# Patient Record
Sex: Male | Born: 1950 | Race: White | Hispanic: No | Marital: Married | Smoking: Never smoker
Health system: Southern US, Community
[De-identification: ages and names within clinical notes are randomized; demographics above are authoritative.]

## PROBLEM LIST (undated history)

## (undated) DIAGNOSIS — R7301 Impaired fasting glucose: Secondary | ICD-10-CM

## (undated) DIAGNOSIS — R339 Retention of urine, unspecified: Secondary | ICD-10-CM

## (undated) DIAGNOSIS — E785 Hyperlipidemia, unspecified: Secondary | ICD-10-CM

## (undated) DIAGNOSIS — Z978 Presence of other specified devices: Secondary | ICD-10-CM

## (undated) DIAGNOSIS — M109 Gout, unspecified: Secondary | ICD-10-CM

## (undated) DIAGNOSIS — Z960 Presence of urogenital implants: Secondary | ICD-10-CM

## (undated) DIAGNOSIS — K635 Polyp of colon: Secondary | ICD-10-CM

## (undated) DIAGNOSIS — I10 Essential (primary) hypertension: Secondary | ICD-10-CM

## (undated) HISTORY — PX: KNEE SURGERY: SHX244

## (undated) HISTORY — DX: Gout, unspecified: M10.9

## (undated) HISTORY — DX: Polyp of colon: K63.5

## (undated) HISTORY — DX: Hyperlipidemia, unspecified: E78.5

## (undated) HISTORY — DX: Essential (primary) hypertension: I10

## (undated) HISTORY — DX: Impaired fasting glucose: R73.01

---

## 2002-02-08 ENCOUNTER — Encounter: Payer: Self-pay | Admitting: Family Medicine

## 2002-02-08 ENCOUNTER — Ambulatory Visit (HOSPITAL_COMMUNITY): Admission: RE | Admit: 2002-02-08 | Discharge: 2002-02-08 | Payer: Self-pay | Admitting: Family Medicine

## 2003-01-09 ENCOUNTER — Ambulatory Visit (HOSPITAL_COMMUNITY): Admission: RE | Admit: 2003-01-09 | Discharge: 2003-01-09 | Payer: Self-pay | Admitting: Family Medicine

## 2003-01-09 ENCOUNTER — Encounter: Payer: Self-pay | Admitting: Family Medicine

## 2003-02-07 ENCOUNTER — Ambulatory Visit (HOSPITAL_BASED_OUTPATIENT_CLINIC_OR_DEPARTMENT_OTHER): Admission: RE | Admit: 2003-02-07 | Discharge: 2003-02-07 | Payer: Self-pay | Admitting: Orthopedic Surgery

## 2005-02-16 ENCOUNTER — Emergency Department (HOSPITAL_COMMUNITY): Admission: EM | Admit: 2005-02-16 | Discharge: 2005-02-16 | Payer: Self-pay | Admitting: Emergency Medicine

## 2006-07-04 DIAGNOSIS — K635 Polyp of colon: Secondary | ICD-10-CM

## 2006-07-04 HISTORY — DX: Polyp of colon: K63.5

## 2007-04-25 ENCOUNTER — Encounter: Payer: Self-pay | Admitting: Internal Medicine

## 2007-04-25 ENCOUNTER — Ambulatory Visit: Payer: Self-pay | Admitting: Internal Medicine

## 2007-04-25 ENCOUNTER — Ambulatory Visit (HOSPITAL_COMMUNITY): Admission: RE | Admit: 2007-04-25 | Discharge: 2007-04-25 | Payer: Self-pay | Admitting: Internal Medicine

## 2010-11-16 NOTE — Op Note (Signed)
NAME:  Miguel Solis, Miguel Solis NO.:  [id number]   MEDICAL RECORD NO.:  [id number]          PATIENT TYPE:  AMB   LOCATION:  DAY                           FACILITY:  [facility]   PHYSICIAN:  R. Roetta Sessions, M.D. DATE OF BIRTH:  January 20, 1951   DATE OF PROCEDURE:  04/25/2007  DATE OF DISCHARGE:                               OPERATIVE REPORT   PROCEDURE PERFORMED:  Screening colonoscopy with biopsy.   INDICATIONS FOR PROCEDURE:  The patient is a 60 year old Caucasian male  devoid of any GI symptoms sent over at the courtesy of Dr. Lilyan Punt  for colorectal cancer screening.  He has never had his lower GI tract  imaged.  There is no family history of colorectal neoplasia.  Colonoscopy is done as a standard screening maneuver.  This approach has  been reviewed with the patient. We talked about the potential risks,  benefits, and alternatives.  I explained to [name] although screening  colonoscopy is an excellent procedure for colorectal cancer screening,  no test is 100% effective in preventing colon cancer.  His questions  were answered.  He is agreeable.  Please see documentation in the  medical record.   PROCEDURE NOTE:  O2 saturation, blood pressure, and pulse were monitored  throughout the entire procedure.  Conscious sedation with Versed 3 mg IV  and Demerol 50 mg IV in divided doses.  Instrument Pentax video chip  system.   FINDINGS:  Digital rectal exam revealed no abnormalities.  The prep was  adequate.  Colon:  Colonic mucosa was surveyed from the rectosigmoid junction  through the left transverse, right colon to the area of the appendiceal  orifice and ileocecal valve to the cecum.  These structures were well  seen and photographed for the record.  From this level, the scope was  slowly withdrawn.  All previously mentioned mucosal surfaces were again  seen. Careful inspection of the colonic mucosa was undertaken with the  tip deflection and fold flattening. The  patient was noted have a 4 mm  polyp in the ascending colon which was cold biopsied/removed.  The  remainder of the colonic mucosa appeared normal.  The scope was pulled  down in the rectum.  A thorough examination of the rectal mucosa  including retroflexion of the anal verge demonstrated no abnormalities.  The patient tolerated the procedure well and was reactive in endoscopy.   IMPRESSION:  1. Normal rectum.  2. Diminutive ascending colon polyp, cold biopsied/removed.  3. The remainder of the colonic mucosa appeared normal.   RECOMMENDATIONS:  1. Follow up path.  2. Further recommendations to follow.      Jonathon Bellows, M.D.  Electronically Signed     RMR/MEDQ  D:  04/25/2007  T:  04/25/2007  Job:  [id number]   cc:   Lorin Picket A. Gerda Diss, MD  Fax: [phone]

## 2010-11-19 NOTE — Op Note (Signed)
NAME:  Miguel Solis, Miguel Solis NO.:  [id number]   MEDICAL RECORD NO.:  [id number]                   PATIENT TYPE:  AMB   LOCATION:  DSC                                  FACILITY:  [facility]   PHYSICIAN:  Harvie Junior, M.D.                DATE OF BIRTH:  04/21/1951   DATE OF PROCEDURE:  02/07/2003  DATE OF DISCHARGE:                                 OPERATIVE REPORT   PREOPERATIVE DIAGNOSIS:  Medial meniscal tear.   POSTOPERATIVE DIAGNOSES:  1. Medial meniscal tear.  2. Chondromalacia of lateral patellar facet.   PRINCIPAL PROCEDURE:  1. Partial posterior horn medial meniscectomy.  2. Debridement of lateral patellar facet.   SURGEON:  Harvie Junior, M.D.   ANESTHESIA:  General.   BRIEF HISTORY:  He is a 60 year old male with a history of having  significant medial side knee pain and anterior knee pain.  He was seen by  Dr. Gerda Diss and felt to have a medial meniscal tear.  MRI was obtained which  showed this, and he was sent down for evaluation.  After discussions about  treatment options, he was ultimately taken to the operating room for  operative knee arthroscopy.   DESCRIPTION OF PROCEDURE:  The patient was taken to the operating room and  after adequate anesthesia was obtained with a general anesthetic, the  patient was placed supine upon the operating table.  The right leg was then  prepped and draped in the usual sterile fashion.  Following this, routine  arthroscopic examination of the knee revealed that there was an obvious  posterior horn medial meniscal tear.  This was debrided back to a smooth and  stable rim.  The remaining meniscus was contoured down with a suction  shaver, and a probe was used to make sure the remaining rim was stable.  The  medial femoral condyle was without significant evidence of chondromalacia  with maybe some grade 2 changes.  Laterally there was one loose body, which  was identified and removed.  Attention was turned  up into the patellofemoral  joint, where there was a significant area of chondromalacia in the lateral  patellar facet, and this was debrided back to a smooth and stable rim.  Attention at this time was turned up into the pouch, where there were  multiple osteocartilaginous loose bodies and cartilaginous fragments.  These  were debrided.  A final check was made for any loose and fragmenting pieces  in the medial and lateral gutters.  Seeing  none, the knee was copiously irrigated and suctioned dry.  The arthroscopic  portals were closed with bandage, a sterile and compressive dressing was  applied, and the patient was taken to recovery, where he was noted to be in  satisfactory condition.  Estimated blood loss for the procedure was none.  Harvie Junior, M.D.    Ranae Plumber  D:  02/07/2003  T:  02/07/2003  Job:  [id number]   cc:   Donna Bernard, M.D.  [address]  [place]  [place] [address]  Fax: [phone]

## 2012-03-22 ENCOUNTER — Encounter: Payer: Self-pay | Admitting: *Deleted

## 2012-04-30 ENCOUNTER — Emergency Department (HOSPITAL_COMMUNITY): Payer: BC Managed Care – PPO

## 2012-04-30 ENCOUNTER — Encounter (HOSPITAL_COMMUNITY): Payer: Self-pay

## 2012-04-30 ENCOUNTER — Emergency Department (HOSPITAL_COMMUNITY)
Admission: EM | Admit: 2012-04-30 | Discharge: 2012-04-30 | Disposition: A | Discharge status: Home / Self Care | Payer: BC Managed Care – PPO | Attending: Emergency Medicine | Admitting: Emergency Medicine

## 2012-04-30 DIAGNOSIS — Y9389 Activity, other specified: Secondary | ICD-10-CM | POA: Insufficient documentation

## 2012-04-30 DIAGNOSIS — Z23 Encounter for immunization: Secondary | ICD-10-CM | POA: Insufficient documentation

## 2012-04-30 DIAGNOSIS — S62202A Unspecified fracture of first metacarpal bone, left hand, initial encounter for closed fracture: Secondary | ICD-10-CM

## 2012-04-30 DIAGNOSIS — S61012A Laceration without foreign body of left thumb without damage to nail, initial encounter: Secondary | ICD-10-CM

## 2012-04-30 DIAGNOSIS — S61209A Unspecified open wound of unspecified finger without damage to nail, initial encounter: Secondary | ICD-10-CM | POA: Insufficient documentation

## 2012-04-30 DIAGNOSIS — S62309A Unspecified fracture of unspecified metacarpal bone, initial encounter for closed fracture: Secondary | ICD-10-CM | POA: Insufficient documentation

## 2012-04-30 DIAGNOSIS — W268XXA Contact with other sharp object(s), not elsewhere classified, initial encounter: Secondary | ICD-10-CM | POA: Insufficient documentation

## 2012-04-30 DIAGNOSIS — Y929 Unspecified place or not applicable: Secondary | ICD-10-CM | POA: Insufficient documentation

## 2012-04-30 MED ORDER — TETANUS-DIPHTH-ACELL PERTUSSIS 5-2.5-18.5 LF-MCG/0.5 IM SUSP
0.5000 mL | Freq: Once | INTRAMUSCULAR | Status: AC
  Administered 2012-04-30: 0.5 mL via INTRAMUSCULAR
  Filled 2012-04-30: qty 0.5

## 2012-04-30 MED ORDER — CEPHALEXIN 500 MG PO CAPS
500.0000 mg | ORAL_CAPSULE | Freq: Once | ORAL | Status: AC
  Administered 2012-04-30: 500 mg via ORAL
  Filled 2012-04-30: qty 1

## 2012-04-30 MED ORDER — CEPHALEXIN 500 MG PO CAPS: 500.0000 mg | ORAL_CAPSULE | Freq: Four times a day (QID) | ORAL | Status: DC

## 2012-04-30 MED ORDER — OXYCODONE-ACETAMINOPHEN 5-325 MG PO TABS: 1.0000 | ORAL_TABLET | ORAL | Status: AC | PRN

## 2012-04-30 MED ORDER — LIDOCAINE HCL (PF) 2 % IJ SOLN
10.0000 mL | Freq: Once | INTRAMUSCULAR | Status: AC
  Administered 2012-04-30: 10 mL
  Filled 2012-04-30 (×2): qty 10

## 2012-04-30 NOTE — ED Notes (Signed)
Dressing applied after left hand and wound cleaned. Splint applied. Pt tolerated well.

## 2012-04-30 NOTE — ED Provider Notes (Signed)
History     CSN: [id number]  Arrival date & time 04/30/12  2130   First MD Initiated Contact with Patient 04/30/12 2027      Chief Complaint  Patient presents with  . Laceration  . Finger Injury    (Consider location/radiation/quality/duration/timing/severity/associated sxs/prior treatment) Patient is a 61 y.o. male presenting with skin laceration. The history is provided by the patient.  Laceration   He was using a cross flow and the strings that says he was trying to load it striking his left thumb causing a laceration. It is unknown his last tetanus immunization was. He denies other injury.  History reviewed. No pertinent past medical history.  Past Surgical History  Procedure Date  . Knee surgery     History reviewed. No pertinent family history.  History  Substance Use Topics  . Smoking status: Never Smoker   . Smokeless tobacco: Not on file  . Alcohol Use: No      Review of Systems  All other systems reviewed and are negative.    Allergies  Review of patient's allergies indicates no known allergies.  Home Medications  No current outpatient prescriptions on file.  BP 151/96  Pulse 96  Temp 98.1 F (36.7 C) (Oral)  Resp 20  Ht 5\' 10"  (1.778 m)  Wt 190 lb (86.183 kg)  BMI 27.26 kg/m2  SpO2 97%  Physical Exam  Nursing note and vitals reviewed. 61 year old male, resting comfortably and in no acute distress. Vital signs are significant for hypertension with blood pressure 151/96. Oxygen saturation is 97%, which is normal. Head is normocephalic and atraumatic. PERRLA, EOMI. Oropharynx is clear. Neck is nontender and supple without adenopathy or JVD. Back is nontender and there is no CVA tenderness. Lungs are clear without rales, wheezes, or rhonchi. Chest is nontender. Heart has regular rate and rhythm without murmur. Abdomen is soft, flat, nontender without masses or hepatosplenomegaly and peristalsis is normoactive. Extremities have no  cyanosis or edema. There is marked swelling of the thenar eminence of the left hand. There's a laceration on the ulnar and dorsal  aspect of the left thumb proximal phalanx. Neurovascular and tendon function are intact. Skin is warm and dry without rash. Neurologic: Mental status is normal, cranial nerves are intact, there are no motor or sensory deficits.   ED Course  SPLINT APPLICATION Date/Time: 04/30/2012 10:05 PM Performed by: Dione Booze Authorized by: Preston Fleeting, Tanysha Quant Consent: Verbal consent obtained. Written consent not obtained. Risks and benefits: risks, benefits and alternatives were discussed Consent given by: patient Patient understanding: patient states understanding of the procedure being performed Patient consent: the patient's understanding of the procedure matches consent given Procedure consent: procedure consent matches procedure scheduled Relevant documents: relevant documents present and verified Test results: test results available and properly labeled Site marked: the operative site was marked Imaging studies: imaging studies available Required items: required blood products, implants, devices, and special equipment available Patient identity confirmed: verbally with patient and arm band Time out: Immediately prior to procedure a "time out" was called to verify the correct patient, procedure, equipment, support staff and site/side marked as required. Location details: left thumb Splint type: velcro splint. Supplies used: velcro splint. Post-procedure: The splinted body part was neurovascularly unchanged following the procedure. Patient tolerance: Patient tolerated the procedure well with no immediate complications.   (including critical care time)  Labs Reviewed - No data to display Dg Finger Thumb Left  04/30/2012  *RADIOLOGY REPORT*  Clinical Data: Crossbow injury, pain, laceration  LEFT THUMB 2+V  Comparison: None.  Findings: Marked soft tissue swelling.   Small dorsal avulsion of the first metacarpal at the MCP joint suspected on the oblique view (arrow).  No dislocation.  Phalanges intact.  IMPRESSION: Small dorsal avulsion fracture of first metacarpal distally suspected.  See comments above.  Marked soft tissue swelling.   Original Report Authenticated By: Elsie Stain, M.D.    LACERATION REPAIR Performed by: Dione Booze Authorized by: Dione Booze Consent: Verbal consent obtained. Risks and benefits: risks, benefits and alternatives were discussed Consent given by: patient Patient identity confirmed: provided demographic data Prepped and Draped in normal sterile fashion Wound explored  Laceration Location: left thumb  Laceration Length: 4.5 cm  No Foreign Bodies seen or palpated, clot removed, no tendon injury  Anesthesia: local infiltration  Local anesthetic: lidocaine 2% without epinephrine - digital block supplemented with local infiltration  Anesthetic total: 12 ml  Irrigation method: syringe Amount of cleaning: standard  Skin closure: close  Number of sutures: 12  Technique: simple interrupted  Patient tolerance: Patient tolerated the procedure well with no immediate complications.   1. Laceration of left thumb   2. Fracture of first metacarpal of left hand       MDM  Laceration left thumb. He is given a DTaP booster and sent for x-ray which shows a probable avulsion of the first metacarpal. He is mildly tender over this area on exam, but the laceration is well removed from the possible fracture. You'll be placed on antibiotics from a prophylactic standpoint, but I do not believe that this is an open fracture. He is referred to the on-call hand specialist for followup after repair of laceration and he was placed in a thumb splint to properly mobilize the possible fracture        Dione Booze, MD 04/30/12 2207

## 2012-04-30 NOTE — ED Notes (Signed)
Cross bow hunting for deer this evening and the string hit it. Laceration to left thumb, bleeding at this time.

## 2012-04-30 NOTE — ED Notes (Signed)
Pt presents with left thumb laceration obtained while using crossbow during hunting. Bleeding controlled at this time as thumb is wrapped in guaze. Left hand cleansed with NS. Wound has clot at site. Clot was not disturbed while cleaning left hand. Pt tolerated cleaning well. Cap refill brisk distal to laceration with good feeling in tip of thumb.  Radial pulses present and equal bilaterally. Pt also has a large ecchymosis on posterior wrist. Pt denies pain in wrist at this time. Tetanus unknown at this time. Xray pending.

## 2012-05-08 MED FILL — Oxycodone w/ Acetaminophen Tab 5-325 MG: ORAL | Qty: 6 | Status: AC

## 2012-10-16 ENCOUNTER — Encounter: Payer: Self-pay | Admitting: Family Medicine

## 2012-10-16 ENCOUNTER — Ambulatory Visit (INDEPENDENT_AMBULATORY_CARE_PROVIDER_SITE_OTHER): Payer: BC Managed Care – PPO | Admitting: Family Medicine

## 2012-10-16 VITALS — BP 158/108 | Temp 98.8°F | Wt 205.0 lb

## 2012-10-16 DIAGNOSIS — J322 Chronic ethmoidal sinusitis: Secondary | ICD-10-CM

## 2012-10-16 MED ORDER — AZITHROMYCIN 250 MG PO TABS: ORAL_TABLET | ORAL | Status: DC

## 2012-10-16 NOTE — Progress Notes (Signed)
  Subjective:    Patient ID: Miguel Solis, male    DOB: [birth year], 62 y.o.   MRN: [id number]  Sinusitis This is a new problem. The current episode started in the past 7 days. The problem has been gradually worsening since onset. His pain is at a severity of 4/10. The pain is moderate. Associated symptoms include congestion, headaches, a hoarse voice, sinus pressure and sneezing. Past treatments include nothing. The treatment provided mild relief.      Review of Systems  HENT: Positive for congestion, hoarse voice, sneezing and sinus pressure.   Neurological: Positive for headaches.   otherwise negative     Objective:   Physical Exam   Alert mild malaise. Vitals reviewed. Frontal maxillary tenderness evident. Pharynx slight erythema Neck supple. Lungs clear. Heart regular in rhythm.    Assessment & Plan:  Impression #1 sinusitis. Plan Z-Pak. Symptomatic care discussed. WSL

## 2012-11-01 ENCOUNTER — Ambulatory Visit (INDEPENDENT_AMBULATORY_CARE_PROVIDER_SITE_OTHER): Payer: BC Managed Care – PPO | Admitting: Nurse Practitioner

## 2012-11-01 ENCOUNTER — Encounter: Payer: Self-pay | Admitting: Nurse Practitioner

## 2012-11-01 VITALS — BP 150/98 | HR 70 | Wt 206.0 lb

## 2012-11-01 DIAGNOSIS — S8012XA Contusion of left lower leg, initial encounter: Secondary | ICD-10-CM

## 2012-11-01 DIAGNOSIS — S8010XA Contusion of unspecified lower leg, initial encounter: Secondary | ICD-10-CM

## 2012-11-01 MED ORDER — NAPROXEN 500 MG PO TABS: 500.0000 mg | ORAL_TABLET | Freq: Two times a day (BID) | ORAL | Status: DC

## 2012-11-01 NOTE — Patient Instructions (Addendum)
Warm moist compresses; elevation;

## 2012-11-01 NOTE — Progress Notes (Signed)
Subjective:  Presents with complaints of swelling on the left lower leg just beneath the patella after turning over his lawnmower over on his leg about a week ago. Tenderness has resolved. Edema much improved. No chest pain or shortness of breath. Has been able to work and do normal activities.  Objective:   BP 150/98  Pulse 70  Wt 206 lb (93.441 kg)  BMI 29.56 kg/m2 NAD. Alert, oriented. Localized firm area of edema beneath the left knee in the pretibial soft tissue just lateral to be tibial. Area is approximately 4 cm in diameter, well-defined with minimal erythema, minimal warmth and nontender to palpation. Lower leg has 1+ pitting edema with some dependent ecchymoses noted around the foot. No tenderness with palpation of the calf or lower leg area. Good ROM of the ankle without tenderness, Homans sign negative. Strong DP pulse. Toes good capillary refill.  Assessment:Contusion of left lower leg, initial encounter  Hematoma of leg, left, initial encounter  Plan: Meds ordered this encounter  Medications  . naproxen (NAPROSYN) 500 MG tablet    Sig: Take 1 tablet (500 mg total) by mouth 2 (two) times daily with a meal. Prn pain    Dispense:  30 tablet    Refill:  0    Order Specific Question:  Supervising Provider    Answer:  Merlyn Albert [[id number]]   warm compresses and elevation. Expect continued gradual resolution of symptoms. Call back early next week if no improvement, sooner if any problems.

## 2012-11-05 ENCOUNTER — Telehealth: Payer: Self-pay | Admitting: Family Medicine

## 2012-11-05 NOTE — Telephone Encounter (Signed)
Please call patient.  How is swelling beneath knee and lower leg?  Any redness or warmth?  Any improvement?

## 2012-11-05 NOTE — Telephone Encounter (Signed)
Patient needs a referral to orthopedic for his knee-make for Wednesday please

## 2012-11-05 NOTE — Telephone Encounter (Signed)
Spoke with [employer].  Left message for patient to call Tuesday for appt for recheck.  Notified Nena Polio.

## 2012-11-05 NOTE — Telephone Encounter (Signed)
Patient stated it not much better and you told him if no better by today to call and you would refer him to ortho

## 2012-11-07 ENCOUNTER — Encounter: Payer: Self-pay | Admitting: *Deleted

## 2012-11-08 ENCOUNTER — Encounter: Payer: Self-pay | Admitting: Family Medicine

## 2012-11-08 ENCOUNTER — Ambulatory Visit (INDEPENDENT_AMBULATORY_CARE_PROVIDER_SITE_OTHER): Payer: BC Managed Care – PPO | Admitting: Family Medicine

## 2012-11-08 VITALS — BP 152/102 | Temp 98.6°F | Wt 205.2 lb

## 2012-11-08 DIAGNOSIS — R7309 Other abnormal glucose: Secondary | ICD-10-CM

## 2012-11-08 DIAGNOSIS — R739 Hyperglycemia, unspecified: Secondary | ICD-10-CM

## 2012-11-08 DIAGNOSIS — E785 Hyperlipidemia, unspecified: Secondary | ICD-10-CM

## 2012-11-08 DIAGNOSIS — Z0000 Encounter for general adult medical examination without abnormal findings: Secondary | ICD-10-CM

## 2012-11-08 DIAGNOSIS — I10 Essential (primary) hypertension: Secondary | ICD-10-CM

## 2012-11-08 DIAGNOSIS — T148XXA Other injury of unspecified body region, initial encounter: Secondary | ICD-10-CM

## 2012-11-08 MED ORDER — LISINOPRIL 5 MG PO TABS: 5.0000 mg | ORAL_TABLET | Freq: Every day | ORAL | Status: DC

## 2012-11-08 NOTE — Patient Instructions (Signed)
Diabetes Meal Planning Guide The diabetes meal planning guide is a tool to help you plan your meals and snacks. It is important for people with diabetes to manage their blood glucose (sugar) levels. Choosing the right foods and the right amounts throughout your day will help control your blood glucose. Eating right can even help you improve your blood pressure and reach or maintain a healthy weight. CARBOHYDRATE COUNTING MADE EASY When you eat carbohydrates, they turn to sugar. This raises your blood glucose level. Counting carbohydrates can help you control this level so you feel better. When you plan your meals by counting carbohydrates, you can have more flexibility in what you eat and balance your medicine with your food intake. Carbohydrate counting simply means adding up the total amount of carbohydrate grams in your meals and snacks. Try to eat about the same amount at each meal. Foods with carbohydrates are listed below. Each portion below is 1 carbohydrate serving or 15 grams of carbohydrates. Ask your dietician how many grams of carbohydrates you should eat at each meal or snack. Grains and Starches  1 slice bread.   English muffin or hotdog/hamburger bun.   cup cold cereal (unsweetened).   cup cooked pasta or rice.   cup starchy vegetables (corn, potatoes, peas, beans, winter squash).  1 tortilla (6 inches).   bagel.  1 waffle or pancake (size of a CD).   cup cooked cereal.  4 to 6 small crackers. *Whole grain is recommended. Fruit  1 cup fresh unsweetened berries, melon, papaya, pineapple.  1 small fresh fruit.   banana or mango.   cup fruit juice (4 oz unsweetened).   cup canned fruit in natural juice or water.  2 tbs dried fruit.  12 to 15 grapes or cherries. Milk and Yogurt  1 cup fat-free or 1% milk.  1 cup soy milk.  6 oz light yogurt with sugar-free sweetener.  6 oz low-fat soy yogurt.  6 oz plain yogurt. Vegetables  1 cup raw or  cup  cooked is counted as 0 carbohydrates or a "free" food.  If you eat 3 or more servings at 1 meal, count them as 1 carbohydrate serving. Other Carbohydrates   oz chips or pretzels.   cup ice cream or frozen yogurt.   cup sherbet or sorbet.  2 inch square cake, no frosting.  1 tbs honey, sugar, jam, jelly, or syrup.  2 small cookies.  3 squares of graham crackers.  3 cups popcorn.  6 crackers.  1 cup broth-based soup.  Count 1 cup casserole or other mixed foods as 2 carbohydrate servings.  Foods with less than 20 calories in a serving may be counted as 0 carbohydrates or a "free" food. You may want to purchase a book or computer software that lists the carbohydrate gram counts of different foods. In addition, the nutrition facts panel on the labels of the foods you eat are a good source of this information. The label will tell you how big the serving size is and the total number of carbohydrate grams you will be eating per serving. Divide this number by 15 to obtain the number of carbohydrate servings in a portion. Remember, 1 carbohydrate serving equals 15 grams of carbohydrate. SERVING SIZES Measuring foods and serving sizes helps you make sure you are getting the right amount of food. The list below tells how big or small some common serving sizes are.  1 oz.........4 stacked dice.  3 oz.........Deck of cards.  1 tsp........Tip   of little finger.  1 tbs......Marland KitchenMarland KitchenThumb.  2 tbs.......Marland KitchenGolf ball.   cup......Marland KitchenHalf of a fist.  1 cup.......Marland KitchenA fist. SAMPLE DIABETES MEAL PLAN Below is a sample meal plan that includes foods from the grain and starches, dairy, vegetable, fruit, and meat groups. A dietician can individualize a meal plan to fit your calorie needs and tell you the number of servings needed from each food group. However, controlling the total amount of carbohydrates in your meal or snack is more important than making sure you include all of the food groups at every  meal. You may interchange carbohydrate containing foods (dairy, starches, and fruits). The meal plan below is an example of a 2000 calorie diet using carbohydrate counting. This meal plan has 17 carbohydrate servings. Breakfast  1 cup oatmeal (2 carb servings).   cup light yogurt (1 carb serving).  1 cup blueberries (1 carb serving).   cup almonds. Snack  1 large apple (2 carb servings).  1 low-fat string cheese stick. Lunch  Chicken breast salad.  1 cup spinach.   cup chopped tomatoes.  2 oz chicken breast, sliced.  2 tbs low-fat [place] dressing.  12 whole-wheat crackers (2 carb servings).  12 to 15 grapes (1 carb serving).  1 cup low-fat milk (1 carb serving). Snack  1 cup carrots.   cup hummus (1 carb serving). Dinner  3 oz broiled salmon.  1 cup brown rice (3 carb servings). Snack  1  cups steamed broccoli (1 carb serving) drizzled with 1 tsp olive oil and lemon juice.  1 cup light pudding (2 carb servings). DIABETES MEAL PLANNING WORKSHEET Your dietician can use this worksheet to help you decide how many servings of foods and what types of foods are right for you.  BREAKFAST Food Group and Servings / Carb Servings Grain/Starches __________________________________ Dairy __________________________________________ Vegetable ______________________________________ Fruit ___________________________________________ Meat __________________________________________ Fat ____________________________________________ LUNCH Food Group and Servings / Carb Servings Grain/Starches ___________________________________ Dairy ___________________________________________ Fruit ____________________________________________ Meat ___________________________________________ Fat _____________________________________________ Laural [place] Food Group and Servings / Carb Servings Grain/Starches ___________________________________ Dairy  ___________________________________________ Fruit ____________________________________________ Meat ___________________________________________ Fat _____________________________________________ SNACKS Food Group and Servings / Carb Servings Grain/Starches ___________________________________ Dairy ___________________________________________ Vegetable _______________________________________ Fruit ____________________________________________ Meat ___________________________________________ Fat _____________________________________________ DAILY TOTALS Starches _________________________ Vegetable ________________________ Fruit ____________________________ Dairy ____________________________ Meat ____________________________ Fat ______________________________ Document Released: 03/17/2005 Document Revised: 09/12/2011 Document Reviewed: 01/26/2009 [employer] Patient Information 2013 [place], [place]. Hypertension As your heart beats, it forces blood through your arteries. This force is your blood pressure. If the pressure is too high, it is called hypertension (HTN) or high blood pressure. HTN is dangerous because you may have it and not know it. High blood pressure may mean that your heart has to work harder to pump blood. Your arteries may be narrow or stiff. The extra work puts you at risk for heart disease, stroke, and other problems.  Blood pressure consists of two numbers, a higher number over a lower, 110/72, for example. It is stated as "110 over 72." The ideal is below 120 for the top number (systolic) and under 80 for the bottom (diastolic). Write down your blood pressure today. You should pay close attention to your blood pressure if you have certain conditions such as:  Heart failure.  Prior heart attack.  Diabetes  Chronic kidney disease.  Prior stroke.  Multiple risk factors for heart disease. To see if you have HTN, your blood pressure should be measured while you are seated  with your arm held at the level of the heart. It  should be measured at least twice. A one-time elevated blood pressure reading (especially in the Emergency Department) does not mean that you need treatment. There may be conditions in which the blood pressure is different between your right and left arms. It is important to see your caregiver soon for a recheck. Most people have essential hypertension which means that there is not a specific cause. This type of high blood pressure may be lowered by changing lifestyle factors such as:  Stress.  Smoking.  Lack of exercise.  Excessive weight.  Drug/tobacco/alcohol use.  Eating less salt. Most people do not have symptoms from high blood pressure until it has caused damage to the body. Effective treatment can often prevent, delay or reduce that damage. TREATMENT  When a cause has been identified, treatment for high blood pressure is directed at the cause. There are a large number of medications to treat HTN. These fall into several categories, and your caregiver will help you select the medicines that are best for you. Medications may have side effects. You should review side effects with your caregiver. If your blood pressure stays high after you have made lifestyle changes or started on medicines,   Your medication(s) may need to be changed.  Other problems may need to be addressed.  Be certain you understand your prescriptions, and know how and when to take your medicine.  Be sure to follow up with your caregiver within the time frame advised (usually within two weeks) to have your blood pressure rechecked and to review your medications.  If you are taking more than one medicine to lower your blood pressure, make sure you know how and at what times they should be taken. Taking two medicines at the same time can result in blood pressure that is too low. SEEK IMMEDIATE MEDICAL CARE IF:  You develop a severe headache, blurred or changing  vision, or confusion.  You have unusual weakness or numbness, or a faint feeling.  You have severe chest or abdominal pain, vomiting, or breathing problems. MAKE SURE YOU:   Understand these instructions.  Will watch your condition.  Will get help right away if you are not doing well or get worse. Document Released: 06/20/2005 Document Revised: 09/12/2011 Document Reviewed: 02/08/2008 [facility] Patient Information 2013 [place], [place].

## 2012-11-08 NOTE — Progress Notes (Signed)
  Subjective:    Patient ID: Miguel Solis, male    DOB: 05/28/1951, 62 y.o.   MRN: [id number]  HPIthis patient comes in today with a big bruise on his left lower leg swollen tender it's actually better than what it was it is going down lawnmower rolled over on him. He is able to walk on it. Denies any calf pain or swelling in the lower legs. In addition to this he also relates that he has noticed his blood pressure being up on several different occasions he denies blurred vision chest pressure pain  Family history hypertensionatient doesn't smoke  Review of Systems See above.    Objective:   Physical Exam Blood pressure was checked the elevated last 2 visits it was elevated Lungs clear no crackles heart is regular no murmurs pulse normal extremities no edema but he has a large bruise 2" x 3" but it appears to be going down some discoloration that's consistent with an absorbing bruise       Assessment & Plan:  Contusion-should gradually go away warm compresses several times a day over the next few weeks Hypertension start lisinopril 5 mg daily side effects and potential for allergy discussed. Check lab work. Patient should followup for a wellness exam within the next few weeks with Dr. Brett Canales.

## 2012-11-09 LAB — HEPATIC FUNCTION PANEL
AST: 50 U/L — ABNORMAL HIGH (ref 0–37)
Bilirubin, Direct: 0.2 mg/dL (ref 0.0–0.3)
Total Bilirubin: 1.1 mg/dL (ref 0.3–1.2)

## 2012-11-09 LAB — BASIC METABOLIC PANEL
BUN: 12 mg/dL (ref 6–23)
Potassium: 4.4 mEq/L (ref 3.5–5.3)
Sodium: 137 mEq/L (ref 135–145)

## 2012-11-09 LAB — LIPID PANEL
LDL Cholesterol: 156 mg/dL — ABNORMAL HIGH (ref 0–99)
Triglycerides: 374 mg/dL — ABNORMAL HIGH (ref <150)
VLDL: 75 mg/dL — ABNORMAL HIGH (ref 0–40)

## 2012-11-09 LAB — HEMOGLOBIN A1C: Hgb A1c MFr Bld: 6.3 % — ABNORMAL HIGH (ref <5.7)

## 2012-12-04 ENCOUNTER — Encounter: Payer: Self-pay | Admitting: Family Medicine

## 2012-12-04 ENCOUNTER — Ambulatory Visit (INDEPENDENT_AMBULATORY_CARE_PROVIDER_SITE_OTHER): Payer: BC Managed Care – PPO | Admitting: Family Medicine

## 2012-12-04 VITALS — BP 158/94 | HR 80 | Ht 68.0 in | Wt 202.0 lb

## 2012-12-04 DIAGNOSIS — Z0000 Encounter for general adult medical examination without abnormal findings: Secondary | ICD-10-CM

## 2012-12-04 DIAGNOSIS — R972 Elevated prostate specific antigen [PSA]: Secondary | ICD-10-CM

## 2012-12-04 DIAGNOSIS — R739 Hyperglycemia, unspecified: Secondary | ICD-10-CM

## 2012-12-04 DIAGNOSIS — R7309 Other abnormal glucose: Secondary | ICD-10-CM

## 2012-12-04 DIAGNOSIS — E785 Hyperlipidemia, unspecified: Secondary | ICD-10-CM

## 2012-12-04 DIAGNOSIS — I10 Essential (primary) hypertension: Secondary | ICD-10-CM

## 2012-12-04 MED ORDER — LISINOPRIL 10 MG PO TABS: 10.0000 mg | ORAL_TABLET | Freq: Every day | ORAL | Status: DC

## 2012-12-04 NOTE — Patient Instructions (Addendum)
Cut the sugars out of your diet! Take bp meds at night.  Take your blood pressure med at night if possibleHypertriglyceridemia  Diet for High blood levels of Triglycerides Most fats in food are triglycerides. Triglycerides in your blood are stored as fat in your body. High levels of triglycerides in your blood may put you at a greater risk for heart disease and stroke.  Normal triglyceride levels are less than 150 mg/dL. Borderline high levels are 150-199 mg/dl. High levels are 200 - 499 mg/dL, and very high triglyceride levels are greater than 500 mg/dL. The decision to treat high triglycerides is generally based on the level. For people with borderline or high triglyceride levels, treatment includes weight loss and exercise. Drugs are recommended for people with very high triglyceride levels. Many people who need treatment for high triglyceride levels have metabolic syndrome. This syndrome is a collection of disorders that often include: insulin resistance, high blood pressure, blood clotting problems, high cholesterol and triglycerides. TESTING PROCEDURE FOR TRIGLYCERIDES  You should not eat 4 hours before getting your triglycerides measured. The normal range of triglycerides is between 10 and 250 milligrams per deciliter (mg/dl). Some people may have extreme levels (1000 or above), but your triglyceride level may be too high if it is above 150 mg/dl, depending on what other risk factors you have for heart disease.  People with high blood triglycerides may also have high blood cholesterol levels. If you have high blood cholesterol as well as high blood triglycerides, your risk for heart disease is probably greater than if you only had high triglycerides. High blood cholesterol is one of the main risk factors for heart disease. CHANGING YOUR DIET  Your weight can affect your blood triglyceride level. If you are more than 20% above your ideal body weight, you may be able to lower your blood  triglycerides by losing weight. Eating less and exercising regularly is the best way to combat this. Fat provides more calories than any other food. The best way to lose weight is to eat less fat. Only 30% of your total calories should come from fat. Less than 7% of your diet should come from saturated fat. A diet low in fat and saturated fat is the same as a diet to decrease blood cholesterol. By eating a diet lower in fat, you may lose weight, lower your blood cholesterol, and lower your blood triglyceride level.  Eating a diet low in fat, especially saturated fat, may also help you lower your blood triglyceride level. Ask your dietitian to help you figure how much fat you can eat based on the number of calories your caregiver has prescribed for you.  Exercise, in addition to helping with weight loss may also help lower triglyceride levels.   Alcohol can increase blood triglycerides. You may need to stop drinking alcoholic beverages.  Too much carbohydrate in your diet may also increase your blood triglycerides. Some complex carbohydrates are necessary in your diet. These may include bread, rice, potatoes, other starchy vegetables and cereals.  Reduce "simple" carbohydrates. These may include pure sugars, candy, honey, and jelly without losing other nutrients. If you have the kind of high blood triglycerides that is affected by the amount of carbohydrates in your diet, you will need to eat less sugar and less high-sugar foods. Your caregiver can help you with this.  Adding 2-4 grams of fish oil (EPA+ DHA) may also help lower triglycerides. Speak with your caregiver before adding any supplements to your regimen. Following the  Diet  Maintain your ideal weight. Your caregivers can help you with a diet. Generally, eating less food and getting more exercise will help you lose weight. Joining a weight control group may also help. Ask your caregivers for a good weight control group in your area.  Eat low-fat  foods instead of high-fat foods. This can help you lose weight too.  These foods are lower in fat. Eat MORE of these:   Dried beans, peas, and lentils.  Egg whites.  Low-fat cottage cheese.  Fish.  Lean cuts of meat, such as round, sirloin, rump, and flank (cut extra fat off meat you fix).  Whole grain breads, cereals and pasta.  Skim and nonfat dry milk.  Low-fat yogurt.  Poultry without the skin.  Cheese made with skim or part-skim milk, such as mozzarella, parmesan, farmers', ricotta, or pot cheese. These are higher fat foods. Eat LESS of these:   Whole milk and foods made from whole milk, such as American, blue, cheddar, monterey jack, and swiss cheese  High-fat meats, such as luncheon meats, sausages, knockwurst, bratwurst, hot dogs, ribs, corned beef, ground pork, and regular ground beef.  Fried foods. Limit saturated fats in your diet. Substituting unsaturated fat for saturated fat may decrease your blood triglyceride level. You will need to read package labels to know which products contain saturated fats.  These foods are high in saturated fat. Eat LESS of these:   Fried pork skins.  Whole milk.  Skin and fat from poultry.  Palm oil.  Butter.  Shortening.  Cream cheese.  Miguel Solis.  Margarines and baked goods made from listed oils.  Vegetable shortenings.  Chitterlings.  Fat from meats.  Coconut oil.  Palm kernel oil.  Lard.  Cream.  Sour cream.  Fatback.  Coffee whiteners and non-dairy creamers made with these oils.  Cheese made from whole milk. Use unsaturated fats (both polyunsaturated and monounsaturated) moderately. Remember, even though unsaturated fats are better than saturated fats; you still want a diet low in total fat.  These foods are high in unsaturated fat:   Canola oil.  Sunflower oil.  Mayonnaise.  Almonds.  Peanuts.  Pine nuts.  Margarines made with these oils.  Safflower oil.  Olive  oil.  Avocados.  Cashews.  Peanut butter.  Sunflower seeds.  Soybean oil.  Peanut oil.  Olives.  Pecans.  Walnuts.  Pumpkin seeds. Avoid sugar and other high-sugar foods. This will decrease carbohydrates without decreasing other nutrients. Sugar in your food goes rapidly to your blood. When there is excess sugar in your blood, your liver may use it to make more triglycerides. Sugar also contains calories without other important nutrients.  Eat LESS of these:   Sugar, brown sugar, powdered sugar, jam, jelly, preserves, honey, syrup, molasses, pies, candy, cakes, cookies, frosting, pastries, colas, soft drinks, punches, fruit drinks, and regular gelatin.  Avoid alcohol. Alcohol, even more than sugar, may increase blood triglycerides. In addition, alcohol is high in calories and low in nutrients. Ask for sparkling water, or a diet soft drink instead of an alcoholic beverage. Suggestions for planning and preparing meals   Bake, broil, grill or roast meats instead of frying.  Remove fat from meats and skin from poultry before cooking.  Add spices, herbs, lemon juice or vinegar to vegetables instead of salt, rich sauces or gravies.  Use a non-stick skillet without fat or use no-stick sprays.  Cool and refrigerate stews and broth. Then remove the hardened fat floating on the surface before  serving.  Refrigerate meat drippings and skim off fat to make low-fat gravies.  Serve more fish.  Use less butter, margarine and other high-fat spreads on bread or vegetables.  Use skim or reconstituted non-fat dry milk for cooking.  Cook with low-fat cheeses.  Substitute low-fat yogurt or cottage cheese for all or part of the sour cream in recipes for sauces, dips or congealed salads.  Use half yogurt/half mayonnaise in salad recipes.  Substitute evaporated skim milk for cream. Evaporated skim milk or reconstituted non-fat dry milk can be whipped and substituted for whipped cream in  certain recipes.  Choose fresh fruits for dessert instead of high-fat foods such as pies or cakes. Fruits are naturally low in fat. When Dining Out   Order low-fat appetizers such as fruit or vegetable juice, pasta with vegetables or tomato sauce.  Select clear, rather than cream soups.  Ask that dressings and gravies be served on the side. Then use less of them.  Order foods that are baked, broiled, poached, steamed, stir-fried, or roasted.  Ask for margarine instead of butter, and use only a small amount.  Drink sparkling water, unsweetened tea or coffee, or diet soft drinks instead of alcohol or other sweet beverages. QUESTIONS AND ANSWERS ABOUT OTHER FATS IN THE BLOOD: SATURATED FAT, TRANS FAT, AND CHOLESTEROL What is trans fat? Trans fat is a type of fat that is formed when vegetable oil is hardened through a process called hydrogenation. This process helps makes foods more solid, gives them shape, and prolongs their shelf life. Trans fats are also called hydrogenated or partially hydrogenated oils.  What do saturated fat, trans fat, and cholesterol in foods have to do with heart disease? Saturated fat, trans fat, and cholesterol in the diet all raise the level of LDL "bad" cholesterol in the blood. The higher the LDL cholesterol, the greater the risk for coronary heart disease (CHD). Saturated fat and trans fat raise LDL similarly.  What foods contain saturated fat, trans fat, and cholesterol? High amounts of saturated fat are found in animal products, such as fatty cuts of meat, chicken skin, and full-fat dairy products like butter, whole milk, cream, and cheese, and in tropical vegetable oils such as palm, palm kernel, and coconut oil. Trans fat is found in some of the same foods as saturated fat, such as vegetable shortening, some margarines (especially hard or stick margarine), crackers, cookies, baked goods, fried foods, salad dressings, and other processed foods made with partially  hydrogenated vegetable oils. Small amounts of trans fat also occur naturally in some animal products, such as milk products, beef, and lamb. Foods high in cholesterol include liver, other organ meats, egg yolks, shrimp, and full-fat dairy products. How can I use the new food label to make heart-healthy food choices? Check the Nutrition Facts panel of the food label. Choose foods lower in saturated fat, trans fat, and cholesterol. For saturated fat and cholesterol, you can also use the Percent Daily Value (%DV): 5% DV or less is low, and 20% DV or more is high. (There is no %DV for trans fat.) Use the Nutrition Facts panel to choose foods low in saturated fat and cholesterol, and if the trans fat is not listed, read the ingredients and limit products that list shortening or hydrogenated or partially hydrogenated vegetable oil, which tend to be high in trans fat. POINTS TO REMEMBER:   Discuss your risk for heart disease with your caregivers, and take steps to reduce risk factors.  Change your  diet. Choose foods that are low in saturated fat, trans fat, and cholesterol.  Add exercise to your daily routine if it is not already being done. Participate in physical activity of moderate intensity, like brisk walking, for at least 30 minutes on most, and preferably all days of the week. No time? Break the 30 minutes into three, 10-minute segments during the day.  Stop smoking. If you do smoke, contact your caregiver to discuss ways in which they can help you quit.  Do not use street drugs.  Maintain a normal weight.  Maintain a healthy blood pressure.  Keep up with your blood work for checking the fats in your blood as directed by your caregiver. Document Released: 04/07/2004 Document Revised: 12/20/2011 Document Reviewed: 11/03/2008 [facility] Patient Information 2014 [place], [place]. Hypertension Information As your heart beats, it forces blood through your arteries. This force is your blood pressure.  If the pressure is too high, it is called hypertension (HTN) or high blood pressure. HTN is dangerous because you may have it and not know it. High blood pressure may mean that your heart has to work harder to pump blood. Your arteries may be narrow or stiff. The extra work puts you at risk for heart disease, stroke, and other problems.  Blood pressure consists of two numbers, a higher number over a lower, 110/72, for example. It is stated as "110 over 72." The ideal is below 120 for the top number (systolic) and under 80 for the bottom (diastolic).  You should pay close attention to your blood pressure if you have certain conditions such as:  Heart failure.   Prior heart attack.   Diabetes   Chronic kidney disease.   Prior stroke.   Multiple risk factors for heart disease.  To see if you have HTN, your blood pressure should be measured while you are seated with your arm held at the level of the heart. It should be measured at least twice. A one-time elevated blood pressure reading (especially in the Emergency Department) does not mean that you need treatment. There may be conditions in which the blood pressure is different between your right and left arms. It is important to see your caregiver soon for a recheck. Most people have essential hypertension which means that there is not a specific cause. This type of high blood pressure may be lowered by changing lifestyle factors such as:  Stress.   Smoking.   Lack of exercise.   Excessive weight.   Drug/tobacco/alcohol use.   Eating less salt.  Most people do not have symptoms from high blood pressure until it has caused damage to the body. Effective treatment can often prevent, delay or reduce that damage. TREATMENT  Treatment for high blood pressure, when a cause has been identified, is directed at the cause. There are a large number of medications to treat HTN. These fall into several categories, and your caregiver will help you  select the medicines that are best for you. Medications may have side effects. You should review side effects with your caregiver. If your blood pressure stays high after you have made lifestyle changes or started on medicines,   Your medication(s) may need to be changed.   Other problems may need to be addressed.   Be certain you understand your prescriptions, and know how and when to take your medicine.   Be sure to follow up with your caregiver within the time frame advised (usually within two weeks) to have your blood pressure rechecked  and to review your medications.   If you are taking more than one medicine to lower your blood pressure, make sure you know how and at what times they should be taken. Taking two medicines at the same time can result in blood pressure that is too low.  Document Released: 08/23/2005 Document Revised: 03/02/2011 Document Reviewed: 08/30/2007 [facility] Patient Information 2012 [place], [place].

## 2012-12-04 NOTE — Progress Notes (Signed)
Subjective:    Patient ID: Miguel Solis, male    DOB: 08-Aug-1950, 62 y.o.   MRN: [id number]  HPI Results for orders placed in visit on 11/08/12  LIPID PANEL      Result Value Range   Cholesterol 269 (*) 0 - 200 mg/dL   Triglycerides 324 (*) <150 mg/dL   HDL 38 (*) >40 mg/dL   Total CHOL/HDL Ratio 7.1     VLDL 75 (*) 0 - 40 mg/dL   LDL Cholesterol 102 (*) 0 - 99 mg/dL  PSA      Result Value Range   PSA 4.26 (*) <=4.00 ng/mL  BASIC METABOLIC PANEL      Result Value Range   Sodium 137  135 - 145 mEq/L   Potassium 4.4  3.5 - 5.3 mEq/L   Chloride 100  96 - 112 mEq/L   CO2 27  19 - 32 mEq/L   Glucose, Bld 132 (*) 70 - 99 mg/dL   BUN 12  6 - 23 mg/dL   Creat 7.25  3.66 - 4.40 mg/dL   Calcium 9.7  8.4 - 34.7 mg/dL  HEPATIC FUNCTION PANEL      Result Value Range   Total Bilirubin 1.1  0.3 - 1.2 mg/dL   Bilirubin, Direct 0.2  0.0 - 0.3 mg/dL   Indirect Bilirubin 0.9  0.0 - 0.9 mg/dL   Alkaline Phosphatase 100  39 - 117 U/L   AST 50 (*) 0 - 37 U/L   ALT 53  0 - 53 U/L   Total Protein 7.6  6.0 - 8.3 g/dL   Albumin 4.1  3.5 - 5.2 g/dL  HEMOGLOBIN Q2V      Result Value Range   Hemoglobin A1C 6.3 (*) <5.7 %   Mean Plasma Glucose 134 (*) <117 mg/dL   Patient arrives office for "wellness exam". Unfortunately, he has several significant health concerns which she has not addressed and a long time.  Patient has history of elevated sugar. He admits to continuing to drink drinks with significant sugar intake in them. He has a history of hyperlipidemia. He started the medication and then stopped it for reasons that are unclear. Patient has a history of elevated blood pressure. He admits to high salt intake and not exercising much. Recently lisinopril 5 mg was initiated handling it well.   Review of Systems  Constitutional: Negative for fever, activity change and appetite change.  HENT: Negative for congestion, rhinorrhea and neck pain.   Eyes: Negative for discharge.  Respiratory:  Negative for cough and wheezing.   Cardiovascular: Negative for chest pain.  Gastrointestinal: Negative for vomiting, abdominal pain and blood in stool.  Genitourinary: Negative for frequency and difficulty urinating.  Skin: Negative for rash.  Allergic/Immunologic: Negative for environmental allergies and food allergies.  Neurological: Negative for weakness and headaches.  Psychiatric/Behavioral: Negative for agitation.       Objective:   Physical Exam  Constitutional: He appears well-developed and well-nourished.  HENT:  Head: Normocephalic and atraumatic.  Right Ear: External ear normal.  Left Ear: External ear normal.  Nose: Nose normal.  Mouth/Throat: Oropharynx is clear and moist.  Eyes: EOM are normal. Pupils are equal, round, and reactive to light.  Neck: Normal range of motion. Neck supple. No thyromegaly present.  Cardiovascular: Normal rate, regular rhythm and normal heart sounds.   No murmur heard. Pulmonary/Chest: Effort normal and breath sounds normal. No respiratory distress. He has no wheezes.  Abdominal: Soft. Bowel sounds are normal. He  exhibits no distension and no mass. There is no tenderness.  Genitourinary: Penis normal.  Musculoskeletal: Normal range of motion. He exhibits no edema.  Lymphadenopathy:    He has no cervical adenopathy.  Neurological: He is alert. He exhibits normal muscle tone.  Skin: Skin is warm and dry. No erythema.  Psychiatric: He has a normal mood and affect. His behavior is normal. Judgment normal.          Assessment & Plan:  Impression 1 wellness exam. #2 hypertension suboptimal in control #3 elevated PSA discussed at length #4 hyperlipidemia with poor diet. #5 impaired fasting glucose #6 history gout plan increase lisinopril to 10 mg. Patient to work hard on diet next 90 days. Will repeat lipid and glucose at that time. Urology consult rationale discussed. WSL

## 2012-12-05 DIAGNOSIS — R972 Elevated prostate specific antigen [PSA]: Secondary | ICD-10-CM | POA: Insufficient documentation

## 2012-12-10 ENCOUNTER — Telehealth: Payer: Self-pay | Admitting: Family Medicine

## 2012-12-10 NOTE — Telephone Encounter (Signed)
Calling to check on referral for colon

## 2012-12-11 NOTE — Telephone Encounter (Signed)
???  we consulted urologist--this is likely what he means. Pt worried about prostate. Ask brend to find out and contact

## 2012-12-14 ENCOUNTER — Other Ambulatory Visit (INDEPENDENT_AMBULATORY_CARE_PROVIDER_SITE_OTHER): Payer: BC Managed Care – PPO | Admitting: *Deleted

## 2012-12-14 DIAGNOSIS — Z0000 Encounter for general adult medical examination without abnormal findings: Secondary | ICD-10-CM

## 2012-12-14 LAB — POC HEMOCCULT BLD/STL (HOME/3-CARD/SCREEN)
Card #3 Fecal Occult Blood, POC: NEGATIVE
Fecal Occult Blood, POC: NEGATIVE

## 2012-12-18 NOTE — Telephone Encounter (Signed)
Appt 01/03/13 @ 10:15am w/Dr. Margarita Grizzle @ [place] office, mailed letter to notify pt  [address] [place], [address], [place]  Ph# [phone], Fx# [phone]

## 2012-12-27 ENCOUNTER — Other Ambulatory Visit: Payer: Self-pay | Admitting: *Deleted

## 2012-12-27 MED ORDER — LISINOPRIL 10 MG PO TABS: 10.0000 mg | ORAL_TABLET | Freq: Every day | ORAL | Status: DC

## 2013-01-10 ENCOUNTER — Ambulatory Visit (INDEPENDENT_AMBULATORY_CARE_PROVIDER_SITE_OTHER): Payer: BC Managed Care – PPO | Admitting: Family Medicine

## 2013-01-10 ENCOUNTER — Encounter: Payer: Self-pay | Admitting: Family Medicine

## 2013-01-10 VITALS — BP 140/90 | HR 80 | Wt 200.0 lb

## 2013-01-10 DIAGNOSIS — N400 Enlarged prostate without lower urinary tract symptoms: Secondary | ICD-10-CM | POA: Insufficient documentation

## 2013-01-10 MED ORDER — TAMSULOSIN HCL 0.4 MG PO CAPS: 0.4000 mg | ORAL_CAPSULE | Freq: Every day | ORAL | Status: DC

## 2013-01-10 NOTE — Progress Notes (Signed)
  Subjective:    Patient ID: Miguel Solis, male    DOB: [birth year], 62 y.o.   MRN: [id number]  HPI Followed up with specialist.  Repeat psa down to 2. Originally they had scheduled a visit.  Up twice per night to urinate. No increased freq, noct times two.  Patient has been experiencing nocturia for the past year. Complicated by swing shifts.   Review of Systems No dysuria no abdominal pain no vomiting.    Objective:   Physical Exam  Alert no acute distress. Lungs clear. Heart regular in rhythm. HEENT normal.      Assessment & Plan:  Impression prostate hypertrophy discussed at length. Plan trial of Flomax 0.4

## 2013-03-07 ENCOUNTER — Ambulatory Visit: Payer: BC Managed Care – PPO | Admitting: Family Medicine

## 2013-03-19 ENCOUNTER — Ambulatory Visit (INDEPENDENT_AMBULATORY_CARE_PROVIDER_SITE_OTHER): Payer: BC Managed Care – PPO | Admitting: Family Medicine

## 2013-03-19 ENCOUNTER — Encounter: Payer: Self-pay | Admitting: Family Medicine

## 2013-03-19 VITALS — BP 140/90 | Temp 99.1°F | Ht 70.0 in | Wt 196.2 lb

## 2013-03-19 DIAGNOSIS — J329 Chronic sinusitis, unspecified: Secondary | ICD-10-CM

## 2013-03-19 MED ORDER — AZITHROMYCIN 250 MG PO TABS: ORAL_TABLET | ORAL | Status: AC

## 2013-03-19 NOTE — Progress Notes (Signed)
  Subjective:    Patient ID: Miguel Solis, male    DOB: 1950/12/08, 62 y.o.   MRN: [id number]  Sinusitis This is a new problem. The current episode started yesterday. The problem is unchanged. Maximum temperature: 99.1. The fever has been present for less than 1 day. His pain is at a severity of 7/10. The pain is moderate. Associated symptoms include congestion, coughing, headaches and sinus pressure. (Myalgias) Past treatments include lying down. The treatment provided no relief.   Some exposure. Pain in head sharp frontal comesa dn goesno maj radiation  otc meds Low fever      Review of Systems  HENT: Positive for congestion and sinus pressure.   Respiratory: Positive for cough.   Neurological: Positive for headaches.   ROS otherwise negative     Objective:   Physical Exam Alert hydration good moderate malaise. Vitals reviewed. Frontal maxillary tenderness. Neck supple. Lungs clear heart regular in rhythm       Assessment & Plan:  Impression rhinosinusitis plan antibiotics prescribed. Symptomatic care discussed.

## 2013-03-19 NOTE — Patient Instructions (Signed)
Get some rest next few days

## 2013-05-09 ENCOUNTER — Other Ambulatory Visit: Payer: Self-pay

## 2014-11-21 ENCOUNTER — Encounter: Payer: Self-pay | Admitting: Family Medicine

## 2014-11-21 ENCOUNTER — Ambulatory Visit (INDEPENDENT_AMBULATORY_CARE_PROVIDER_SITE_OTHER): Payer: BLUE CROSS/BLUE SHIELD | Admitting: Family Medicine

## 2014-11-21 VITALS — BP 140/90 | Temp 98.3°F | Ht 70.0 in | Wt 200.1 lb

## 2014-11-21 DIAGNOSIS — N400 Enlarged prostate without lower urinary tract symptoms: Secondary | ICD-10-CM | POA: Diagnosis not present

## 2014-11-21 DIAGNOSIS — E785 Hyperlipidemia, unspecified: Secondary | ICD-10-CM | POA: Diagnosis not present

## 2014-11-21 DIAGNOSIS — I10 Essential (primary) hypertension: Secondary | ICD-10-CM | POA: Diagnosis not present

## 2014-11-21 MED ORDER — AMOXICILLIN-POT CLAVULANATE 875-125 MG PO TABS: 1.0000 | ORAL_TABLET | Freq: Two times a day (BID) | ORAL | Status: AC

## 2014-11-21 MED ORDER — PREDNISONE 20 MG PO TABS: ORAL_TABLET | ORAL | Status: DC

## 2014-11-21 MED ORDER — LISINOPRIL 10 MG PO TABS: 10.0000 mg | ORAL_TABLET | Freq: Every day | ORAL | Status: DC

## 2014-11-21 MED ORDER — TAMSULOSIN HCL 0.4 MG PO CAPS: 0.4000 mg | ORAL_CAPSULE | Freq: Every day | ORAL | Status: DC

## 2014-11-21 NOTE — Progress Notes (Signed)
   Subjective:    Patient ID: Miguel Solis, male    DOB: [birth year], 64 y.o.   MRN: [id number]  Sinusitis This is a new problem. The current episode started in the past 7 days. The problem has been gradually worsening since onset. There has been no fever. The pain is moderate. Associated symptoms include congestion, coughing and sinus pressure. Past treatments include nothing. The treatment provided no relief.   Patient states that he has no other concerns at this time.  Cough yellow prod and dim energy   Patient also has history of hypertension. Has stopped the medication. Needs to started again. Was handling it well. Was helping his blood pressure well.  History prostate hypertrophy. Notes the Flomax deftly helps urination. Requests a refill on that also.  History of gout. Asked for a prescription of prednisone have on hand when he goes on a deep-seated fishing trip. Last year flared up under similar scenario  Review of Systems  HENT: Positive for congestion and sinus pressure.   Respiratory: Positive for cough.    no chest pain no abdominal pain no change in bowel habits blood in stool     Objective:   Physical Exam Alert vitals stable HET moderate his congestion frontal tenderness blood pressure still able to lungs clear. Heart rare rhythm. Abdomen benign. Ankles without edema       Assessment & Plan:  Impression 1 hypertension control suboptimal in with noncompliance #2 prostate hypertrophy discussed #3 history gout discussed #4 acute rhinosinusitis plan appropriate antibiotics. Other medications refilled. Diet exercise discussed. Strongly recommend screening physical later this summer. WSL

## 2015-08-06 ENCOUNTER — Ambulatory Visit (INDEPENDENT_AMBULATORY_CARE_PROVIDER_SITE_OTHER): Payer: BLUE CROSS/BLUE SHIELD | Admitting: Family Medicine

## 2015-08-06 ENCOUNTER — Encounter: Payer: Self-pay | Admitting: Family Medicine

## 2015-08-06 VITALS — BP 142/90 | Ht 70.0 in | Wt 197.4 lb

## 2015-08-06 DIAGNOSIS — M25561 Pain in right knee: Secondary | ICD-10-CM

## 2015-08-06 MED ORDER — TAMSULOSIN HCL 0.4 MG PO CAPS: 0.4000 mg | ORAL_CAPSULE | Freq: Every day | ORAL | Status: DC

## 2015-08-06 MED ORDER — LISINOPRIL 10 MG PO TABS: 10.0000 mg | ORAL_TABLET | Freq: Every day | ORAL | Status: DC

## 2015-08-06 NOTE — Progress Notes (Signed)
   Subjective:    Patient ID: Miguel Solis, male    DOB: January 19, 1951, 65 y.o.   MRN: [id number]  Knee Pain  The incident occurred more than 1 week ago. There was no injury mechanism. The pain is present in the right knee. The quality of the pain is described as aching. The pain is at a severity of 6/10. The symptoms are aggravated by movement and weight bearing. He has tried NSAIDs (Advil) for the symptoms. The treatment provided mild relief.   Last few weeks knee has been hurting  Hx of torn meniscus yrs ago, maybe that knee  Patient notes substantial pain swelling and tenderness. He would prefer not to take a lot of inflammatories. Besides exam help much. Requesting injection Patient states no other concerns this visit.  Review of Systems No headache no chest pain no abdominal pain    Objective:   Physical Exam   alert vitals stable HET normal lungs clear heart rhythm    knee somewhat swollen right knee moderate crepitations no point tenderness  Patient was prepped draped anesthetized and injected 1 mL Depo-Medrol 2 mL Xylocaine      Assessment & Plan:  Impression injection of inflamed knee plan x-ray knee. Symptom care discussed WSL

## 2015-08-07 ENCOUNTER — Ambulatory Visit (HOSPITAL_COMMUNITY)
Admission: RE | Admit: 2015-08-07 | Discharge: 2015-08-07 | Disposition: A | Discharge status: Home / Self Care | Payer: BLUE CROSS/BLUE SHIELD | Source: Ambulatory Visit | Attending: Family Medicine | Admitting: Family Medicine

## 2015-08-07 DIAGNOSIS — M1711 Unilateral primary osteoarthritis, right knee: Secondary | ICD-10-CM | POA: Diagnosis not present

## 2015-08-07 DIAGNOSIS — M25561 Pain in right knee: Secondary | ICD-10-CM | POA: Diagnosis not present

## 2015-08-09 MED ORDER — METHYLPREDNISOLONE ACETATE 40 MG/ML IJ SUSP: 40.0000 mg | Freq: Once | INTRAMUSCULAR | Status: DC

## 2015-08-12 NOTE — Progress Notes (Signed)
lodine 400 one bid with food prn numb 30 two ref

## 2015-08-28 ENCOUNTER — Other Ambulatory Visit: Payer: Self-pay | Admitting: *Deleted

## 2015-08-28 MED ORDER — TAMSULOSIN HCL 0.4 MG PO CAPS: 0.4000 mg | ORAL_CAPSULE | Freq: Every day | ORAL | Status: DC

## 2015-08-28 MED ORDER — LISINOPRIL 10 MG PO TABS: 10.0000 mg | ORAL_TABLET | Freq: Every day | ORAL | Status: DC

## 2015-09-01 ENCOUNTER — Encounter: Payer: Self-pay | Admitting: Family Medicine

## 2015-09-01 ENCOUNTER — Ambulatory Visit (INDEPENDENT_AMBULATORY_CARE_PROVIDER_SITE_OTHER): Payer: BLUE CROSS/BLUE SHIELD | Admitting: Family Medicine

## 2015-09-01 VITALS — BP 130/82 | Ht 70.0 in | Wt 194.0 lb

## 2015-09-01 DIAGNOSIS — J111 Influenza due to unidentified influenza virus with other respiratory manifestations: Secondary | ICD-10-CM | POA: Diagnosis not present

## 2015-09-01 MED ORDER — OSELTAMIVIR PHOSPHATE 75 MG PO CAPS: 75.0000 mg | ORAL_CAPSULE | Freq: Two times a day (BID) | ORAL | Status: DC

## 2015-09-01 NOTE — Progress Notes (Signed)
   Subjective:    Patient ID: Miguel Solis, male    DOB: [birth year], 65 y.o.   MRN: [id number]  Influenza This is a new problem. The current episode started yesterday. Associated symptoms include abdominal pain, congestion, a fever, myalgias and a sore throat. Associated symptoms comments: Runny nose, Wheezing. Nothing aggravates the symptoms. He has tried nothing for the symptoms.   Patient states no other concerns this visit. Fever and achey  Head diffuse  occas cough  Energy  Appetite low   achey all over, trying to drink fluids  Review of Systems  Constitutional: Positive for fever.  HENT: Positive for congestion and sore throat.   Gastrointestinal: Positive for abdominal pain.  Musculoskeletal: Positive for myalgias.       Objective:   Physical Exam   alert moderate malaise positive fever. Vitals stable H&T moderate nasal congestion pharynx normal occasional deep cough lungs clear no crackles no tachypnea heart regular rate and rhythm.      Assessment & Plan:   impression acute influenza discussed plan symptom care warning signs discussed antibiotics prescribed WSL

## 2016-01-07 ENCOUNTER — Encounter: Payer: Self-pay | Admitting: Family Medicine

## 2016-01-07 ENCOUNTER — Ambulatory Visit (INDEPENDENT_AMBULATORY_CARE_PROVIDER_SITE_OTHER): Payer: BLUE CROSS/BLUE SHIELD | Admitting: Family Medicine

## 2016-01-07 VITALS — BP 136/88 | Ht 70.0 in | Wt 191.0 lb

## 2016-01-07 DIAGNOSIS — S8012XA Contusion of left lower leg, initial encounter: Secondary | ICD-10-CM

## 2016-01-07 NOTE — Progress Notes (Signed)
   Subjective:    Patient ID: Miguel Solis, male    DOB: May 08, 1951, 65 y.o.   MRN: [id number]  HPI  Patient arrives with c/o left leg injury. Hit leg with tool box last wednesday  Not limping. No fever no chills   Impressive swelling front part of leg pain no calf pain  Review of Systems No headache, no major weight loss or weight gain, no chest pain no back pain abdominal pain no change in bowel habits complete ROS otherwise negative     Objective:   Physical Exam  Alert lungs clear heart rare rhythm left leg knee good range of motion negative calf tenderness impressive sub-ostial anterior tibial hematoma      Assessment & Plan:  Impression sub-ostial hematoma plan expect slow resolution no x-rays rationale discussed

## 2016-10-05 ENCOUNTER — Ambulatory Visit (INDEPENDENT_AMBULATORY_CARE_PROVIDER_SITE_OTHER): Payer: Medicare Other | Admitting: Family Medicine

## 2016-10-05 ENCOUNTER — Other Ambulatory Visit: Payer: Self-pay | Admitting: *Deleted

## 2016-10-05 ENCOUNTER — Encounter: Payer: Self-pay | Admitting: Family Medicine

## 2016-10-05 VITALS — Ht 70.0 in | Wt 190.2 lb

## 2016-10-05 DIAGNOSIS — I10 Essential (primary) hypertension: Secondary | ICD-10-CM

## 2016-10-05 DIAGNOSIS — E7849 Other hyperlipidemia: Secondary | ICD-10-CM

## 2016-10-05 DIAGNOSIS — E784 Other hyperlipidemia: Secondary | ICD-10-CM

## 2016-10-05 DIAGNOSIS — R300 Dysuria: Secondary | ICD-10-CM | POA: Diagnosis not present

## 2016-10-05 DIAGNOSIS — N419 Inflammatory disease of prostate, unspecified: Secondary | ICD-10-CM

## 2016-10-05 DIAGNOSIS — R739 Hyperglycemia, unspecified: Secondary | ICD-10-CM

## 2016-10-05 DIAGNOSIS — R972 Elevated prostate specific antigen [PSA]: Secondary | ICD-10-CM | POA: Diagnosis not present

## 2016-10-05 DIAGNOSIS — N400 Enlarged prostate without lower urinary tract symptoms: Secondary | ICD-10-CM

## 2016-10-05 LAB — POCT URINALYSIS DIPSTICK
PH UA: 6 (ref 5.0–8.0)
Spec Grav, UA: 1.01 (ref 1.030–1.035)

## 2016-10-05 MED ORDER — CIPROFLOXACIN HCL 500 MG PO TABS: 500.0000 mg | ORAL_TABLET | Freq: Two times a day (BID) | ORAL | 0 refills | Status: DC

## 2016-10-05 MED ORDER — SILDENAFIL CITRATE 100 MG PO TABS: 50.0000 mg | ORAL_TABLET | Freq: Every day | ORAL | 3 refills | Status: DC | PRN

## 2016-10-05 MED ORDER — TAMSULOSIN HCL 0.4 MG PO CAPS: 0.4000 mg | ORAL_CAPSULE | Freq: Every day | ORAL | 0 refills | Status: DC

## 2016-10-05 MED ORDER — NAPROXEN 500 MG PO TABS: 500.0000 mg | ORAL_TABLET | Freq: Two times a day (BID) | ORAL | 0 refills | Status: DC

## 2016-10-05 MED ORDER — LISINOPRIL 10 MG PO TABS: 10.0000 mg | ORAL_TABLET | Freq: Every day | ORAL | 0 refills | Status: DC

## 2016-10-05 NOTE — Progress Notes (Signed)
   Subjective:    Patient ID: Miguel Solis, male    DOB: 02-27-1951, 66 y.o.   MRN: [id number]  HPI Patient arrives with painful urination and having to strain for several days. Patient thinks he has a kidney stone. The patient states he's had some discomfort with urination and straining over the past several days. He thought he had a kidney stone but he denies any flank pain abdominal pain sweats chills high fever vomiting denies diarrhea rectal bleeding PMH benign except for several years ago he had elevated PSA which upon follow-up was normal in does have BPH he is ran out of his medicines  Review of Systems Please see above    Objective:   Physical Exam  Lungs clear no crackles respiratory rate is normal heart is regular pulse normal abdomen soft no guarding rebound or tenderness prostate exam shows enlarged prostate soft yet tender urinalysis overall negative      Assessment & Plan:  Prostatitis Antibiotics for 3 weeks He should do his lab work within 4-6 weeks with follow-up Dr. Richardson Landry If high fevers hematuria severe pain or worse follow-up Call us if any problems

## 2016-10-10 ENCOUNTER — Telehealth: Payer: Self-pay | Admitting: Family Medicine

## 2016-10-10 NOTE — Telephone Encounter (Signed)
Patient was seen on 10/05/16 and given Rx for Viagra by Dr. Nicki Reaper.  His insurance is not covering this medication, and would like Rx for something cheaper that Dr. Nicki Reaper mentioned.   [facility]

## 2016-10-10 NOTE — Telephone Encounter (Signed)
Sildenafil 20 at [facility], disc with pt, wis

## 2016-10-10 NOTE — Telephone Encounter (Signed)
Need directions and quanity please

## 2016-10-11 MED ORDER — SILDENAFIL CITRATE 20 MG PO TABS: ORAL_TABLET | ORAL | 5 refills | Status: DC

## 2016-10-11 NOTE — Telephone Encounter (Signed)
Prescription sent electronically to [facility]. Patient notified.

## 2016-10-11 NOTE — Telephone Encounter (Signed)
Numb 50 20 mg two or three two hrs prior to sex, ref times 6

## 2016-10-17 ENCOUNTER — Telehealth: Payer: Self-pay | Admitting: Family Medicine

## 2016-10-17 NOTE — Telephone Encounter (Signed)
Usually intermittent slight twitching sporadic and brief has no obvious cause and no sig concern

## 2016-10-17 NOTE — Telephone Encounter (Signed)
Spoke with patient and patient stated that he has had neck twitching and occur only once for about 1 minute.States he did not have any difficulty breathing during the course of spasms or any difficulty swallowing. Has concerns as to what may have caused it.

## 2016-10-17 NOTE — Telephone Encounter (Signed)
Left message return call 10/17/16

## 2016-10-17 NOTE — Telephone Encounter (Signed)
Left message return call 10/17/2016

## 2016-10-17 NOTE — Telephone Encounter (Signed)
Pt called stating that periodically the muscles in his throat twitch. Pt states that it only does it for a min or two and stops. Pt is wondering if this is something he should be concerned about. Please advise.

## 2016-10-18 NOTE — Telephone Encounter (Signed)
Spoke with patient and informed her per Dr.Steve Luking- Usually intermittent slight twitching sporadic and brief has no obvious cause and no significant concern. Patient verbalized understanding

## 2016-11-02 DIAGNOSIS — R972 Elevated prostate specific antigen [PSA]: Secondary | ICD-10-CM | POA: Diagnosis not present

## 2016-11-02 DIAGNOSIS — I10 Essential (primary) hypertension: Secondary | ICD-10-CM | POA: Diagnosis not present

## 2016-11-02 DIAGNOSIS — R739 Hyperglycemia, unspecified: Secondary | ICD-10-CM | POA: Diagnosis not present

## 2016-11-02 DIAGNOSIS — E784 Other hyperlipidemia: Secondary | ICD-10-CM | POA: Diagnosis not present

## 2016-11-03 LAB — HEPATIC FUNCTION PANEL
ALBUMIN: 3.9 g/dL (ref 3.6–4.8)
ALT: 27 IU/L (ref 0–44)
AST: 66 IU/L — ABNORMAL HIGH (ref 0–40)
Alkaline Phosphatase: 125 IU/L — ABNORMAL HIGH (ref 39–117)
BILIRUBIN TOTAL: 1.6 mg/dL — AB (ref 0.0–1.2)
BILIRUBIN, DIRECT: 0.41 mg/dL — AB (ref 0.00–0.40)
TOTAL PROTEIN: 7.4 g/dL (ref 6.0–8.5)

## 2016-11-03 LAB — BASIC METABOLIC PANEL
BUN / CREAT RATIO: 8 — AB (ref 10–24)
BUN: 9 mg/dL (ref 8–27)
CO2: 27 mmol/L (ref 18–29)
CREATININE: 1.14 mg/dL (ref 0.76–1.27)
Calcium: 9.4 mg/dL (ref 8.6–10.2)
Chloride: 97 mmol/L (ref 96–106)
GFR calc Af Amer: 77 mL/min/{1.73_m2} (ref >59)
GFR, EST NON AFRICAN AMERICAN: 67 mL/min/{1.73_m2} (ref >59)
Glucose: 129 mg/dL — ABNORMAL HIGH (ref 65–99)
POTASSIUM: 3.9 mmol/L (ref 3.5–5.2)
SODIUM: 140 mmol/L (ref 134–144)

## 2016-11-03 LAB — LIPID PANEL
Chol/HDL Ratio: 5 ratio (ref 0.0–5.0)
Cholesterol, Total: 277 mg/dL — ABNORMAL HIGH (ref 100–199)
HDL: 55 mg/dL (ref >39)
LDL Calculated: 193 mg/dL — ABNORMAL HIGH (ref 0–99)
Triglycerides: 143 mg/dL (ref 0–149)
VLDL Cholesterol Cal: 29 mg/dL (ref 5–40)

## 2016-11-03 LAB — PSA: PROSTATE SPECIFIC AG, SERUM: 5.6 ng/mL — AB (ref 0.0–4.0)

## 2016-11-16 ENCOUNTER — Ambulatory Visit: Payer: Medicare Other | Admitting: Family Medicine

## 2016-11-17 ENCOUNTER — Encounter: Payer: Self-pay | Admitting: Family Medicine

## 2016-11-17 ENCOUNTER — Ambulatory Visit (INDEPENDENT_AMBULATORY_CARE_PROVIDER_SITE_OTHER): Payer: Medicare Other | Admitting: Family Medicine

## 2016-11-17 VITALS — BP 126/78 | Ht 70.0 in | Wt 186.0 lb

## 2016-11-17 DIAGNOSIS — N400 Enlarged prostate without lower urinary tract symptoms: Secondary | ICD-10-CM | POA: Diagnosis not present

## 2016-11-17 DIAGNOSIS — R972 Elevated prostate specific antigen [PSA]: Secondary | ICD-10-CM | POA: Diagnosis not present

## 2016-11-17 DIAGNOSIS — R7301 Impaired fasting glucose: Secondary | ICD-10-CM

## 2016-11-17 DIAGNOSIS — I10 Essential (primary) hypertension: Secondary | ICD-10-CM | POA: Diagnosis not present

## 2016-11-17 DIAGNOSIS — R739 Hyperglycemia, unspecified: Secondary | ICD-10-CM

## 2016-11-17 LAB — POCT GLYCOSYLATED HEMOGLOBIN (HGB A1C): HEMOGLOBIN A1C: 5.4

## 2016-11-17 MED ORDER — ATORVASTATIN CALCIUM 20 MG PO TABS: 20.0000 mg | ORAL_TABLET | Freq: Every day | ORAL | 1 refills | Status: DC

## 2016-11-17 MED ORDER — LISINOPRIL 10 MG PO TABS: 10.0000 mg | ORAL_TABLET | Freq: Every day | ORAL | 1 refills | Status: DC

## 2016-11-17 NOTE — Progress Notes (Signed)
   Subjective:    Patient ID: Miguel Solis, male    DOB: [birth year], 66 y.o.   MRN: [id number] Patient arrives office for numerous concerns HPIFollow up bloodwork results. Elevated PSA, elevated cholesterol, and elevated blood sugar.   Results for orders placed or performed in visit on 11/17/16  POCT glycosylated hemoglobin (Hb A1C)  Result Value Ref Range   Hemoglobin A1C 5.4     Was having trouble urinating. Finished cipro. Still taking flomax. Pt states he is not having any problems urinating now.   Pt states no concerns today.   Results for orders placed or performed in visit on 11/17/16  POCT glycosylated hemoglobin (Hb A1C)  Result Value Ref Range   Hemoglobin A1C 5.4    Pt states flomaxDefinitely helps. No obvious side effects. Definitely would like to stay on it.  Pt notes protonix. Definitely helps symptoms. Without it has major difficulties.   Blood pressure medicine and blood pressure levels reviewed today with patient. Compliant with blood pressure medicine. States does not miss a dose. No obvious side effects. Blood pressure generally good when checked elsewhere. Watching salt intake.  Alcohol intake some on occasion, no iquor, occas two three beers per wk   History of prior elevated cholesterol. Trying to watch diet. Realizes not doing the best.  History of elevated PSA. When on this urologist. Follow-up blood work revealed normalization numbers    Review of Systems No headache, no major weight loss or weight gain, no chest pain no back pain abdominal pain no change in bowel habits complete ROS otherwise negative     Objective:   Physical Exam Alert and oriented, vitals reviewed and stable, NAD ENT-TM's and ext canals WNL bilat via otoscopic exam Soft palate, tonsils and post pharynx WNL via oropharyngeal exam Neck-symmetric, no masses; thyroid nonpalpable and nontender Pulmonary-no tachypnea or accessory muscle use; Clear without wheezes via  auscultation Card--no abnrml murmurs, rhythm reg and rate WNL Carotid pulses symmetric, without bruits        Assessment & Plan:  Impression 1 hyperlipidemia long discussion held after further discussion patient would like to press on with medicine. We'll initiate Lipitor 20 daily at bedtime #2 history of elevated sugar discussed work on cutting down #3 prostate hypertrophy clinically stable meds #4 hypertension good control discussed maintain same meds #5 elevated PSA. Patient reports in the past came back down after time would like to trial more blood test before going to urologist which is reasonable. Repeat PSA total and free in several weeks. Follow-up in 6 months

## 2016-12-19 DIAGNOSIS — R7301 Impaired fasting glucose: Secondary | ICD-10-CM | POA: Diagnosis not present

## 2016-12-20 LAB — PSA, TOTAL AND FREE
PROSTATE SPECIFIC AG, SERUM: 3.3 ng/mL (ref 0.0–4.0)
PSA, Free Pct: 20.3 %
PSA, Free: 0.67 ng/mL

## 2016-12-26 ENCOUNTER — Encounter: Payer: Self-pay | Admitting: Family Medicine

## 2016-12-26 ENCOUNTER — Ambulatory Visit (INDEPENDENT_AMBULATORY_CARE_PROVIDER_SITE_OTHER): Payer: Medicare Other | Admitting: Family Medicine

## 2016-12-26 VITALS — BP 136/84 | Ht 70.0 in | Wt 185.2 lb

## 2016-12-26 DIAGNOSIS — R972 Elevated prostate specific antigen [PSA]: Secondary | ICD-10-CM | POA: Diagnosis not present

## 2016-12-26 DIAGNOSIS — E784 Other hyperlipidemia: Secondary | ICD-10-CM | POA: Diagnosis not present

## 2016-12-26 DIAGNOSIS — I10 Essential (primary) hypertension: Secondary | ICD-10-CM

## 2016-12-26 DIAGNOSIS — E7849 Other hyperlipidemia: Secondary | ICD-10-CM

## 2016-12-26 NOTE — Progress Notes (Signed)
   Subjective:    Patient ID: Miguel Solis, male    DOB: December 26, 1950, 66 y.o.   MRN: [id number]  HPI  Patient arrives to follow up on PSA.  PSA 3.3 on repeat 12/19/16, psa now back in to normal level  Hx of prostatitisOccurred soon before the blood work. Next  Handling Lipitor well. No obvious side effects. Exercising quite a bit.       Review of Systems No chest pain no back pain no change in urinary habits    Objective:   Physical Exam Alert vitals stable, NAD. Blood pressure good on repeat. HEENT normal. Lungs clear. Heart regular rate and rhythm.        Assessment & Plan:  Impression elevated PSA resolved discussed no need for further testing at this time #2 hyperlipidemia. Recently initiated Lipitor. Handling well need to check blood work in 2 months. Plan recheck in 6 months plus wellness plus chronic at that time.

## 2016-12-31 ENCOUNTER — Other Ambulatory Visit: Payer: Self-pay | Admitting: Family Medicine

## 2017-03-02 ENCOUNTER — Ambulatory Visit (INDEPENDENT_AMBULATORY_CARE_PROVIDER_SITE_OTHER): Payer: Medicare Other | Admitting: Family Medicine

## 2017-03-02 ENCOUNTER — Encounter: Payer: Self-pay | Admitting: Family Medicine

## 2017-03-02 VITALS — BP 150/96 | Ht 70.0 in | Wt 178.0 lb

## 2017-03-02 DIAGNOSIS — G8929 Other chronic pain: Secondary | ICD-10-CM | POA: Diagnosis not present

## 2017-03-02 DIAGNOSIS — F1010 Alcohol abuse, uncomplicated: Secondary | ICD-10-CM | POA: Diagnosis not present

## 2017-03-02 DIAGNOSIS — E784 Other hyperlipidemia: Secondary | ICD-10-CM | POA: Diagnosis not present

## 2017-03-02 DIAGNOSIS — I10 Essential (primary) hypertension: Secondary | ICD-10-CM | POA: Diagnosis not present

## 2017-03-02 DIAGNOSIS — M25561 Pain in right knee: Secondary | ICD-10-CM | POA: Diagnosis not present

## 2017-03-02 MED ORDER — ETODOLAC 400 MG PO TABS: 400.0000 mg | ORAL_TABLET | Freq: Two times a day (BID) | ORAL | 1 refills | Status: DC
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
## 2017-03-03 LAB — LIPID PANEL
CHOLESTEROL TOTAL: 279 mg/dL — AB (ref 100–199)
Chol/HDL Ratio: 5.7 ratio — ABNORMAL HIGH (ref 0.0–5.0)
HDL: 49 mg/dL (ref >39)
LDL Calculated: 189 mg/dL — ABNORMAL HIGH (ref 0–99)
Triglycerides: 205 mg/dL — ABNORMAL HIGH (ref 0–149)
VLDL Cholesterol Cal: 41 mg/dL — ABNORMAL HIGH (ref 5–40)

## 2017-03-03 LAB — HEPATIC FUNCTION PANEL
ALK PHOS: 130 IU/L — AB (ref 39–117)
ALT: 36 IU/L (ref 0–44)
AST: 110 IU/L — AB (ref 0–40)
Albumin: 4.1 g/dL (ref 3.6–4.8)
BILIRUBIN TOTAL: 0.8 mg/dL (ref 0.0–1.2)
BILIRUBIN, DIRECT: 0.27 mg/dL (ref 0.00–0.40)
Total Protein: 8.5 g/dL (ref 6.0–8.5)

## 2017-03-06 ENCOUNTER — Encounter: Payer: Self-pay | Admitting: Family Medicine

## 2017-04-02 ENCOUNTER — Emergency Department (HOSPITAL_COMMUNITY): Payer: Medicare Other

## 2017-04-02 ENCOUNTER — Encounter (HOSPITAL_COMMUNITY): Payer: Self-pay | Admitting: *Deleted

## 2017-04-02 ENCOUNTER — Emergency Department (HOSPITAL_COMMUNITY)
Admission: EM | Admit: 2017-04-02 | Discharge: 2017-04-03 | Disposition: A | Discharge status: Home / Self Care | Payer: Medicare Other | Attending: Emergency Medicine | Admitting: Emergency Medicine

## 2017-04-02 DIAGNOSIS — W0110XA Fall on same level from slipping, tripping and stumbling with subsequent striking against unspecified object, initial encounter: Secondary | ICD-10-CM | POA: Insufficient documentation

## 2017-04-02 DIAGNOSIS — Z79899 Other long term (current) drug therapy: Secondary | ICD-10-CM | POA: Diagnosis not present

## 2017-04-02 DIAGNOSIS — F10120 Alcohol abuse with intoxication, uncomplicated: Secondary | ICD-10-CM | POA: Diagnosis not present

## 2017-04-02 DIAGNOSIS — Y939 Activity, unspecified: Secondary | ICD-10-CM | POA: Insufficient documentation

## 2017-04-02 DIAGNOSIS — Y999 Unspecified external cause status: Secondary | ICD-10-CM | POA: Insufficient documentation

## 2017-04-02 DIAGNOSIS — S0101XA Laceration without foreign body of scalp, initial encounter: Secondary | ICD-10-CM | POA: Insufficient documentation

## 2017-04-02 DIAGNOSIS — Y906 Blood alcohol level of 120-199 mg/100 ml: Secondary | ICD-10-CM | POA: Diagnosis not present

## 2017-04-02 DIAGNOSIS — S0990XA Unspecified injury of head, initial encounter: Secondary | ICD-10-CM | POA: Diagnosis not present

## 2017-04-02 DIAGNOSIS — F10920 Alcohol use, unspecified with intoxication, uncomplicated: Secondary | ICD-10-CM

## 2017-04-02 DIAGNOSIS — Y929 Unspecified place or not applicable: Secondary | ICD-10-CM | POA: Insufficient documentation

## 2017-04-02 DIAGNOSIS — S0003XA Contusion of scalp, initial encounter: Secondary | ICD-10-CM | POA: Diagnosis not present

## 2017-04-02 DIAGNOSIS — F1010 Alcohol abuse, uncomplicated: Secondary | ICD-10-CM

## 2017-04-02 LAB — RAPID URINE DRUG SCREEN, HOSP PERFORMED
Amphetamines: NOT DETECTED
Barbiturates: NOT DETECTED
Benzodiazepines: NOT DETECTED
COCAINE: NOT DETECTED
OPIATES: NOT DETECTED
Tetrahydrocannabinol: NOT DETECTED

## 2017-04-02 LAB — CBC WITH DIFFERENTIAL/PLATELET
Basophils Absolute: 0.1 10*3/uL (ref 0.0–0.1)
Basophils Relative: 1 %
EOS ABS: 0.4 10*3/uL (ref 0.0–0.7)
EOS PCT: 5 %
HCT: 52.2 % — ABNORMAL HIGH (ref 39.0–52.0)
HEMOGLOBIN: 18.6 g/dL — AB (ref 13.0–17.0)
LYMPHS ABS: 2.6 10*3/uL (ref 0.7–4.0)
LYMPHS PCT: 39 %
MCH: 39.7 pg — AB (ref 26.0–34.0)
MCHC: 35.6 g/dL (ref 30.0–36.0)
MCV: 111.3 fL — AB (ref 78.0–100.0)
MONOS PCT: 8 %
Monocytes Absolute: 0.5 10*3/uL (ref 0.1–1.0)
Neutro Abs: 3.2 10*3/uL (ref 1.7–7.7)
Neutrophils Relative %: 48 %
PLATELETS: 214 10*3/uL (ref 150–400)
RBC: 4.69 MIL/uL (ref 4.22–5.81)
RDW: 14.1 % (ref 11.5–15.5)
WBC: 6.7 10*3/uL (ref 4.0–10.5)

## 2017-04-02 LAB — COMPREHENSIVE METABOLIC PANEL
ALK PHOS: 104 U/L (ref 38–126)
ALT: 20 U/L (ref 17–63)
ANION GAP: 11 (ref 5–15)
AST: 55 U/L — ABNORMAL HIGH (ref 15–41)
Albumin: 3.5 g/dL (ref 3.5–5.0)
BUN: 5 mg/dL — ABNORMAL LOW (ref 6–20)
CALCIUM: 8.5 mg/dL — AB (ref 8.9–10.3)
CO2: 26 mmol/L (ref 22–32)
CREATININE: 0.74 mg/dL (ref 0.61–1.24)
Chloride: 102 mmol/L (ref 101–111)
Glucose, Bld: 156 mg/dL — ABNORMAL HIGH (ref 65–99)
Potassium: 3.2 mmol/L — ABNORMAL LOW (ref 3.5–5.1)
SODIUM: 139 mmol/L (ref 135–145)
TOTAL PROTEIN: 8.1 g/dL (ref 6.5–8.1)
Total Bilirubin: 1 mg/dL (ref 0.3–1.2)

## 2017-04-02 LAB — ACETAMINOPHEN LEVEL: Acetaminophen (Tylenol), Serum: <10 ug/mL — ABNORMAL LOW (ref 10–30)

## 2017-04-02 LAB — SALICYLATE LEVEL

## 2017-04-02 LAB — ETHANOL: ALCOHOL ETHYL (B): 169 mg/dL — AB (ref <10)

## 2017-04-02 MED ORDER — LORAZEPAM 2 MG/ML IJ SOLN: 0.0000 mg | Freq: Two times a day (BID) | INTRAMUSCULAR | Status: DC

## 2017-04-02 MED ORDER — THIAMINE HCL 100 MG/ML IJ SOLN: 100.0000 mg | Freq: Every day | INTRAMUSCULAR | Status: DC

## 2017-04-02 MED ORDER — LORAZEPAM 2 MG/ML IJ SOLN: 0.0000 mg | Freq: Four times a day (QID) | INTRAMUSCULAR | Status: DC

## 2017-04-02 MED ORDER — TAMSULOSIN HCL 0.4 MG PO CAPS
0.4000 mg | ORAL_CAPSULE | Freq: Every day | ORAL | Status: DC
  Administered 2017-04-02 – 2017-04-03 (×2): 0.4 mg via ORAL
  Filled 2017-04-02 (×2): qty 1

## 2017-04-02 MED ORDER — HYDROGEN PEROXIDE 3 % EX SOLN
CUTANEOUS | Status: AC
  Administered 2017-04-02: 07:00:00
  Filled 2017-04-02: qty 473

## 2017-04-02 MED ORDER — LORAZEPAM 1 MG PO TABS: 0.0000 mg | ORAL_TABLET | Freq: Four times a day (QID) | ORAL | Status: DC

## 2017-04-02 MED ORDER — LORAZEPAM 1 MG PO TABS: 0.0000 mg | ORAL_TABLET | Freq: Two times a day (BID) | ORAL | Status: DC

## 2017-04-02 MED ORDER — VITAMIN B-1 100 MG PO TABS
100.0000 mg | ORAL_TABLET | Freq: Every day | ORAL | Status: DC
  Administered 2017-04-02 – 2017-04-03 (×2): 100 mg via ORAL
  Filled 2017-04-02 (×2): qty 1

## 2017-04-02 MED ORDER — ATORVASTATIN CALCIUM 20 MG PO TABS
20.0000 mg | ORAL_TABLET | Freq: Every day | ORAL | Status: DC
  Administered 2017-04-02 – 2017-04-03 (×2): 20 mg via ORAL
  Filled 2017-04-02 (×4): qty 1

## 2017-04-02 MED ORDER — IBUPROFEN 400 MG PO TABS
400.0000 mg | ORAL_TABLET | Freq: Once | ORAL | Status: AC
Start: 2017-04-02 — End: 2017-04-02
  Administered 2017-04-02: 400 mg via ORAL
  Filled 2017-04-02: qty 1

## 2017-04-02 MED ORDER — LISINOPRIL 10 MG PO TABS
10.0000 mg | ORAL_TABLET | Freq: Every day | ORAL | Status: DC
  Administered 2017-04-02 – 2017-04-03 (×2): 10 mg via ORAL
  Filled 2017-04-02 (×2): qty 1

## 2017-04-02 MED ORDER — LIDOCAINE-EPINEPHRINE (PF) 1 %-1:200000 IJ SOLN
20.0000 mL | Freq: Once | INTRAMUSCULAR | Status: AC
  Administered 2017-04-02: 20 mL
  Filled 2017-04-02: qty 30

## 2017-04-02 NOTE — BH Assessment (Signed)
TTS staffed patient with Kirkland Hun, FNP, who feels like patient needs to be admitted for Psych Inpatient.  Will seek placement for patient.

## 2017-04-02 NOTE — Progress Notes (Signed)
Patient's new vitals have been faxed to [facility] for review. Call back number for TTS [phone] and [facility] RN Lattie Haw [phone] provided to [facility] for any change in decision.  Verlon Setting, MSW, LCSWA Clinical social worker in [place], [place] Office

## 2017-04-02 NOTE — ED Notes (Addendum)
Pt's BP 143/74, HR 96. Pt is alert and oriented x 4. Pt denies blurry vision, dizziness, headache at this time. Pt has been ambulatory independently with steady gait. Pt denies pain. Pt has no complaints. Pt was given his normal daily BP medication of Lisinopril 10mg  PO at 0949 this morning.

## 2017-04-02 NOTE — ED Triage Notes (Signed)
Pt arrived to er with [facility] for medical clearance, pt reports that he fell tonight after drinking two bottles of wine hitting his head in the bathroom, denies any LOC, pt has laceration noted to left side of head, bleeding controlled, per pt tetanus is up to date, pt denies any SI or HI but according to IVC paperwork that was taken out by pt's wife pt has daily drinking problem and made the statement that he wanted to kill himself tonight. Pt face flushed, cooperative,

## 2017-04-02 NOTE — BH Assessment (Addendum)
Tele Assessment Note   Patient Name: Miguel Solis MRN: [id number] Referring Physician: [place] Location of Patient: [facility] Location of Provider: [facility] Solis is an 66 y.o. male who was brought to the hospital IVC after drinking two bottles of wine and falling and hitting his head requiring staples.  Patient states that he recently retired after working for 38 years.  Patient states that he has been drinking on average 2-3 beers 2-3 times weekly.  Patient states: "I just drank too much last night."  He denies having any any suicidal thoughts as reported and states that he has never had any past attempts to hurt himself.  Patient states that he has no previous treatment history.  He states that he has no history of HI or psychosis.  Patient states that he is agreeable to seeking outpatient treatment and AA Meetings, but does not feel like he needs to be hospitalized and states that he can contract for safety. He did give verbal permission to talk to his wife, the petitioner.   Patient presents as pleasant, alert and oriented.  He is cooperative during the interview states that he has no sleep and appetite disturbance.  Collateral information: Spoke with patient's wife who states that patient has recently retired and has been drinking at least five days a week.  She states that after he fell last night that he did not want to go to the hospital and told her if she made him go to the hospital that he would kill himself.  His wife does state that he has guns in his home, but states that he did not make any statements about using them.  When asked if the patient ever made comments about being suicidal when he Is sober or if he has ever made any attempts to harm himself in the past, she said no.  She did report that he is having some conflict with his brother who lives across the street, but she does not know the reason for the conflict. Wife states that he needs help with his drinking  and he promised his MD that he would seek help for himself a few weeks ago, but has not followed through.  She feels like he needs help with his alcohol problem.    Diagnosis: Alcohol Use Disorder Severe  Past Medical History:  Past Medical History:  Diagnosis Date  . Colon polyp 2008  . Gout   . Hyperlipidemia   . Hypertension   . Impaired fasting glucose     Past Surgical History:  Procedure Laterality Date  . KNEE SURGERY    . KNEE SURGERY Right     Family History:  Family History  Problem Relation Age of Onset  . Cancer Mother     Social History:  reports that he has never smoked. He has never used smokeless tobacco. He reports that he drinks alcohol. He reports that he does not use drugs.  Additional Social History:  Alcohol / Drug Use Pain Medications: denies Prescriptions: denies Over the Counter: denies History of alcohol / drug use?: Yes Longest period of sobriety (when/how long): patient states that he can go a couple weeks without drinking Negative Consequences of Use: Personal relationships Withdrawal Symptoms:  (denies) Substance #1 Name of Substance 1: alcohol 1 - Age of First Use: 24 1 - Amount (size/oz): drinks a couple drinks twice weekly 1 - Frequency: twice weekly 1 - Duration: since age 66 1 - Last Use / Amount:  drank two bottles of wine last night  CIWA: CIWA-Ar BP: (!) 153/94 Pulse Rate: 92 Nausea and Vomiting: no nausea and no vomiting Tactile Disturbances: none Tremor: no tremor Auditory Disturbances: not present Paroxysmal Sweats: no sweat visible Visual Disturbances: not present Anxiety: mildly anxious Headache, Fullness in Head: none present Agitation: normal activity Orientation and Clouding of Sensorium: oriented and can do serial additions CIWA-Ar Total: 1 COWS:    PATIENT STRENGTHS: (choose at least two) Communication skills Supportive family/friends  Allergies: No Known Allergies  Home Medications:  (Not in a  hospital admission)  OB/GYN Status:  No LMP for male patient.  General Assessment Data Location of Assessment: [facility] TTS Assessment: In system Is this a Tele or Face-to-Face Assessment?: Tele Assessment Marital status: Married [place] name:  (N/A) Is patient pregnant?: No Pregnancy Status: No Living Arrangements: Spouse/significant other Can pt return to current living arrangement?: Yes Admission Status: Involuntary Is patient capable of signing voluntary admission?: Yes Referral Source: MD Insurance type:  Physicist, medical)  Medical Screening Exam ([place]) Medical Exam completed: Yes  Crisis Care Plan Living Arrangements: Spouse/significant other Legal Guardian: Other: (none) Name of Psychiatrist:  (none) Name of Therapist:  (none)  Education Status Is patient currently in school?: No Current Grade:  (N/A) Highest grade of school patient has completed:  (12th) Name of school:  (unknown) Contact person:  (none)  Risk to self with the past 6 months Suicidal Ideation: Yes-Currently Present (Told wife he would kill himself if he had to go to hospital) Has patient been a risk to self within the past 6 months prior to admission? : No Suicidal Intent: Yes-Currently Present Has patient had any suicidal intent within the past 6 months prior to admission? : No Is patient at risk for suicide?:  (only when drinking) Suicidal Plan?: No (no plan, but has guns in the home) Has patient had any suicidal plan within the past 6 months prior to admission? : No Access to Means: Yes Specify Access to Suicidal Means:  (has guns in the home) What has been your use of drugs/alcohol within the last 12 months?:  (wife states that he is drinking almost daily) Previous Attempts/Gestures: No How many times?:  (none) Other Self Harm Risks:  (conflict with brother) Triggers for Past Attempts: Other (Comment) (no prior attempts) Intentional Self Injurious Behavior: None Family Suicide History:  Unknown Recent stressful life event(s):  (Recent retirement) Persecutory voices/beliefs?: No Depression: No Depression Symptoms:  (none reported) Substance abuse history and/or treatment for substance abuse?: No Suicide prevention information given to non-admitted patients: Not applicable  Risk to Others within the past 6 months Homicidal Ideation: No Does patient have any lifetime risk of violence toward others beyond the six months prior to admission? : No Thoughts of Harm to Others: No Current Homicidal Intent: No Current Homicidal Plan: No Access to Homicidal Means: No Identified Victim:  (none) History of harm to others?: No Assessment of Violence: None Noted Violent Behavior Description:  (none) Does patient have access to weapons?: Yes (Comment) (has guns) Criminal Charges Pending?: No Does patient have a court date: No Is patient on probation?: No  Psychosis Hallucinations: None noted Delusions: None noted  Mental Status Report Appearance/Hygiene: Disheveled Eye Contact: Good Motor Activity: Unremarkable Speech: Unremarkable Level of Consciousness: Alert Mood: Apprehensive Affect: Appropriate to circumstance Anxiety Level: Minimal Thought Processes: Coherent, Relevant Judgement: Impaired Orientation: Person, Place, Time, Situation Obsessive Compulsive Thoughts/Behaviors: None  Cognitive Functioning Concentration: Normal Memory: Recent Intact, Remote Intact  IQ: Average Insight: Poor Impulse Control: Fair Appetite: Good Weight Loss:  (none) Weight Gain:  (none) Sleep: No Change Total Hours of Sleep:  (9) Vegetative Symptoms: None  ADLScreening [facility] Assessment Services) Patient's cognitive ability adequate to safely complete daily activities?: Yes Patient able to express need for assistance with ADLs?: Yes Independently performs ADLs?: Yes (appropriate for developmental age)  Prior Inpatient Therapy Prior Inpatient Therapy: No Prior Therapy Dates:   (none) Prior Therapy Facilty/Provider(s):  (none) Reason for Treatment:  (no prior treatment)  Prior Outpatient Therapy Prior Outpatient Therapy: No Prior Therapy Dates:  (none) Prior Therapy Facilty/Provider(s):  (none) Reason for Treatment:  (N/A) Does patient have an ACCT team?: No Does patient have Intensive In-House Services?  : No Does patient have [facility] services? : No Does patient have P4CC services?: No  ADL Screening (condition at time of admission) Patient's cognitive ability adequate to safely complete daily activities?: Yes Is the patient deaf or have difficulty hearing?: No Does the patient have difficulty seeing, even when wearing glasses/contacts?: No Does the patient have difficulty concentrating, remembering, or making decisions?: No Patient able to express need for assistance with ADLs?: Yes Does the patient have difficulty dressing or bathing?: No Independently performs ADLs?: Yes (appropriate for developmental age) Does the patient have difficulty walking or climbing stairs?: No Weakness of Legs: None Weakness of Arms/Hands: None       Abuse/Neglect Assessment (Assessment to be complete while patient is alone) Physical Abuse: Denies Verbal Abuse: Denies Sexual Abuse: Denies Exploitation of patient/patient's resources: Denies Self-Neglect: Denies Values / Beliefs Cultural Requests During Hospitalization: None Spiritual Requests During Hospitalization: None Consults Spiritual Care Consult Needed: No Social Work Consult Needed: No Regulatory affairs officer (For Healthcare) Does Patient Have a Medical Advance Directive?: No    Additional Information 1:1 In Past 12 Months?: No CIRT Risk:  (no) Elopement Risk: No Does patient have medical clearance?: Yes     Disposition: TTS staffed patient with Kirkland Hun, FNP, who feels like patient needs to be admitted for Psych Inpatient.  Will seek placement for patient. Disposition Initial Assessment Completed  for this Encounter: Yes Disposition of Patient: Other dispositions Other disposition(s):  (will staff with FNP for final disposition.)  This service was provided via telemedicine using a 2-way, interactive audio and video technology.  Names of all persons participating in this telemedicine service and their role in this encounter. Name:Collie Wernick Role: TTS [place]  Name: Miguel Solis Role: patient  Judeth Porch Edelyn Heidel 04/02/2017 8:12 AM

## 2017-04-02 NOTE — Progress Notes (Signed)
Patient was accepted at [facility]. Attending physician is Dr. Lendon Colonel. Room# TBD ETA: after 15:30.  Call report at [phone].  [facility] RN Lattie Haw was informed.  Verlon Setting, MSW, LCSWA Clinical social worker in [place], [place] Office

## 2017-04-02 NOTE — Progress Notes (Signed)
Per Carolyne Fiscal at [facility], patient's referral is being now reviewed with the doctor.  Writer awaiting call back from [employer].  Verlon Setting, MSW, [place] Clinical social worker in disposition

## 2017-04-02 NOTE — Progress Notes (Signed)
Writer received call from [place] at [place] and informing that their physician reported that due patient's fall, staples, and due to recent BP 150/76, patient is too acute, would be a 1:1 and is declined at [facility] at this time.  [facility] RN Lattie Haw aware.  Verlon Setting, MSW, LCSWA Clinical social worker in [place], [place] Office

## 2017-04-02 NOTE — Progress Notes (Signed)
Carolyne Fiscal with [facility] requested current vitals. This Probation officer faxed patient's current vitals.  Verlon Setting, MSW, LCSWA Clinical social worker in [place], [place] Office

## 2017-04-02 NOTE — Progress Notes (Signed)
Patient was referred to inpatient treatment at the following facilities: [facility] - per [facility] - per [facility] - per [facility] - per Yvetta Coder, unit is back to programming, it is  no longer under contrustion [facility] - for waitlist [facility] - per Octavia Bruckner, 1 geriatric bed [facility] - per Linus Orn, some beds open  Mayer Camel - Medical laboratory scientific officer for [facility] inquiring of bed availability today. However, referral was faxed to this facility.   At capacity: [facility] per [place], [facility] per [place], [facility] per [facility].   CSW in disposition will continue to seek bed.  Verlon Setting, MSW, [place] Clinical social worker in [facility], [place] Office [phone] and [phone] 04/02/2017 11:10 AM

## 2017-04-02 NOTE — Progress Notes (Signed)
Patient is being reviewed at [facility]  Patient's IVC paperwork has been faxed to the both facilities.  Writer will continue to follow up for placement.  Verlon Setting, MSW, [place] Clinical social worker in [facility], [place] Office [phone] and [phone] 04/02/2017 2:28 PM

## 2017-04-02 NOTE — Progress Notes (Signed)
Patient is being now reviewed at [facility].  Verlon Setting, MSW, [place] Clinical social worker in disposition

## 2017-04-02 NOTE — ED Provider Notes (Signed)
Patient accepted to [facility]. Dr. Lendon Colonel is accepting. Patient has remained stable.    Sherwood Gambler, MD 04/02/17 [phone]

## 2017-04-02 NOTE — ED Provider Notes (Signed)
[facility] DEPT Provider Note   CSN: [id number] Arrival date & time: 04/02/17  0457     History   Chief Complaint Chief Complaint  Patient presents with  . Fall  . V70.1    HPI Miguel Solis is a 66 y.o. male.  Patient brought in by police with IVC paperwork. Patient's states that he "fell tonight after drinking too much". He admits to drinking 2 bottles of wine and fell in the bathroom around 8 PM. He apparently refused to have his head wound addressed and patient's wife filled out IVC paperwork. He denies any suicidal or homicidal thoughts currently. IVC paperwork states patient has made statements of wanting to kill himself tonight. Patient states he does not drink every day. States tonight he drank more than usual. Complains of slight headache. Denies any focal weakness, numbness or tingling. Denies any vomiting. Denies using other drugs. Denies any suicidal or homicidal thoughts. He denies any other injury. No neck or back pain.   The history is provided by the patient and the police.  Fall  Associated symptoms include headaches. Pertinent negatives include no chest pain and no shortness of breath.    Past Medical History:  Diagnosis Date  . Colon polyp 2008  . Gout   . Hyperlipidemia   . Hypertension   . Impaired fasting glucose     Patient Active Problem List   Diagnosis Date Noted  . Prostate hypertrophy 01/10/2013  . Elevated PSA 12/05/2012  . Hyperlipemia 11/08/2012  . Essential hypertension, benign 11/08/2012  . Hyperglycemia 11/08/2012    Past Surgical History:  Procedure Laterality Date  . KNEE SURGERY    . KNEE SURGERY Right        Home Medications    Prior to Admission medications   Medication Sig Start Date End Date Taking? Authorizing Provider  atorvastatin (LIPITOR) 20 MG tablet Take 1 tablet (20 mg total) by mouth daily. 11/17/16  Yes [name], MD  etodolac (LODINE) 400 MG tablet Take 1 tablet (400 mg total) by mouth 2 (two)  times daily. Take with food 03/02/17  Yes [name], MD  lisinopril (PRINIVIL,ZESTRIL) 10 MG tablet Take 1 tablet (10 mg total) by mouth daily. 11/17/16 07/18/20 Yes [name], MD  naproxen (NAPROSYN) 500 MG tablet Take 1 tablet (500 mg total) by mouth 2 (two) times daily with a meal. Prn pain 10/05/16  Yes Kathyrn Drown, MD  sildenafil (REVATIO) 20 MG tablet 2 or 3 tablets 2 hours prior to sex 10/11/16  Yes [name], MD  sildenafil (VIAGRA) 100 MG tablet Take 0.5-1 tablets (50-100 mg total) by mouth daily as needed for erectile dysfunction. 10/05/16  Yes Luking, Elayne Snare, MD  tamsulosin (FLOMAX) 0.4 MG CAPS capsule TAKE 1 CAPSULE (0.4 MG TOTAL) BY MOUTH DAILY. 01/02/17  Yes Kathyrn Drown, MD    Family History Family History  Problem Relation Age of Onset  . Cancer Mother     Social History Social History  Substance Use Topics  . Smoking status: Never Smoker  . Smokeless tobacco: Never Used  . Alcohol use Yes     Allergies   Patient has no known allergies.   Review of Systems Review of Systems  Constitutional: Negative for activity change, appetite change and fever.  Respiratory: Negative for cough and shortness of breath.   Cardiovascular: Negative for chest pain.  Gastrointestinal: Negative for abdominal distention, nausea and vomiting.  Genitourinary: Negative for dysuria.  Skin: Positive for  wound.  Neurological: Positive for headaches.  Psychiatric/Behavioral: Negative for confusion and suicidal ideas. The patient is not nervous/anxious.    all other systems are negative except as noted in the HPI and PMH.    Physical Exam Updated Vital Signs BP (!) 150/105   Pulse 92   Temp 98.2 F (36.8 C) (Oral)   Resp 20   Ht 5\' 11"  (1.803 m)   Wt 83.9 kg (185 lb)   SpO2 95%   BMI 25.80 kg/m   Physical Exam  Constitutional: He is oriented to person, place, and time. He appears well-developed and well-nourished. No distress.  Disheveled, oriented x3.    HENT:  Head: Normocephalic and atraumatic.  Mouth/Throat: Oropharynx is clear and moist. No oropharyngeal exudate.  Hematoma to left parietal scalp with dried blood. Underlying there is a 4 cm laceration that is hemostatic  Eyes: Pupils are equal, round, and reactive to light. Conjunctivae and EOM are normal.  Neck: Normal range of motion. Neck supple.  No C spine tenderness.  Cardiovascular: Normal rate, regular rhythm, normal heart sounds and intact distal pulses.   No murmur heard. Pulmonary/Chest: Effort normal and breath sounds normal. No respiratory distress. He exhibits no tenderness.  Abdominal: Soft. There is no tenderness. There is no rebound and no guarding.  Musculoskeletal: Normal range of motion. He exhibits no edema or tenderness.  Neurological: He is alert and oriented to person, place, and time. No cranial nerve deficit. He exhibits normal muscle tone. Coordination normal.   5/5 strength throughout. CN 2-12 intact.Equal grip strength.   Skin: Skin is warm. Capillary refill takes less than 2 seconds.  Psychiatric: He has a normal mood and affect. His behavior is normal.  Nursing note and vitals reviewed.    ED Treatments / Results  Labs (all labs ordered are listed, but only abnormal results are displayed) Labs Reviewed  CBC WITH DIFFERENTIAL/PLATELET - Abnormal; Notable for the following:       Result Value   Hemoglobin 18.6 (*)    HCT 52.2 (*)    MCV 111.3 (*)    MCH 39.7 (*)    All other components within normal limits  COMPREHENSIVE METABOLIC PANEL - Abnormal; Notable for the following:    Potassium 3.2 (*)    Glucose, Bld 156 (*)    BUN 5 (*)    Calcium 8.5 (*)    AST 55 (*)    All other components within normal limits  ETHANOL - Abnormal; Notable for the following:    Alcohol, Ethyl (B) 169 (*)    All other components within normal limits  ACETAMINOPHEN LEVEL - Abnormal; Notable for the following:    Acetaminophen (Tylenol), Serum <10 (*)    All  other components within normal limits  SALICYLATE LEVEL  RAPID URINE DRUG SCREEN, HOSP PERFORMED    EKG  EKG Interpretation None       Radiology No results found.  Procedures .Marland KitchenLaceration Repair Date/Time: 04/02/2017 6:09 AM Performed by: Ezequiel Essex Authorized by: Ezequiel Essex   Consent:    Consent obtained:  Verbal   Consent given by:  Patient   Risks discussed:  Infection, pain, retained foreign body and need for additional repair Anesthesia (see MAR for exact dosages):    Anesthesia method:  Local infiltration   Local anesthetic:  Lidocaine 2% WITH epi Laceration details:    Location:  Scalp   Scalp location:  L parietal   Length (cm):  4 Repair type:    Repair type:  Intermediate Pre-procedure details:    Preparation:  Patient was prepped and draped in usual sterile fashion and imaging obtained to evaluate for foreign bodies Exploration:    Hemostasis achieved with:  Epinephrine and direct pressure   Wound exploration: wound explored through full range of motion   Treatment:    Area cleansed with:  Shur-Clens   Amount of cleaning:  Standard   Irrigation solution:  Sterile saline   Irrigation method:  Syringe   Visualized foreign bodies/material removed: no   Skin repair:    Repair method:  Staples   Number of staples:  6 Approximation:    Approximation:  Close Post-procedure details:    Dressing:  Antibiotic ointment and non-adherent dressing   Patient tolerance of procedure:  Tolerated well, no immediate complications   (including critical care time)  Medications Ordered in ED Medications  hydrogen peroxide 3 % external solution (not administered)  lidocaine-EPINEPHrine (XYLOCAINE-EPINEPHrine) 1 %-1:200000 (PF) injection 20 mL (not administered)     Initial Impression / Assessment and Plan / ED Course  I have reviewed the triage vital signs and the nursing notes.  Pertinent labs & imaging results that were available during my care of the  patient were reviewed by me and considered in my medical decision making (see chart for details).    Patient brought in by police with alcohol intoxication, head injury and apparently expressing suicidal thoughts.   Patient is alert and oriented 3. He is calm and cooperative. He denies any suicidal or homicidal thoughts now. He is intoxicated but answering questions appropriately.   Screening labs are reassuring and show alcohol intoxication. Head laceration repaired as above. Tetanus is up-to-date.  CT head is negative.  Patient is medically clear for psychiatric evaluation. Holding orders placed.   Final Clinical Impressions(s) / ED Diagnoses   Final diagnoses:  Laceration of scalp, initial encounter  Injury of head, initial encounter  Alcoholic intoxication without complication [facility])    New Prescriptions New Prescriptions   No medications on file     Ezequiel Essex, MD 04/02/17 [phone]

## 2017-04-02 NOTE — ED Notes (Signed)
Security removed pt's cash money from pt's wallet per pt request and locked up.

## 2017-04-02 NOTE — Progress Notes (Signed)
Janett Billow at [facility], maintenance that patient's IVC paperwork has been received and they are waiting to hear back from their doctor. Janett Billow advised that they will Microbiologist when they hear back about decision.  Verlon Setting, MSW, LCSWA Clinical social worker in [place], [place] Office

## 2017-04-02 NOTE — ED Notes (Signed)
Security at bedside to wand pt. 

## 2017-04-03 DIAGNOSIS — F1010 Alcohol abuse, uncomplicated: Secondary | ICD-10-CM | POA: Diagnosis not present

## 2017-04-03 DIAGNOSIS — F1910 Other psychoactive substance abuse, uncomplicated: Secondary | ICD-10-CM | POA: Diagnosis not present

## 2017-04-03 DIAGNOSIS — Z630 Problems in relationship with spouse or partner: Secondary | ICD-10-CM | POA: Diagnosis not present

## 2017-04-03 NOTE — ED Notes (Signed)
Pt walked to the bathroom. 

## 2017-04-03 NOTE — Progress Notes (Signed)
Per Elmarie Shiley, NP, the patient does not meet criteria for inpatient treatment. The patient is recommended for discharge and to follow up with an outpatient substance abuse program. Patient requesting resources for outpatient SA treatment.   CSW provided resources and faxed them to [facility] ED at [phone].   Patient gave verbal permission to speak with his wife. CSW contacted the patient's wife, Florine Romagnoli [phone]) to ensure that the patient will not have access to any weapons at discharge.   Per wife, they are in a gun safe an she will ensure that the patient does not have access to the weapons when he comes home. She also stated that she really hopes the patient takes advantage of the resources provided, because she cannot continue to deal with "his drinking and mess".  There were no further questions or concerns at this time.   Wallene Huh, RN notified.   Radonna Ricker MSW, [place] Disposition [phone]

## 2017-04-03 NOTE — ED Notes (Signed)
Attempted faxing CT results x 5. Fax number is busy all time.

## 2017-04-03 NOTE — ED Provider Notes (Signed)
1:45 PM Assumed care from Dr. Tomi Bamberger, please see their note for full history, physical and decision making until this point. In brief this is a 66 y.o. year old male who presented to the ED tonight with Fall and V70.1     From notes and IVC paperwork recently the patient was belligerent after being intoxicated in his head and also stating suicidal ideation. This is approximately 30 hours ago. Since that time patient sobered up and has been medically cleared. He is denying suicidality to multiple people on multiple occasions and seems remorseful for the events. He has hopeful future plans to get help for the future. He has no suicidal or homicidal ideation at this time.  TTS consulted and agrees with his safety for discharge as evidenced by their note in chart. His IVC paperwork is reversed and he is discharged to follow-up with outpatient resources. He knows to return here for any new or worsening symptoms and also to return to get staples taken out in approximately 9-13 days.  Discharge instructions, including strict return precautions for new or worsening symptoms, given. Patient and/or family verbalized understanding and agreement with the plan as described.   Labs, studies and imaging reviewed by myself and considered in medical decision making if ordered. Imaging interpreted by radiology.  Labs Reviewed  CBC WITH DIFFERENTIAL/PLATELET - Abnormal; Notable for the following:       Result Value   Hemoglobin 18.6 (*)    HCT 52.2 (*)    MCV 111.3 (*)    MCH 39.7 (*)    All other components within normal limits  COMPREHENSIVE METABOLIC PANEL - Abnormal; Notable for the following:    Potassium 3.2 (*)    Glucose, Bld 156 (*)    BUN 5 (*)    Calcium 8.5 (*)    AST 55 (*)    All other components within normal limits  ETHANOL - Abnormal; Notable for the following:    Alcohol, Ethyl (B) 169 (*)    All other components within normal limits  ACETAMINOPHEN LEVEL - Abnormal; Notable for the  following:    Acetaminophen (Tylenol), Serum <10 (*)    All other components within normal limits  RAPID URINE DRUG SCREEN, HOSP PERFORMED  SALICYLATE LEVEL    CT Head Wo Contrast  Final Result      No Follow-up on file.    Merrily Pew, MD 04/03/17 1348

## 2017-04-03 NOTE — ED Notes (Signed)
Pt walked to the bathroom with no problem.  

## 2017-04-03 NOTE — Consult Note (Signed)
Telepsych Consultation   Reason for Consult:  Alcohol intoxication with injury Referring Physician:  EDP Location of Patient: [facility] Location of Provider: [place] [facility]  Patient Identification: Miguel Solis MRN:  [id number] Principal Diagnosis: Alcohol abuse Diagnosis:   Patient Active Problem List   Diagnosis Date Noted  . Alcohol abuse [F10.10] 04/03/2017  . Prostate hypertrophy [N40.0] 01/10/2013  . Elevated PSA [R97.20] 12/05/2012  . Hyperlipemia [E78.5] 11/08/2012  . Essential hypertension, benign [I10] 11/08/2012  . Hyperglycemia [R73.9] 11/08/2012    Total Time spent with patient: 20 minutes  Subjective:   Miguel Solis is a 66 y.o. male patient admitted after hitting his head while intoxicated.   HPI:    Per initial [place] Assessment by Beecher Mcardle Counselor: on 04/02/2017:  Miguel Solis is an 66 y.o. male who was brought to the hospital IVC by his wife after drinking two bottles of wine and falling and hitting his head requiring staples.  Patient states that he recently retired after working for 38 years.  Patient states that he has been drinking on average 2-3 beers 2-3 times weekly.  Patient states: "I just drank too much last night."  He denies having any any suicidal thoughts as reported and states that he has never had any past attempts to hurt himself.  Patient states that he has no previous treatment history.  He states that he has no history of HI or psychosis.  Patient states that he is agreeable to seeking outpatient treatment and AA Meetings, but does not feel like he needs to be hospitalized and states that he can contract for safety.   Per psychiatric assessment on 04/03/2017:  Patient calm and cooperative during the assessment. He was future oriented stating "I just need help to cut back on alcohol. I want to save my marriage. I just need help. I have never had psychiatric issues. I have never tried to hurt myself. I will follow up with  any help that I am given. I think I just got carried away on Saturday." Porter was alert and oriented. Patient unable to provide reason for his increased alcohol consumption prior to his admission stating "I normally drink one beer. Some days I don't want any alcohol at all." He denies any current withdrawal symptoms.   Past Psychiatric History: Denies   Risk to Self: Suicidal Ideation: Denies Suicidal Intent: Denies Is patient at risk for suicide?:  (only when drinking) Suicidal Plan?: No (no plan, but has guns in the home that were verified with wife to be locked in a safe) Access to Means: Yes Specify Access to Suicidal Means:  (has guns in the home) What has been your use of drugs/alcohol within the last 12 months?:  (wife states that he is drinking almost daily) How many times?:  (none) Other Self Harm Risks:  (conflict with brother) Triggers for Past Attempts: Other (Comment) (no prior attempts) Intentional Self Injurious Behavior: None Risk to Others: Homicidal Ideation: No Thoughts of Harm to Others: No Current Homicidal Intent: No Current Homicidal Plan: No Access to Homicidal Means: No Identified Victim:  (none) History of harm to others?: No Assessment of Violence: None Noted Violent Behavior Description:  (none) Does patient have access to weapons?: Yes (Comment) (has guns) Criminal Charges Pending?: No Does patient have a court date: No Prior Inpatient Therapy: Prior Inpatient Therapy: No Prior Therapy Dates:  (none) Prior Therapy Facilty/Provider(s):  (none) Reason for Treatment:  (no prior treatment) Prior Outpatient Therapy:  Prior Outpatient Therapy: No Prior Therapy Dates:  (none) Prior Therapy Facilty/Provider(s):  (none) Reason for Treatment:  (N/A) Does patient have an ACCT team?: No Does patient have Intensive In-House Services?  : No Does patient have [facility] services? : No Does patient have P4CC services?: No  Past Medical History:  Past Medical History:   Diagnosis Date  . Colon polyp 2008  . Gout   . Hyperlipidemia   . Hypertension   . Impaired fasting glucose     Past Surgical History:  Procedure Laterality Date  . KNEE SURGERY    . KNEE SURGERY Right    Family History:  Family History  Problem Relation Age of Onset  . Cancer Mother    Family Psychiatric  History: Denies Social History:  History  Alcohol Use  . Yes     History  Drug Use No    Social History   Social History  . Marital status: Married    Spouse name: N/A  . Number of children: N/A  . Years of education: N/A   Social History Main Topics  . Smoking status: Never Smoker  . Smokeless tobacco: Never Used  . Alcohol use Yes  . Drug use: No  . Sexual activity: Not Asked   Other Topics Concern  . None   Social History Narrative  . None   Additional Social History:    Allergies:  No Known Allergies  Labs:  Results for orders placed or performed during the hospital encounter of 04/02/17 (from the past 48 hour(s))  Rapid urine drug screen (hospital performed)     Status: None   Collection Time: 04/02/17  5:37 AM  Result Value Ref Range   Opiates NONE DETECTED NONE DETECTED   Cocaine NONE DETECTED NONE DETECTED   Benzodiazepines NONE DETECTED NONE DETECTED   Amphetamines NONE DETECTED NONE DETECTED   Tetrahydrocannabinol NONE DETECTED NONE DETECTED   Barbiturates NONE DETECTED NONE DETECTED    Comment:        DRUG SCREEN FOR MEDICAL PURPOSES ONLY.  IF CONFIRMATION IS NEEDED FOR ANY PURPOSE, NOTIFY LAB WITHIN 5 DAYS.        LOWEST DETECTABLE LIMITS FOR URINE DRUG SCREEN Drug Class       Cutoff (ng/mL) Amphetamine      1000 Barbiturate      200 Benzodiazepine   428 Tricyclics       768 Opiates          300 Cocaine          300 THC              50   CBC with Differential/Platelet     Status: Abnormal   Collection Time: 04/02/17  5:44 AM  Result Value Ref Range   WBC 6.7 4.0 - 10.5 K/uL   RBC 4.69 4.22 - 5.81 MIL/uL   Hemoglobin  18.6 (H) 13.0 - 17.0 g/dL   HCT 52.2 (H) 39.0 - 52.0 %   MCV 111.3 (H) 78.0 - 100.0 fL   MCH 39.7 (H) 26.0 - 34.0 pg   MCHC 35.6 30.0 - 36.0 g/dL   RDW 14.1 11.5 - 15.5 %   Platelets 214 150 - 400 K/uL   Neutrophils Relative % 48 %   Neutro Abs 3.2 1.7 - 7.7 K/uL   Lymphocytes Relative 39 %   Lymphs Abs 2.6 0.7 - 4.0 K/uL   Monocytes Relative 8 %   Monocytes Absolute 0.5 0.1 - 1.0 K/uL   Eosinophils Relative  5 %   Eosinophils Absolute 0.4 0.0 - 0.7 K/uL   Basophils Relative 1 %   Basophils Absolute 0.1 0.0 - 0.1 K/uL  Comprehensive metabolic panel     Status: Abnormal   Collection Time: 04/02/17  5:44 AM  Result Value Ref Range   Sodium 139 135 - 145 mmol/L   Potassium 3.2 (L) 3.5 - 5.1 mmol/L   Chloride 102 101 - 111 mmol/L   CO2 26 22 - 32 mmol/L   Glucose, Bld 156 (H) 65 - 99 mg/dL   BUN 5 (L) 6 - 20 mg/dL   Creatinine, Ser 0.74 0.61 - 1.24 mg/dL   Calcium 8.5 (L) 8.9 - 10.3 mg/dL   Total Protein 8.1 6.5 - 8.1 g/dL   Albumin 3.5 3.5 - 5.0 g/dL   AST 55 (H) 15 - 41 U/L   ALT 20 17 - 63 U/L   Alkaline Phosphatase 104 38 - 126 U/L   Total Bilirubin 1.0 0.3 - 1.2 mg/dL   GFR calc non Af Amer >60 >60 mL/min   GFR calc Af Amer >60 >60 mL/min    Comment: (NOTE) The eGFR has been calculated using the CKD EPI equation. This calculation has not been validated in all clinical situations. eGFR's persistently <60 mL/min signify possible Chronic Kidney Disease.    Anion gap 11 5 - 15  Ethanol     Status: Abnormal   Collection Time: 04/02/17  5:44 AM  Result Value Ref Range   Alcohol, Ethyl (B) 169 (H) <10 mg/dL    Comment:        LOWEST DETECTABLE LIMIT FOR SERUM ALCOHOL IS 10 mg/dL FOR MEDICAL PURPOSES ONLY Please note change in reference range.   Acetaminophen level     Status: Abnormal   Collection Time: 04/02/17  5:44 AM  Result Value Ref Range   Acetaminophen (Tylenol), Serum <10 (L) 10 - 30 ug/mL    Comment:        THERAPEUTIC CONCENTRATIONS VARY SIGNIFICANTLY. A  RANGE OF 10-30 ug/mL MAY BE AN EFFECTIVE CONCENTRATION FOR MANY PATIENTS. HOWEVER, SOME ARE BEST TREATED AT CONCENTRATIONS OUTSIDE THIS RANGE. ACETAMINOPHEN CONCENTRATIONS >150 ug/mL AT 4 HOURS AFTER INGESTION AND >50 ug/mL AT 12 HOURS AFTER INGESTION ARE OFTEN ASSOCIATED WITH TOXIC REACTIONS.   Salicylate level     Status: None   Collection Time: 04/02/17  5:44 AM  Result Value Ref Range   Salicylate Lvl <6.0 2.8 - 30.0 mg/dL    Medications:  Current Facility-Administered Medications  Medication Dose Route Frequency Provider Last Rate Last Dose  . atorvastatin (LIPITOR) tablet 20 mg  20 mg Oral Daily Rancour, Stephen, MD   20 mg at 04/03/17 1009  . lisinopril (PRINIVIL,ZESTRIL) tablet 10 mg  10 mg Oral Daily Rancour, Stephen, MD   10 mg at 04/03/17 0954  . LORazepam (ATIVAN) injection 0-4 mg  0-4 mg Intravenous Q6H Rancour, Annie Main, MD       Or  . LORazepam (ATIVAN) tablet 0-4 mg  0-4 mg Oral Q6H Rancour, Stephen, MD      . Derrill Memo ON 04/04/2017] LORazepam (ATIVAN) injection 0-4 mg  0-4 mg Intravenous Q12H Rancour, Stephen, MD       Or  . Derrill Memo ON 04/04/2017] LORazepam (ATIVAN) tablet 0-4 mg  0-4 mg Oral Q12H Rancour, Stephen, MD      . methylPREDNISolone acetate (DEPO-MEDROL) injection 40 mg  40 mg Intra-articular Once [name], MD      . tamsulosin [facility]) capsule 0.4 mg  0.4 mg Oral Daily Rancour, Stephen, MD   0.4 mg at 04/03/17 0954  . thiamine (VITAMIN B-1) tablet 100 mg  100 mg Oral Daily Rancour, Stephen, MD   100 mg at 04/03/17 2585   Or  . thiamine (B-1) injection 100 mg  100 mg Intravenous Daily Rancour, Stephen, MD       Current Outpatient Prescriptions  Medication Sig Dispense Refill  . atorvastatin (LIPITOR) 20 MG tablet Take 1 tablet (20 mg total) by mouth daily. 90 tablet 1  . etodolac (LODINE) 400 MG tablet Take 1 tablet (400 mg total) by mouth 2 (two) times daily. Take with food 42 tablet 1  . lisinopril (PRINIVIL,ZESTRIL) 10 MG tablet Take 1 tablet  (10 mg total) by mouth daily. 90 tablet 1  . naproxen (NAPROSYN) 500 MG tablet Take 1 tablet (500 mg total) by mouth 2 (two) times daily with a meal. Prn pain 30 tablet 0  . sildenafil (REVATIO) 20 MG tablet 2 or 3 tablets 2 hours prior to sex 50 tablet 5  . sildenafil (VIAGRA) 100 MG tablet Take 0.5-1 tablets (50-100 mg total) by mouth daily as needed for erectile dysfunction. 5 tablet 3  . tamsulosin (FLOMAX) 0.4 MG CAPS capsule TAKE 1 CAPSULE (0.4 MG TOTAL) BY MOUTH DAILY. 90 capsule 0    Musculoskeletal:  Unable to assess via camera   Psychiatric Specialty Exam: Physical Exam  Review of Systems  Constitutional: Negative for chills, fever and weight loss.  HENT: Negative for ear pain, hearing loss and tinnitus.   Eyes: Negative for blurred vision and double vision.  Gastrointestinal: Negative for abdominal pain, heartburn, nausea and vomiting.  Neurological: Negative for dizziness, tingling and headaches.  Psychiatric/Behavioral: Positive for substance abuse (Probable abuse of alcohol). Negative for depression, hallucinations, memory loss and suicidal ideas. The patient is not nervous/anxious and does not have insomnia.     Blood pressure (!) 152/91, pulse 62, temperature 98.6 F (37 C), temperature source Oral, resp. rate 20, height _0  (1.803 m), weight 83.9 kg (185 lb), SpO2 98 %.Body mass index is 25.8 kg/m.  General Appearance: Casual  Eye Contact:  Good  Speech:  Clear and Coherent  Volume:  Normal  Mood:  Euthymic  Affect:  Appropriate  Thought Process:  Coherent and Goal Directed  Orientation:  Full (Time, Place, and Person)  Thought Content:  Obtain help to decrease his alcohol use  Suicidal Thoughts:  No  Homicidal Thoughts:  No  Memory:  Immediate;   Good Recent;   Good Remote;   Good  Judgement:  Fair  Insight:  Present  Psychomotor Activity:  Normal  Concentration:  Concentration: Good and Attention Span: Good  Recall:  Good  Fund of Knowledge:  Good   Language:  Good  Akathisia:  No  Handed:  Right  AIMS (if indicated):     Assets:  Communication Skills Desire for Improvement Financial Resources/Insurance Housing Intimacy Leisure Time Physical Health Resilience Social Support Talents/Skills  ADL's:  Intact  Cognition:  WNL  Sleep:        Treatment Plan Summary: Plan Discharge home with outpatient substance abuse resources  Disposition: No evidence of imminent risk to self or others at present.   Patient does not meet criteria for psychiatric inpatient admission. Supportive therapy provided about ongoing stressors. Discussed crisis plan, support from social network, calling 911, coming to the Emergency Department, and calling Suicide Hotline.  This service was provided via telemedicine using a 2-way, interactive audio and video  technology.  Names of all persons participating in this telemedicine service and their role in this encounter. Name: Elmarie Shiley  Role: PMHNP-C  Name: Mallie Mussel Guimond  Role: Patient  Name:  Role:   Name:  Role:     Elmarie Shiley, NP 04/03/2017 10:37 AM

## 2017-04-03 NOTE — ED Notes (Signed)
CT result sent to [facility] for potential placement.

## 2017-04-03 NOTE — ED Notes (Signed)
CT results faxed to potential placement.

## 2017-04-12 ENCOUNTER — Encounter: Payer: Self-pay | Admitting: Family Medicine

## 2017-04-12 ENCOUNTER — Ambulatory Visit (INDEPENDENT_AMBULATORY_CARE_PROVIDER_SITE_OTHER): Payer: Medicare Other | Admitting: Family Medicine

## 2017-04-12 VITALS — BP 120/86 | Ht 70.0 in | Wt 180.1 lb

## 2017-04-12 DIAGNOSIS — F1010 Alcohol abuse, uncomplicated: Secondary | ICD-10-CM

## 2017-04-12 DIAGNOSIS — I10 Essential (primary) hypertension: Secondary | ICD-10-CM

## 2017-04-12 NOTE — Progress Notes (Signed)
   Subjective:    Patient ID: Miguel Solis, male    DOB: [birth year], 66 y.o.   MRN: [id number]  HPI Patient in today for staple removal from scalp  Also notes he is attending alcoholics anonymous meetings. Getting a lot of benefit from them. Has not drank for over a week. Feeling better about things. No suicidal  Compliant with blood pressure medicine. Watching salt intake. Slight sensitivity at site of scalp laceration but no headaches.  States no other concerns this visit.  Review of Systems No headache, no major weight loss or weight gain, no chest pain no back pain abdominal pain no change in bowel habits complete ROS otherwise negative     Objective:   Physical Exam Alert vitals stable, NAD. Blood pressure good on repeat. HEENT normal. Lungs clear. Heart regular rate and rhythm. Staples removed       Assessment & Plan:  Impression 1 hypertension good control discussed maintain same meds #2 status post scalp laceration wound care discused #3 alcohol abuse discussed encouraged to maintain AA meetings and compliance

## 2017-04-21 ENCOUNTER — Ambulatory Visit (INDEPENDENT_AMBULATORY_CARE_PROVIDER_SITE_OTHER): Payer: Medicare Other | Admitting: Family Medicine

## 2017-04-21 DIAGNOSIS — Z4802 Encounter for removal of sutures: Secondary | ICD-10-CM

## 2017-04-23 NOTE — Progress Notes (Signed)
Patient returns briefly for a staple removal. See prior note. He had a large scab over laceration site and was advised return if is stable was under this scab. He came off and he had one scab. Patient was prepped. Staple removed. Wound looks fine. Wound care management discussed. No charge for brief visit

## 2017-05-02 ENCOUNTER — Telehealth: Payer: Self-pay | Admitting: Family Medicine

## 2017-05-02 NOTE — Telephone Encounter (Signed)
Patients daughter, Nira Conn, called concerning her fathers drinking.  She said him and his family are interested on getting him back on medication that will help him with his drinking like Dr. Nicki Reaper has provided in the past.  She has provided Sylvio's home number to call and speak with him if needed.

## 2017-05-05 ENCOUNTER — Encounter (HOSPITAL_COMMUNITY): Payer: Self-pay

## 2017-05-05 ENCOUNTER — Emergency Department (HOSPITAL_COMMUNITY)
Admission: EM | Admit: 2017-05-05 | Discharge: 2017-05-05 | Disposition: A | Discharge status: Home / Self Care | Payer: No Typology Code available for payment source | Attending: Emergency Medicine | Admitting: Emergency Medicine

## 2017-05-05 DIAGNOSIS — Z79899 Other long term (current) drug therapy: Secondary | ICD-10-CM | POA: Insufficient documentation

## 2017-05-05 DIAGNOSIS — I10 Essential (primary) hypertension: Secondary | ICD-10-CM | POA: Insufficient documentation

## 2017-05-05 DIAGNOSIS — F1020 Alcohol dependence, uncomplicated: Secondary | ICD-10-CM | POA: Diagnosis not present

## 2017-05-05 DIAGNOSIS — F1010 Alcohol abuse, uncomplicated: Secondary | ICD-10-CM

## 2017-05-05 DIAGNOSIS — Z046 Encounter for general psychiatric examination, requested by authority: Secondary | ICD-10-CM | POA: Diagnosis present

## 2017-05-05 LAB — CBC WITH DIFFERENTIAL/PLATELET
BASOS ABS: 0.1 10*3/uL (ref 0.0–0.1)
Basophils Relative: 1 %
EOS ABS: 0.3 10*3/uL (ref 0.0–0.7)
EOS PCT: 3 %
HEMATOCRIT: 53.9 % — AB (ref 39.0–52.0)
Hemoglobin: 19.1 g/dL — ABNORMAL HIGH (ref 13.0–17.0)
LYMPHS PCT: 26 %
Lymphs Abs: 2.8 10*3/uL (ref 0.7–4.0)
MCH: 39.6 pg — ABNORMAL HIGH (ref 26.0–34.0)
MCHC: 35.4 g/dL (ref 30.0–36.0)
MCV: 111.8 fL — AB (ref 78.0–100.0)
MONO ABS: 0.6 10*3/uL (ref 0.1–1.0)
Monocytes Relative: 5 %
Neutro Abs: 6.9 10*3/uL (ref 1.7–7.7)
Neutrophils Relative %: 65 %
PLATELETS: 252 10*3/uL (ref 150–400)
RBC: 4.82 MIL/uL (ref 4.22–5.81)
RDW: 13.7 % (ref 11.5–15.5)
WBC: 10.6 10*3/uL — AB (ref 4.0–10.5)

## 2017-05-05 LAB — COMPREHENSIVE METABOLIC PANEL
ALBUMIN: 3.7 g/dL (ref 3.5–5.0)
ALT: 40 U/L (ref 17–63)
ANION GAP: 17 — AB (ref 5–15)
AST: 102 U/L — AB (ref 15–41)
Alkaline Phosphatase: 110 U/L (ref 38–126)
BUN: 6 mg/dL (ref 6–20)
CHLORIDE: 102 mmol/L (ref 101–111)
CO2: 19 mmol/L — AB (ref 22–32)
Calcium: 9.1 mg/dL (ref 8.9–10.3)
Creatinine, Ser: 0.8 mg/dL (ref 0.61–1.24)
GFR calc Af Amer: >60 mL/min (ref >60)
GFR calc non Af Amer: >60 mL/min (ref >60)
GLUCOSE: 153 mg/dL — AB (ref 65–99)
POTASSIUM: 3.8 mmol/L (ref 3.5–5.1)
SODIUM: 138 mmol/L (ref 135–145)
TOTAL PROTEIN: 8.2 g/dL — AB (ref 6.5–8.1)
Total Bilirubin: 1.2 mg/dL (ref 0.3–1.2)

## 2017-05-05 LAB — RAPID URINE DRUG SCREEN, HOSP PERFORMED
AMPHETAMINES: NOT DETECTED
BARBITURATES: NOT DETECTED
BENZODIAZEPINES: NOT DETECTED
COCAINE: NOT DETECTED
Opiates: NOT DETECTED
TETRAHYDROCANNABINOL: NOT DETECTED

## 2017-05-05 LAB — ETHANOL: Alcohol, Ethyl (B): 107 mg/dL — ABNORMAL HIGH (ref <10)

## 2017-05-05 MED ORDER — BENZTROPINE MESYLATE 1 MG PO TABS: 1.0000 mg | ORAL_TABLET | Freq: Four times a day (QID) | ORAL | Status: DC | PRN

## 2017-05-05 MED ORDER — HALOPERIDOL 5 MG PO TABS: 5.0000 mg | ORAL_TABLET | Freq: Four times a day (QID) | ORAL | Status: DC | PRN

## 2017-05-05 MED ORDER — ACETAMINOPHEN 325 MG PO TABS: 650.0000 mg | ORAL_TABLET | ORAL | Status: DC | PRN

## 2017-05-05 NOTE — Telephone Encounter (Signed)
Miguel Solis, can you please let his wife know about inpatient detox in the area. Thanks.

## 2017-05-05 NOTE — ED Notes (Signed)
Officer need no longer needed pt compliant showing no signs of hostility. Conversing pleasantly with staff.

## 2017-05-05 NOTE — Consult Note (Signed)
  Miguel Solis, 66 y.o., male patient presented to [place] [facility] via police under IVC by his wife with complaints of driving under influence of alcohol and not taking his medication correctly. Patientseen via telepsych by this provider on 05/05/17.  Chart reviewed and consulted with Dr. Dwyane Dee.  On evaluation Miguel Solis reports that he was brought to the hospital because he wife feels that he is drinking to much.  Patient states that he drinks about 4 times a week anywhere from 2-3 bottles of wine.  States that he has also driven under the influence in the past but not with this recent intoxication.  States that he is also attending AA 3 times a week but is also interested in other outpatient services that could help him.  States that he doesn't feel that he need inpatient rehab.  Patient denies suicidal/homicidal ideation, psychosis, and paranoia.   During assessment patient sitting up in bed.  Patient is alert/oriented x 4, calm/cooperative, pleasant affect.  Patient does not appear to be responding to internal/external stimuli; and patient denied suicidal/homicidal ideation, psychosis, and paranoia.  Patient interested in outpatient services for alcohol abuse.  Recommendation:  Referral to [facility] IOP.  Continue with AA meeting  Disposition:  Psychiatrically cleared. No evidence of imminent risk to self or others at present.   Recommend psychiatric Inpatient admission when medically cleared. Patient does not meet criteria for psychiatric inpatient admission. Supportive therapy provided about ongoing stressors. Discussed crisis plan, support from social network, calling 911, coming to the Emergency Department, and calling Suicide Hotline.

## 2017-05-05 NOTE — ED Notes (Signed)
Pt is cleared by psychiatry per Jolan at [facility]. Please follow up at [facility] outpatient. [place] to fax resources.

## 2017-05-05 NOTE — ED Provider Notes (Signed)
[facility] EMERGENCY DEPARTMENT Provider Note   CSN: [id number] Arrival date & time: 05/05/17  0110     History   Chief Complaint Chief Complaint  Patient presents with  . Medical Clearance    HPI Miguel Solis is a 66 y.o. male.  Patient presents to the ER for involuntary commitment.  He is brought to the ER by [facility].  Involuntary commitment was taken out by his wife.  She reports that the patient has been drinking heavily and has endangered his life by driving while under the influence, not taking his medications.  Arrival to the ER, patient admits to drinking but does not think that he has a drinking problem.  He denies any homicidality or suicidality.  Patient denies history of withdrawal symptoms.      Past Medical History:  Diagnosis Date  . Colon polyp 2008  . Gout   . Hyperlipidemia   . Hypertension   . Impaired fasting glucose     Patient Active Problem List   Diagnosis Date Noted  . Alcohol abuse 04/03/2017  . Prostate hypertrophy 01/10/2013  . Elevated PSA 12/05/2012  . Hyperlipemia 11/08/2012  . Essential hypertension, benign 11/08/2012  . Hyperglycemia 11/08/2012    Past Surgical History:  Procedure Laterality Date  . KNEE SURGERY    . KNEE SURGERY Right        Home Medications    Prior to Admission medications   Medication Sig Start Date End Date Taking? Authorizing Provider  atorvastatin (LIPITOR) 20 MG tablet Take 1 tablet (20 mg total) by mouth daily. 11/17/16  Yes [name], MD  etodolac (LODINE) 400 MG tablet Take 1 tablet (400 mg total) by mouth 2 (two) times daily. Take with food 03/02/17  Yes [name], MD  lisinopril (PRINIVIL,ZESTRIL) 10 MG tablet Take 1 tablet (10 mg total) by mouth daily. 11/17/16 07/18/20 Yes [name], MD  naproxen (NAPROSYN) 500 MG tablet Take 1 tablet (500 mg total) by mouth 2 (two) times daily with a meal. Prn pain 10/05/16  Yes Luking, Scott A, MD  tamsulosin  (FLOMAX) 0.4 MG CAPS capsule TAKE 1 CAPSULE (0.4 MG TOTAL) BY MOUTH DAILY. 01/02/17  Yes Kathyrn Drown, MD  sildenafil (REVATIO) 20 MG tablet 2 or 3 tablets 2 hours prior to sex 10/11/16   [name], MD  sildenafil (VIAGRA) 100 MG tablet Take 0.5-1 tablets (50-100 mg total) by mouth daily as needed for erectile dysfunction. 10/05/16   Kathyrn Drown, MD    Family History Family History  Problem Relation Age of Onset  . Cancer Mother     Social History Social History  Substance Use Topics  . Smoking status: Never Smoker  . Smokeless tobacco: Never Used  . Alcohol use Yes     Allergies   Patient has no known allergies.   Review of Systems Review of Systems  Psychiatric/Behavioral: Negative for suicidal ideas.  All other systems reviewed and are negative.    Physical Exam Updated Vital Signs Ht 5\' 10"  (1.778 m)   Wt 85.3 kg (188 lb)   BMI 26.98 kg/m   Physical Exam  Constitutional: He is oriented to person, place, and time. He appears well-developed and well-nourished. No distress.  HENT:  Head: Normocephalic and atraumatic.  Right Ear: Hearing normal.  Left Ear: Hearing normal.  Nose: Nose normal.  Mouth/Throat: Oropharynx is clear and moist and mucous membranes are normal.  Eyes: Pupils are equal, round, and  reactive to light. Conjunctivae and EOM are normal.  Neck: Normal range of motion. Neck supple.  Cardiovascular: Regular rhythm, S1 normal and S2 normal.  Exam reveals no gallop and no friction rub.   No murmur heard. Pulmonary/Chest: Effort normal and breath sounds normal. No respiratory distress. He exhibits no tenderness.  Abdominal: Soft. Normal appearance and bowel sounds are normal. There is no hepatosplenomegaly. There is no tenderness. There is no rebound, no guarding, no tenderness at McBurney's point and negative Murphy's sign. No hernia.  Musculoskeletal: Normal range of motion.  Neurological: He is alert and oriented to person, place, and  time. He has normal strength. No cranial nerve deficit or sensory deficit. Coordination normal. GCS eye subscore is 4. GCS verbal subscore is 5. GCS motor subscore is 6.  Skin: Skin is warm, dry and intact. No rash noted. No cyanosis.  Psychiatric: He has a normal mood and affect. His speech is normal. Thought content normal. He is withdrawn. He expresses no homicidal and no suicidal ideation.  Nursing note and vitals reviewed.    ED Treatments / Results  Labs (all labs ordered are listed, but only abnormal results are displayed) Labs Reviewed  COMPREHENSIVE METABOLIC PANEL  ETHANOL  RAPID URINE DRUG SCREEN, HOSP PERFORMED  CBC WITH DIFFERENTIAL/PLATELET    EKG  EKG Interpretation None       Radiology No results found.  Procedures Procedures (including critical care time)  Medications Ordered in ED Medications - No data to display   Initial Impression / Assessment and Plan / ED Course  I have reviewed the triage vital signs and the nursing notes.  Pertinent labs & imaging results that were available during my care of the patient were reviewed by me and considered in my medical decision making (see chart for details).     Patient presents under involuntary commitment.  Involuntary commitment paperwork suggests that the patient is a danger to himself because of his drinking and driving while under the influence of alcohol.  He does not have any active suicidal ideation or homicidal ideation.  Medical clearance performed, will have psychiatry evaluation.  Final Clinical Impressions(s) / ED Diagnoses   Final diagnoses:  Alcohol abuse    New Prescriptions New Prescriptions   No medications on file     Orpah Greek, MD 05/05/17 [phone]

## 2017-05-05 NOTE — Discharge Instructions (Signed)
Substance Abuse Treatment Programs ° °Intensive Outpatient Programs °[facility]     °[address]      °[place], [place]                   °[phone]      ° °[facility] °[address] °[place], [place] °336-379-7146 ° °[facility]     °(Inpatient and outpatient)     °[address]           °336-832-9800   ° °[facility] °336-288-1484 (Suboxone and Methadone) ° °[address]      °[place], [place] [address]      °336-882-2125      ° °[address] °[place], [place] °852-3033 ° °[facility] (Outpatient/Inpatient, Chemical)    °(insurance only) [phone]      °       °[facility] (Groups & Residential) °[place], [place] °336-389-1413 ° °   °[facility]     °[address]     °[place], [place]      °336-389-1413      ° °[facility] (for caregivers and family) °[address] °[place], [place] °336-299-4655 ° ° ° ° ° °Residential Treatment Programs °[facility]      °[address], [place], [place] [address]  °([phone]) [phone]      ° °[facility] °[address] [place], [place] [address] °919-419-1059 ° °[facility]        °336-248-8914      ° °[facility] °1-800-659-3381 ° °[facility]             °[address]                                         °[place], [place]                                                °877-615-2722 or 336-784-9470                              ° °[facility] [place] °[address] °[place], [address] °1.877.941.8954 ° °[facility]    °[address]      °[place], [place]     °336-273-5306      ° °[facility] °[address] °[place], [place] °336-285-9073 ° °[facility]   °[address]     °[place], [place] [address]     °336-899-1550      °Admissions: 8am-3pm M-F ° °[facility] °[address] °[place] °336-227-7417 ° °[facility]: Residential Program (90 Days)   °[place], [place]      °336-725-8389 or 800-758-6077    ° °[facility] °[place], [place] °(Walk in Hours over the weekend or by referral) ° °[facility] °[address], [place], [place] [address] °(336) [phone] ° °Crisis Mobile: [facility]:  1-877-626-1772 (for crisis response 24 hours a day) °[facility] Hotline:      1-800-256-2452 °Outpatient Psychiatry and Counseling ° °[facility]: Mobile Crisis   Management 24 hours:  1-877-626-1772 ° °[facility] sliding scale fee and walk in schedule: M-F 8am-12pm/1pm-3pm °[address]  °[place], [place] [address] °336-387-6161 ° °[facility] °[address] °[place], [place] [address] °336-703-9650 ° °[facility] ([facility] new patient walk-in appointments available Monday - Friday 8am -3pm.          °[address] °[place], [place] [address] °336-676-6840 or crisis line- 336-676-6905 ° °[facility]/ Intensive Outpatient Therapy Program °[address] °[place], [place] [address] °336-832-9804 ° °[facility]                  °Crisis Services      °336.641.4993      °[address]     °[place], [place] [address]                ° °[facility]   °[facility] °800.525.9375 °[address] °[place], [place] [address] ° ° °[facility]          °[address],  °[place], [place] [address]       °(336) [phone] ° °[facility] °[address] °[place], [place] [address] °336-292-1510 ° °[facility]    °[address]    °[place], [place] [address]     °336-632-3505      ° °Parish McKinney, [place]     °[address]     °[place] [address]     °336-282-1251     °  °[facility] °[address] °[place] [address] ° °[facility]     °[address]     °[place], [place]      °336-542-2076      ° °[facility] °Shamsher Ahluwalia, MD °[address] °[place], [place] [address] °336-420-9558 ° °[facility]     °[address]     °[place], [place] [address]     °336-274-1237      ° °[facility] °[address] °[place], [place] [address] °336-854-4450 °Resources for Temporary Residential Assistance/Crisis Centers ° °DAY CENTERS °[facility] °M-F 8am-3pm   °[address] [place], [place] [address]   336-332-0824 °Services include: laundry, barbering, support groups, case management, phone  & computer access, showers, AA/NA mtgs, mental health/substance abuse nurse, job skills class, disability information, VA assistance, spiritual classes, etc.  ° °HOMELESS SHELTERS ° °[facility]     °[facility]   °[address], [place]     °336.271.5959       °       °[facility] (women and children)       °[address] °[place], [place] [address] °336-275-0820 °[email or web address] for application and process °Application Required ° °[facility]   °[address]    °[place] [address]     °[phone]       °             °[facility] °[address] °[place],  [address] °336.273.5572 °336-235-0363(schedule application appt.) °Application Required ° °[facility] (women only)    °[address]     °[place],  [address]     °336-884-1039      °  Intake starts 6pm daily °Need valid ID, SSC, & Police report °[facility] [place] °[address] °[place], [place] °336-881-5420 °Application Required ° °[facility] (men only)     °[address]      °[place], [place]     °[phone]      ° °[facility] °(Pregnant women only) °[address] °[place], [place] °336-275-0206 ° °[facility]      °[address] [place] [address]      °[place], [place] [address]     °336-722-9951      °       °[facility] °[address] °[place], [place] °336-723-1848 °90 day commitment/SA/Application process ° °[facility](men only)     °[address]     °[place], [place]     °[phone]       °Check-in at 7pm     °       °[facility] °[address] °[place],  [address] °336-248-6684 °Men/Women/Women and Children must be there by 7 pm ° °[facility] °[place],  °336-722-8721                ° °

## 2017-05-05 NOTE — Telephone Encounter (Signed)
Patients wife, Florine, called concerned that Roddy is having trouble with his drinking again.  He fell 3 times and he is currently in [facility].  His wife had to take papers out on him yesterday.  She is wanting to know if Hoyle Sauer or Dr. Richardson Landry can help her with getting him into a detox program.  She is concerned he is going to hurt someone.

## 2017-05-05 NOTE — ED Triage Notes (Signed)
Pt brought in by [place] deputy with IVC papers taken out by his wife, states he is not taking care of himself, not eating properly or taking his medications.  Pt denies any self harm intent, states he drinks daily and has no problems.  Pt denies SI or HI

## 2017-05-05 NOTE — Progress Notes (Signed)
Per Earleen Newport, NP, the patient does not meet criteria for inpatient treatment. The patient is recommended for discharge and to follow up with [facility] in [place].  The patient is psychiatrically cleared.    CSW will fax additional resources for outpatient SA treatment to [phone].   Nolon Stalls, RN notified.    Radonna Ricker MSW, [place] Disposition [phone]
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
## 2017-05-05 NOTE — ED Notes (Signed)
Wife Florine called and states she  Wants her husband to go for in patient placement for alcohol.  Florine number  [phone]

## 2017-05-05 NOTE — ED Notes (Signed)
[employer] called for d/c transportation.

## 2017-05-08 NOTE — Telephone Encounter (Signed)
Patient wife aware of all. Also gave her the name and number of [facility].

## 2017-05-08 NOTE — Telephone Encounter (Signed)
I saw they did not admit him involuntarily to in patient treatment and also I saw that he thinks he does not have a serious problem and does not need to be admitted.  Please let family (wife) know I do not prescribe mewdicine for alc0ohol abuse . That has to be provided by a detox clinic that has expertise in these  Matters.

## 2017-05-23 ENCOUNTER — Ambulatory Visit: Payer: Medicare Other | Admitting: Family Medicine

## 2017-05-23 VITALS — BP 114/72 | Ht 70.0 in | Wt 181.0 lb

## 2017-05-23 DIAGNOSIS — G8929 Other chronic pain: Secondary | ICD-10-CM

## 2017-05-23 DIAGNOSIS — M25561 Pain in right knee: Secondary | ICD-10-CM

## 2017-05-23 MED ORDER — TRIAMCINOLONE ACETONIDE 0.1 % EX CREA: 1.0000 "application " | TOPICAL_CREAM | Freq: Two times a day (BID) | CUTANEOUS | 2 refills | Status: DC

## 2017-05-23 NOTE — Progress Notes (Signed)
   Subjective:    Patient ID: Miguel Solis, male    DOB: 11/28/1950, 66 y.o.   MRN: [id number]  Knee Pain   Incident onset: over one month. There was no injury mechanism. Pain location: right knee  He has tried nothing for the symptoms.    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  Recently he did take a fall in the knee.  Noticed progressive aching and discomfort.  Slight swelling.  No tenderness notes an injection in the past deafly help take care of the pain  Review of Systems No headache, no major weight loss or weight gain, no chest pain no back pain abdominal pain no change in bowel habits complete ROS otherwise negative     Objective:   Physical Exam Alert vitals stable, NAD. Blood pressure good on repeat. HEENT normal. Lungs clear. Heart regular rate and rhythm. Right knee mild effusion positive crepitation with extension 3  Patient draped in anesthetized with 1 cc Depo-Medrol cc Xylocaine       Assessment & Plan:  Impression knee injection measures discussed

## 2017-05-28 MED ORDER — METHYLPREDNISOLONE ACETATE 40 MG/ML IJ SUSP: 40.0000 mg | Freq: Once | INTRAMUSCULAR | Status: DC

## 2017-06-09 ENCOUNTER — Telehealth: Payer: Self-pay | Admitting: Family Medicine

## 2017-06-09 DIAGNOSIS — E785 Hyperlipidemia, unspecified: Secondary | ICD-10-CM

## 2017-06-09 DIAGNOSIS — D7589 Other specified diseases of blood and blood-forming organs: Secondary | ICD-10-CM

## 2017-06-09 DIAGNOSIS — E119 Type 2 diabetes mellitus without complications: Secondary | ICD-10-CM

## 2017-06-09 NOTE — Telephone Encounter (Signed)
Pt is requesting lab orders to be sent over. Last labs per epic were: cbc,ethanol,cmp on 05/05/17

## 2017-06-12 NOTE — Telephone Encounter (Signed)
CBC, metabolic 7, lipid, liver, folic acid, B01, [id number], macrocytosis, diabetes

## 2017-06-13 NOTE — Telephone Encounter (Signed)
I called and left a message. Asked to r/c. I have sent in the  Lab orders.

## 2017-06-14 NOTE — Telephone Encounter (Signed)
Left message letting pt know his orders are put in.

## 2017-06-15 DIAGNOSIS — D7589 Other specified diseases of blood and blood-forming organs: Secondary | ICD-10-CM | POA: Diagnosis not present

## 2017-06-15 DIAGNOSIS — E119 Type 2 diabetes mellitus without complications: Secondary | ICD-10-CM | POA: Diagnosis not present

## 2017-06-15 DIAGNOSIS — E785 Hyperlipidemia, unspecified: Secondary | ICD-10-CM | POA: Diagnosis not present

## 2017-06-16 LAB — HEMOGLOBIN A1C
Est. average glucose Bld gHb Est-mCnc: 143 mg/dL
HEMOGLOBIN A1C: 6.6 % — AB (ref 4.8–5.6)

## 2017-06-16 LAB — CBC WITH DIFFERENTIAL/PLATELET
BASOS ABS: 0.1 10*3/uL (ref 0.0–0.2)
Basos: 1 %
EOS (ABSOLUTE): 0.4 10*3/uL (ref 0.0–0.4)
Eos: 5 %
HEMOGLOBIN: 19.2 g/dL — AB (ref 13.0–17.7)
Hematocrit: 54.7 % — ABNORMAL HIGH (ref 37.5–51.0)
IMMATURE GRANS (ABS): 0 10*3/uL (ref 0.0–0.1)
Immature Granulocytes: 0 %
LYMPHS: 34 %
Lymphocytes Absolute: 2.7 10*3/uL (ref 0.7–3.1)
MCH: 38.7 pg — AB (ref 26.6–33.0)
MCHC: 35.1 g/dL (ref 31.5–35.7)
MCV: 110 fL — ABNORMAL HIGH (ref 79–97)
MONOCYTES: 7 %
Monocytes Absolute: 0.6 10*3/uL (ref 0.1–0.9)
NEUTROS ABS: 4.1 10*3/uL (ref 1.4–7.0)
Neutrophils: 53 %
Platelets: 216 10*3/uL (ref 150–379)
RBC: 4.96 x10E6/uL (ref 4.14–5.80)
RDW: 14.3 % (ref 12.3–15.4)
WBC: 7.9 10*3/uL (ref 3.4–10.8)

## 2017-06-16 LAB — LIPID PANEL
CHOLESTEROL TOTAL: 260 mg/dL — AB (ref 100–199)
Chol/HDL Ratio: 4.9 ratio (ref 0.0–5.0)
HDL: 53 mg/dL (ref >39)
LDL CALC: 180 mg/dL — AB (ref 0–99)
TRIGLYCERIDES: 135 mg/dL (ref 0–149)
VLDL CHOLESTEROL CAL: 27 mg/dL (ref 5–40)

## 2017-06-16 LAB — BASIC METABOLIC PANEL
BUN / CREAT RATIO: 5 — AB (ref 10–24)
BUN: 4 mg/dL — AB (ref 8–27)
CALCIUM: 9.6 mg/dL (ref 8.6–10.2)
CO2: 24 mmol/L (ref 20–29)
CREATININE: 0.84 mg/dL (ref 0.76–1.27)
Chloride: 99 mmol/L (ref 96–106)
GFR calc Af Amer: 105 mL/min/{1.73_m2} (ref >59)
GFR calc non Af Amer: 91 mL/min/{1.73_m2} (ref >59)
GLUCOSE: 150 mg/dL — AB (ref 65–99)
Potassium: 5.6 mmol/L — ABNORMAL HIGH (ref 3.5–5.2)
Sodium: 139 mmol/L (ref 134–144)

## 2017-06-16 LAB — HEPATIC FUNCTION PANEL
ALBUMIN: 4.1 g/dL (ref 3.6–4.8)
ALK PHOS: 120 IU/L — AB (ref 39–117)
ALT: 20 IU/L (ref 0–44)
AST: 41 IU/L — ABNORMAL HIGH (ref 0–40)
BILIRUBIN TOTAL: 1 mg/dL (ref 0.0–1.2)
Bilirubin, Direct: 0.24 mg/dL (ref 0.00–0.40)
TOTAL PROTEIN: 7.7 g/dL (ref 6.0–8.5)

## 2017-06-16 LAB — FOLATE: Folate: 3.8 ng/mL (ref >3.0)

## 2017-06-16 LAB — VITAMIN B12: VITAMIN B 12: 443 pg/mL (ref 232–1245)

## 2017-06-19 ENCOUNTER — Ambulatory Visit (INDEPENDENT_AMBULATORY_CARE_PROVIDER_SITE_OTHER): Payer: Medicare Other | Admitting: Family Medicine

## 2017-06-19 ENCOUNTER — Encounter: Payer: Self-pay | Admitting: Family Medicine

## 2017-06-19 VITALS — BP 148/96 | Ht 67.75 in | Wt 182.0 lb

## 2017-06-19 DIAGNOSIS — Z79899 Other long term (current) drug therapy: Secondary | ICD-10-CM

## 2017-06-19 DIAGNOSIS — R739 Hyperglycemia, unspecified: Secondary | ICD-10-CM | POA: Diagnosis not present

## 2017-06-19 DIAGNOSIS — E785 Hyperlipidemia, unspecified: Secondary | ICD-10-CM | POA: Diagnosis not present

## 2017-06-19 DIAGNOSIS — Z0001 Encounter for general adult medical examination with abnormal findings: Secondary | ICD-10-CM

## 2017-06-19 DIAGNOSIS — I10 Essential (primary) hypertension: Secondary | ICD-10-CM

## 2017-06-19 DIAGNOSIS — Z1211 Encounter for screening for malignant neoplasm of colon: Secondary | ICD-10-CM

## 2017-06-19 DIAGNOSIS — Z0000 Encounter for general adult medical examination without abnormal findings: Secondary | ICD-10-CM

## 2017-06-19 MED ORDER — LISINOPRIL 10 MG PO TABS: 10.0000 mg | ORAL_TABLET | Freq: Every day | ORAL | 1 refills | Status: DC

## 2017-06-19 MED ORDER — ATORVASTATIN CALCIUM 20 MG PO TABS: 20.0000 mg | ORAL_TABLET | Freq: Every day | ORAL | 1 refills | Status: DC

## 2017-06-19 NOTE — Progress Notes (Signed)
Subjective:    Patient ID: Miguel Solis, male    DOB: [birth year], 66 y.o.   MRN: [id number]  HPI AWV- Annual Wellness Visit  The patient was seen for their annual wellness visit. The patient's past medical history, surgical history, and family history were reviewed. Pertinent vaccines were reviewed ( tetanus, pneumonia, shingles, flu) The patient's medication list was reviewed and updated.  The height and weight were entered. The patient's current BMI is: 27.88  Cognitive screening was completed. Outcome of Mini - Cog: pass  Falls within the past 6 months:  Yes. Was drinking too much and feel. Has quit drinking. Has not had a drink in 2 months. Going to [employer].   Current tobacco usage: none (All patients who use tobacco were given written and verbal information on quitting)  Recent listing of emergency department/hospitalizations over the past year were reviewed.  current specialist the patient sees on a regular basis: none   Medicare annual wellness visit patient questionnaire was reviewed.  A written screening schedule for the patient for the next 5-10 years was given. Appropriate discussion of followup regarding next visit was discussed.  Declines flu vaccine.   Due for colonoscopy   Results for orders placed or performed in visit on 06/09/17  CBC with Differential/Platelet  Result Value Ref Range   WBC 7.9 3.4 - 10.8 x10E3/uL   RBC 4.96 4.14 - 5.80 x10E6/uL   Hemoglobin 19.2 (H) 13.0 - 17.7 g/dL   Hematocrit 54.7 (H) 37.5 - 51.0 %   MCV 110 (H) 79 - 97 fL   MCH 38.7 (H) 26.6 - 33.0 pg   MCHC 35.1 31.5 - 35.7 g/dL   RDW 14.3 12.3 - 15.4 %   Platelets 216 150 - 379 x10E3/uL   Neutrophils 53 Not Estab. %   Lymphs 34 Not Estab. %   Monocytes 7 Not Estab. %   Eos 5 Not Estab. %   Basos 1 Not Estab. %   Neutrophils Absolute 4.1 1.4 - 7.0 x10E3/uL   Lymphocytes Absolute 2.7 0.7 - 3.1 x10E3/uL   Monocytes Absolute 0.6 0.1 - 0.9 x10E3/uL   EOS (ABSOLUTE) 0.4  0.0 - 0.4 x10E3/uL   Basophils Absolute 0.1 0.0 - 0.2 x10E3/uL   Immature Granulocytes 0 Not Estab. %   Immature Grans (Abs) 0.0 0.0 - 0.1 B28U1/LK  Basic metabolic panel  Result Value Ref Range   Glucose 150 (H) 65 - 99 mg/dL   BUN 4 (L) 8 - 27 mg/dL   Creatinine, Ser 0.84 0.76 - 1.27 mg/dL   GFR calc non Af Amer 91 >59 mL/min/1.73   GFR calc Af Amer 105 >59 mL/min/1.73   BUN/Creatinine Ratio 5 (L) 10 - 24   Sodium 139 134 - 144 mmol/L   Potassium 5.6 (H) 3.5 - 5.2 mmol/L   Chloride 99 96 - 106 mmol/L   CO2 24 20 - 29 mmol/L   Calcium 9.6 8.6 - 10.2 mg/dL  Lipid panel  Result Value Ref Range   Cholesterol, Total 260 (H) 100 - 199 mg/dL   Triglycerides 135 0 - 149 mg/dL   HDL 53 >39 mg/dL   VLDL Cholesterol Cal 27 5 - 40 mg/dL   LDL Calculated 180 (H) 0 - 99 mg/dL   Chol/HDL Ratio 4.9 0.0 - 5.0 ratio  Hepatic function panel  Result Value Ref Range   Total Protein 7.7 6.0 - 8.5 g/dL   Albumin 4.1 3.6 - 4.8 g/dL   Bilirubin Total 1.0 0.0 -  1.2 mg/dL   Bilirubin, Direct 0.24 0.00 - 0.40 mg/dL   Alkaline Phosphatase 120 (H) 39 - 117 IU/L   AST 41 (H) 0 - 40 IU/L   ALT 20 0 - 44 IU/L  Folate  Result Value Ref Range   Folate 3.8 >3.0 ng/mL  Vitamin B12  Result Value Ref Range   Vitamin B-12 443 232 - 1,245 pg/mL  Hemoglobin A1c  Result Value Ref Range   Hgb A1c MFr Bld 6.6 (H) 4.8 - 5.6 %   Est. average glucose Bld gHb Est-mCnc 143 mg/dL   Patient continues to take lipid medication regularly. No obvious side effects from it. Generally does not miss a dose. Prior blood work results are reviewed with patient. Patient continues to work on fat intake in diet   Review of Systems  Constitutional: Negative for activity change, appetite change and fever.  HENT: Negative for congestion and rhinorrhea.   Eyes: Negative for discharge.  Respiratory: Negative for cough and wheezing.   Cardiovascular: Negative for chest pain.  Gastrointestinal: Negative for abdominal pain, blood in  stool and vomiting.  Genitourinary: Negative for difficulty urinating and frequency.  Musculoskeletal: Negative for neck pain.  Skin: Negative for rash.  Allergic/Immunologic: Negative for environmental allergies and food allergies.  Neurological: Negative for weakness and headaches.  Psychiatric/Behavioral: Negative for agitation.  All other systems reviewed and are negative.      Objective:   Physical Exam  Constitutional: He appears well-developed and well-nourished.  HENT:  Head: Normocephalic and atraumatic.  Right Ear: External ear normal.  Left Ear: External ear normal.  Nose: Nose normal.  Mouth/Throat: Oropharynx is clear and moist.  Eyes: EOM are normal. Pupils are equal, round, and reactive to light.  Neck: Normal range of motion. Neck supple. No thyromegaly present.  Cardiovascular: Normal rate, regular rhythm and normal heart sounds.  No murmur heard. Pulmonary/Chest: Effort normal and breath sounds normal. No respiratory distress. He has no wheezes.  Abdominal: Soft. Bowel sounds are normal. He exhibits no distension and no mass. There is no tenderness.  Genitourinary: Penis normal.  Musculoskeletal: Normal range of motion. He exhibits no edema.  Lymphadenopathy:    He has no cervical adenopathy.  Neurological: He is alert. He exhibits normal muscle tone.  Skin: Skin is warm and dry. No erythema.  Psychiatric: He has a normal mood and affect. His behavior is normal. Judgment normal.  Vitals reviewed.         Assessment & Plan:   Impression wellness exam.  Diet discussed.  Exercise discussed.  Do colonoscopy.  We will help set this up.  2.  Hyperlipidemia.  Discussed.  Patient come off this medication long discussion held patient agrees to resuming.  3.  Chronic alcohol abuse.  Patient struggling with this.  Encouraged to continue participating in [employer].  Patient states he will will work on setting up colonoscopy.  Blood work discussed.  Follow-up as  scheduled.

## 2017-07-12 ENCOUNTER — Ambulatory Visit (INDEPENDENT_AMBULATORY_CARE_PROVIDER_SITE_OTHER): Payer: Self-pay

## 2017-07-12 DIAGNOSIS — Z86010 Personal history of colonic polyps: Secondary | ICD-10-CM

## 2017-07-12 MED ORDER — PEG 3350-KCL-NA BICARB-NACL 420 G PO SOLR: 4000.0000 mL | ORAL | 0 refills | Status: DC

## 2017-07-12 NOTE — Progress Notes (Signed)
Gastroenterology Pre-Procedure Review  Request Date:07/12/17 Requesting Physician: Dr.Luking  PATIENT REVIEW QUESTIONS: The patient responded to the following health history questions as indicated:    1. Diabetes Melitis: no 2. Joint replacements in the past 12 months: no 3. Major health problems in the past 3 months: no 4. Has an artificial valve or MVP: no 5. Has a defibrillator: no 6. Has been advised in past to take antibiotics in advance of a procedure like teeth cleaning: no 7. Family history of colon cancer: no  8. Alcohol Use: no- going to AA for 2-3 months 9. History of sleep apnea: no  10. History of coronary artery or other vascular stents placed within the last 12 months: no 11. History of any prior anesthesia complications: no    MEDICATIONS & ALLERGIES:    Patient reports the following regarding taking any blood thinners:   Plavix? no Aspirin? no Coumadin? no Brilinta? no Xarelto? no Eliquis? no Pradaxa? no Savaysa? no Effient? no  Patient confirms/reports the following medications:  Current Outpatient Medications  Medication Sig Dispense Refill  . atorvastatin (LIPITOR) 20 MG tablet Take 1 tablet (20 mg total) by mouth daily. 90 tablet 1  . etodolac (LODINE) 400 MG tablet Take 1 tablet (400 mg total) by mouth 2 (two) times daily. Take with food 42 tablet 1  . lisinopril (PRINIVIL,ZESTRIL) 10 MG tablet Take 1 tablet (10 mg total) by mouth daily. 90 tablet 1  . naproxen (NAPROSYN) 500 MG tablet Take 1 tablet (500 mg total) by mouth 2 (two) times daily with a meal. Prn pain 30 tablet 0  . sildenafil (REVATIO) 20 MG tablet 2 or 3 tablets 2 hours prior to sex 50 tablet 5  . sildenafil (VIAGRA) 100 MG tablet Take 0.5-1 tablets (50-100 mg total) by mouth daily as needed for erectile dysfunction. 5 tablet 3  . tamsulosin (FLOMAX) 0.4 MG CAPS capsule TAKE 1 CAPSULE (0.4 MG TOTAL) BY MOUTH DAILY. 90 capsule 0  . triamcinolone cream (KENALOG) 0.1 % Apply 1 application  topically 2 (two) times daily. 30 g 2  . atorvastatin (LIPITOR) 20 MG tablet Take 1 tablet (20 mg total) by mouth at bedtime. 90 tablet 1  . polyethylene glycol-electrolytes (TRILYTE) 420 g solution Take 4,000 mLs by mouth as directed. 4000 mL 0   Current Facility-Administered Medications  Medication Dose Route Frequency Provider Last Rate Last Dose  . methylPREDNISolone acetate (DEPO-MEDROL) injection 40 mg  40 mg Intra-articular Once [name], MD      . methylPREDNISolone acetate (DEPO-MEDROL) injection 40 mg  40 mg Intra-articular Once [name], MD        Patient confirms/reports the following allergies:  No Known Allergies  No orders of the defined types were placed in this encounter.   AUTHORIZATION INFORMATION Primary Insurance: BCBS ,  [place] #: [id number] Pre-Cert / Josem Kaufmann required: no   SCHEDULE INFORMATION: Procedure has been scheduled as follows:  Date: 08/18/17, Time: 10:30 Location: [facility]  This Gastroenterology Pre-Precedure Review Form is being routed to the following provider(s): Roseanne Kaufman NP

## 2017-07-12 NOTE — Patient Instructions (Signed)
Miguel Solis   1950/10/02 MRN: [id number]    Procedure Date: 08/18/17 Time to register: 9:30 Place to register: [facility] Procedure Time: 10:30 Scheduled provider: R. Garfield Cornea, MD  PREPARATION FOR COLONOSCOPY WITH TRI-LYTE SPLIT PREP  Please notify us immediately if you are diabetic, take iron supplements, or if you are on Coumadin or any other blood thinners.   Please hold the following medications: none  You will need to purchase 1 fleet enema and 1 box of Bisacodyl 81m tablets.   2 DAYS BEFORE PROCEDURE:  DATE: 08/16/17  DAY: Wednesday Begin clear liquid diet AFTER your lunch meal. NO SOLID FOODS after this point.  1 DAY BEFORE PROCEDURE:  DATE: 08/17/17   DAY: Thursday Continue clear liquids the entire day - NO SOLID FOOD.   Diabetic medications adjustments for today: none  At 2:00 pm:  Take 2 Bisacodyl tablets.   At 4:00pm:  Start drinking your solution. Make sure you mix well per instructions on the bottle. Try to drink 1 (one) 8 ounce glass every 10-15 minutes until you have consumed HALF the jug. You should complete by 6:00pm.You must keep the left over solution refrigerated until completed next day.  Continue clear liquids. You must drink plenty of clear liquids to prevent dehyration and kidney failure. Nothing to eat or drink after midnight.  EXCEPTION: If you take medications for your heart, blood pressure or breathing, you may take these medications with a small amount of clear liquid.    DAY OF PROCEDURE:   DATE: 08/18/17   DAY: Friday  Diabetic medications adjustments for today: none  Five hours before your procedure time @ 5:30am:  Finish remaining amout of bowel prep, drinking 1 (one) 8 ounce glass every 10-15 minutes until complete. You have two hours to consume remaining prep.   Three hours before your procedure time _0 :30am:  Nothing by mouth.   At least one hour before going to the hospital:  Give yourself one Fleet enema. You may take  your morning medications with sip of water unless we have instructed otherwise.      Please see below for Dietary Information.  CLEAR LIQUIDS INCLUDE:  Water Jello (NOT red in color)   Ice Popsicles (NOT red in color)   Tea (sugar ok, no milk/cream) Powdered fruit flavored drinks  Coffee (sugar ok, no milk/cream) Gatorade/ Lemonade/ Kool-Aid  (NOT red in color)   Juice: apple, white grape, white cranberry Soft drinks  Clear bullion, consomme, broth (fat free beef/chicken/vegetable)  Carbonated beverages (any kind)  Strained chicken noodle soup Hard Candy   Remember: Clear liquids are liquids that will allow you to see your fingers on the other side of a clear glass. Be sure liquids are NOT red in color, and not cloudy, but CLEAR.  DO NOT EAT OR DRINK ANY OF THE FOLLOWING:  Dairy products of any kind   Cranberry juice Tomato juice / V8 juice   Grapefruit juice Orange juice     Red grape juice  Do not eat any solid foods, including such foods as: cereal, oatmeal, yogurt, fruits, vegetables, creamed soups, eggs, bread, crackers, pureed foods in a blender, etc.   HELPFUL HINTS FOR DRINKING PREP SOLUTION:   Make sure prep is extremely cold. Mix and refrigerate the the morning of the prep. You may also put in the freezer.   You may try mixing some Crystal Light or Country Time Lemonade if you prefer. Mix in small amounts; add more if necessary.  Try drinking through a straw  Rinse mouth with water or a mouthwash between glasses, to remove after-taste.  Try sipping on a cold beverage /ice/ popsicles between glasses of prep.  Place a piece of sugar-free hard candy in mouth between glasses.  If you become nauseated, try consuming smaller amounts, or stretch out the time between glasses. Stop for 30-60 minutes, then slowly start back drinking.        OTHER INSTRUCTIONS  You will need a responsible adult at least 68 years of age to accompany you and drive you home. This person  must remain in the waiting room during your procedure. The hospital will cancel your procedure if you do not have a responsible adult with you.   1. Wear loose fitting clothing that is easily removed. 2. Leave jewelry and other valuables at home.  3. Remove all body piercing jewelry and leave at home. 4. Total time from sign-in until discharge is approximately 2-3 hours. 5. You should go home directly after your procedure and rest. You can resume normal activities the day after your procedure. 6. The day of your procedure you should not:  Drive  Make legal decisions  Operate machinery  Drink alcohol  Return to work   You may call the office (Dept: [phone]) before 5:00pm, or page the doctor on call [phone]) after 5:00pm, for further instructions, if necessary.   Insurance Information YOU WILL NEED TO CHECK WITH YOUR INSURANCE COMPANY FOR THE BENEFITS OF COVERAGE YOU HAVE FOR THIS PROCEDURE.  UNFORTUNATELY, NOT ALL INSURANCE COMPANIES HAVE BENEFITS TO COVER ALL OR PART OF THESE TYPES OF PROCEDURES.  IT IS YOUR RESPONSIBILITY TO CHECK YOUR BENEFITS, HOWEVER, WE WILL BE GLAD TO ASSIST YOU WITH ANY CODES YOUR INSURANCE COMPANY MAY NEED.    PLEASE NOTE THAT MOST INSURANCE COMPANIES WILL NOT COVER A SCREENING COLONOSCOPY FOR PEOPLE UNDER THE AGE OF 50  IF YOU HAVE BCBS INSURANCE, YOU MAY HAVE BENEFITS FOR A SCREENING COLONOSCOPY BUT IF POLYPS ARE FOUND THE DIAGNOSIS WILL CHANGE AND THEN YOU MAY HAVE A DEDUCTIBLE THAT WILL NEED TO BE MET. SO PLEASE MAKE SURE YOU CHECK YOUR BENEFITS FOR A SCREENING COLONOSCOPY AS WELL AS A DIAGNOSTIC COLONOSCOPY.

## 2017-08-18 ENCOUNTER — Encounter (HOSPITAL_COMMUNITY)
Admission: RE | Disposition: A | Discharge status: Home / Self Care | Payer: Self-pay | Source: Ambulatory Visit | Attending: Internal Medicine

## 2017-08-18 ENCOUNTER — Other Ambulatory Visit: Payer: Self-pay

## 2017-08-18 ENCOUNTER — Ambulatory Visit (HOSPITAL_COMMUNITY)
Admission: RE | Admit: 2017-08-18 | Discharge: 2017-08-18 | Disposition: A | Discharge status: Home / Self Care | Payer: Medicare Other | Source: Ambulatory Visit | Attending: Internal Medicine | Admitting: Internal Medicine

## 2017-08-18 ENCOUNTER — Encounter (HOSPITAL_COMMUNITY): Payer: Self-pay | Admitting: *Deleted

## 2017-08-18 DIAGNOSIS — D120 Benign neoplasm of cecum: Secondary | ICD-10-CM | POA: Insufficient documentation

## 2017-08-18 DIAGNOSIS — Z1211 Encounter for screening for malignant neoplasm of colon: Secondary | ICD-10-CM | POA: Diagnosis not present

## 2017-08-18 DIAGNOSIS — Z86010 Personal history of colonic polyps: Secondary | ICD-10-CM | POA: Diagnosis not present

## 2017-08-18 DIAGNOSIS — E785 Hyperlipidemia, unspecified: Secondary | ICD-10-CM | POA: Insufficient documentation

## 2017-08-18 DIAGNOSIS — I10 Essential (primary) hypertension: Secondary | ICD-10-CM | POA: Diagnosis not present

## 2017-08-18 DIAGNOSIS — Z79899 Other long term (current) drug therapy: Secondary | ICD-10-CM | POA: Diagnosis not present

## 2017-08-18 DIAGNOSIS — D122 Benign neoplasm of ascending colon: Secondary | ICD-10-CM | POA: Diagnosis not present

## 2017-08-18 DIAGNOSIS — M109 Gout, unspecified: Secondary | ICD-10-CM | POA: Insufficient documentation

## 2017-08-18 HISTORY — PX: POLYPECTOMY: SHX5525

## 2017-08-18 HISTORY — PX: COLONOSCOPY: SHX5424

## 2017-08-18 SURGERY — COLONOSCOPY
Anesthesia: Moderate Sedation

## 2017-08-18 MED ORDER — MEPERIDINE HCL 100 MG/ML IJ SOLN
INTRAMUSCULAR | Status: AC
  Filled 2017-08-18: qty 2

## 2017-08-18 MED ORDER — MIDAZOLAM HCL 5 MG/5ML IJ SOLN
INTRAMUSCULAR | Status: DC | PRN
Start: 2017-08-18 — End: 2017-08-18
  Administered 2017-08-18 (×3): 1 mg via INTRAVENOUS
  Administered 2017-08-18: 2 mg via INTRAVENOUS

## 2017-08-18 MED ORDER — ONDANSETRON HCL 4 MG/2ML IJ SOLN
INTRAMUSCULAR | Status: AC
  Filled 2017-08-18: qty 2

## 2017-08-18 MED ORDER — MEPERIDINE HCL 100 MG/ML IJ SOLN
INTRAMUSCULAR | Status: DC | PRN
  Administered 2017-08-18: 50 mg via INTRAVENOUS

## 2017-08-18 MED ORDER — SODIUM CHLORIDE 0.9 % IV SOLN
INTRAVENOUS | Status: DC
  Administered 2017-08-18: 1000 mL via INTRAVENOUS

## 2017-08-18 MED ORDER — ONDANSETRON HCL 4 MG/2ML IJ SOLN
INTRAMUSCULAR | Status: DC | PRN
  Administered 2017-08-18: 4 mg via INTRAVENOUS

## 2017-08-18 MED ORDER — MIDAZOLAM HCL 5 MG/5ML IJ SOLN
INTRAMUSCULAR | Status: AC
  Filled 2017-08-18: qty 10

## 2017-08-18 NOTE — Discharge Instructions (Signed)
Colon Polyps °Polyps are tissue growths inside the body. Polyps can grow in many places, including the large intestine (colon). A polyp may be a round bump or a mushroom-shaped growth. You could have one polyp or several. °Most colon polyps are noncancerous (benign). However, some colon polyps can become cancerous over time. °What are the causes? °The exact cause of colon polyps is not known. °What increases the risk? °This condition is more likely to develop in people who: °· Have a family history of colon cancer or colon polyps. °· Are older than 50 or older than 45 if they are African American. °· Have inflammatory bowel disease, such as ulcerative colitis or Crohn disease. °· Are overweight. °· Smoke cigarettes. °· Do not get enough exercise. °· Drink too much alcohol. °· Eat a diet that is: °? High in fat and red meat. °? Low in fiber. °· Had childhood cancer that was treated with abdominal radiation. ° °What are the signs or symptoms? °Most polyps do not cause symptoms. If you have symptoms, they may include: °· Blood coming from your rectum when having a bowel movement. °· Blood in your stool. The stool may look dark red or black. °· A change in bowel habits, such as constipation or diarrhea. ° °How is this diagnosed? °This condition is diagnosed with a colonoscopy. This is a procedure that uses a lighted, flexible scope to look at the inside of your colon. °How is this treated? °Treatment for this condition involves removing any polyps that are found. Those polyps will then be tested for cancer. If cancer is found, your health care provider will talk to you about options for colon cancer treatment. °Follow these instructions at home: °Diet °· Eat plenty of fiber, such as fruits, vegetables, and whole grains. °· Eat foods that are high in calcium and vitamin D, such as milk, cheese, yogurt, eggs, liver, fish, and broccoli. °· Limit foods high in fat, red meats, and processed meats, such as hot dogs, sausage,  bacon, and lunch meats. °· Maintain a healthy weight, or lose weight if recommended by your health care provider. °General instructions °· Do not smoke cigarettes. °· Do not drink alcohol excessively. °· Keep all follow-up visits as told by your health care provider. This is important. This includes keeping regularly scheduled colonoscopies. Talk to your health care provider about when you need a colonoscopy. °· Exercise every day or as told by your health care provider. °Contact a health care provider if: °· You have new or worsening bleeding during a bowel movement. °· You have new or increased blood in your stool. °· You have a change in bowel habits. °· You unexpectedly lose weight. °This information is not intended to replace advice given to you by your health care provider. Make sure you discuss any questions you have with your health care provider. °Document Released: 03/16/2004 Document Revised: 11/26/2015 Document Reviewed: 05/11/2015 °Elsevier Interactive Patient Education © 2018 [employer] ° °Colonoscopy °Discharge Instructions ° °Read the instructions outlined below and refer to this sheet in the next few weeks. These discharge instructions provide you with general information on caring for yourself after you leave the hospital. Your doctor may also give you specific instructions. While your treatment has been planned according to the most current medical practices available, unavoidable complications occasionally occur. If you have any problems or questions after discharge, call Dr. Rourk at [phone]. °ACTIVITY °· You may resume your regular activity, but move at a slower pace for the next 24   hours.  °· Take frequent rest periods for the next 24 hours.  °· Walking will help get rid of the air and reduce the bloated feeling in your belly (abdomen).  °· No driving for 24 hours (because of the medicine (anesthesia) used during the test).   °· Do not sign any important legal documents or operate any  machinery for 24 hours (because of the anesthesia used during the test).  °NUTRITION °· Drink plenty of fluids.  °· You may resume your normal diet as instructed by your doctor.  °· Begin with a light meal and progress to your normal diet. Heavy or fried foods are harder to digest and may make you feel sick to your stomach (nauseated).  °· Avoid alcoholic beverages for 24 hours or as instructed.  °MEDICATIONS °· You may resume your normal medications unless your doctor tells you otherwise.  °WHAT YOU CAN EXPECT TODAY °· Some feelings of bloating in the abdomen.  °· Passage of more gas than usual.  °· Spotting of blood in your stool or on the toilet paper.  °IF YOU HAD POLYPS REMOVED DURING THE COLONOSCOPY: °· No aspirin products for 7 days or as instructed.  °· No alcohol for 7 days or as instructed.  °· Eat a soft diet for the next 24 hours.  °FINDING OUT THE RESULTS OF YOUR TEST °Not all test results are available during your visit. If your test results are not back during the visit, make an appointment with your caregiver to find out the results. Do not assume everything is normal if you have not heard from your caregiver or the medical facility. It is important for you to follow up on all of your test results.  °SEEK IMMEDIATE MEDICAL ATTENTION IF: °· You have more than a spotting of blood in your stool.  °· Your belly is swollen (abdominal distention).  °· You are nauseated or vomiting.  °· You have a temperature over 101.  °· You have abdominal pain or discomfort that is severe or gets worse throughout the day.  ° ° ° °Colon polyp information provided ° °Further recommendations to follow pending review of pathology report °

## 2017-08-18 NOTE — H&P (Signed)
@LOGO @   Primary Care Physician:  [name], MD Primary Gastroenterologist:  Dr. Gala Romney  Pre-Procedure History & Physical: HPI:  Miguel Solis is a 67 y.o. male here for colonoscopy. Distant history of tiny adenoma removed 2008. Here for surveillance examination.  Past Medical History:  Diagnosis Date  . Colon polyp 2008  . Gout   . Hyperlipidemia   . Hypertension   . Impaired fasting glucose     Past Surgical History:  Procedure Laterality Date  . KNEE SURGERY    . KNEE SURGERY Right     Prior to Admission medications   Medication Sig Start Date End Date Taking? Authorizing Provider  atorvastatin (LIPITOR) 20 MG tablet Take 1 tablet (20 mg total) by mouth at bedtime. 06/19/17  Yes [name], MD  lisinopril (PRINIVIL,ZESTRIL) 10 MG tablet Take 1 tablet (10 mg total) by mouth daily. 06/19/17 02/17/21 Yes [name], MD  naproxen (NAPROSYN) 500 MG tablet Take 1 tablet (500 mg total) by mouth 2 (two) times daily with a meal. Prn pain Patient taking differently: Take 500 mg by mouth 2 (two) times daily as needed for mild pain or moderate pain.  10/05/16  Yes Luking, Elayne Snare, MD  polyethylene glycol-electrolytes (TRILYTE) 420 g solution Take 4,000 mLs by mouth as directed. 07/12/17  Yes Annitta Needs, NP  sildenafil (VIAGRA) 100 MG tablet Take 0.5-1 tablets (50-100 mg total) by mouth daily as needed for erectile dysfunction. 10/05/16  Yes Luking, Elayne Snare, MD  tamsulosin (FLOMAX) 0.4 MG CAPS capsule TAKE 1 CAPSULE (0.4 MG TOTAL) BY MOUTH DAILY. 01/02/17  Yes Kathyrn Drown, MD  triamcinolone cream (KENALOG) 0.1 % Apply 1 application topically 2 (two) times daily. 05/23/17  Yes [name], MD  atorvastatin (LIPITOR) 20 MG tablet Take 1 tablet (20 mg total) by mouth daily. Patient not taking: Reported on 08/09/2017 11/17/16   [name], MD  etodolac (LODINE) 400 MG tablet Take 1 tablet (400 mg total) by mouth 2 (two) times daily. Take with food 03/02/17   [name], MD  sildenafil (REVATIO) 20 MG tablet 2 or 3 tablets 2 hours prior to sex Patient not taking: Reported on 08/09/2017 10/11/16   [name], MD    Allergies as of 07/12/2017  . (No Known Allergies)    Family History  Problem Relation Age of Onset  . Cancer Mother     Social History   Socioeconomic History  . Marital status: Married    Spouse name: Not on file  . Number of children: Not on file  . Years of education: Not on file  . Highest education level: Not on file  Social Needs  . Financial resource strain: Not on file  . Food insecurity - worry: Not on file  . Food insecurity - inability: Not on file  . Transportation needs - medical: Not on file  . Transportation needs - non-medical: Not on file  Occupational History  . Not on file  Tobacco Use  . Smoking status: Never Smoker  . Smokeless tobacco: Never Used  Substance and Sexual Activity  . Alcohol use: Yes  . Drug use: No  . Sexual activity: Not on file  Other Topics Concern  . Not on file  Social History Narrative  . Not on file    Review of Systems: See HPI, otherwise negative ROS  Physical Exam: BP (!) 177/98   Pulse 100   Temp 97.9 F (36.6 C) (  Oral)   Resp 15   Ht 5\' 10"  (1.778 m)   Wt 186 lb (84.4 kg)   SpO2 97%   BMI 26.69 kg/m  General:   Alert,  Well-developed, well-nourished, pleasant and cooperative in NAD Neck:  Supple; no masses or thyromegaly. No significant cervical adenopathy. Lungs:  Clear throughout to auscultation.   No wheezes, crackles, or rhonchi. No acute distress. Heart:  Regular rate and rhythm; no murmurs, clicks, rubs,  or gallops. Abdomen: Non-distended, normal bowel sounds.  Soft and nontender without appreciable mass or hepatosplenomegaly.  Pulses:  Normal pulses noted. Extremities:  Without clubbing or edema.  Impression:   67 year old gentleman here for surveillance colonoscopy.  Recommendations: I have offered the patient a surveillance  colonoscopy today.  The risks, benefits, limitations, alternatives and imponderables have been reviewed with the patient. Potential for esophageal dilation, biopsy, etc. have also been reviewed.  Questions have been answered. All parties agreeable.        Notice: This dictation was prepared with Dragon dictation along with smaller phrase technology. Any transcriptional errors that result from this process are unintentional and may not be corrected upon review.

## 2017-08-18 NOTE — Op Note (Signed)
[facility] Patient Name: Miguel Solis Procedure Date: 08/18/2017 10:03 AM MRN: [id number] Date of Birth: [birth year] Attending MD: Norvel Richards , MD CSN: [id number] Age: 67 Admit Type: Outpatient Procedure:                Colonoscopy Indications:              High risk colon cancer surveillance: Personal                            history of colonic polyps Providers:                Norvel Richards, MD, Lurline Del, RN, Nelma Rothman, Technician Referring MD:              Medicines:                Midazolam 6 mg IV, Meperidine 50 mg IV, Ondansetron                            4 mg IV Complications:            No immediate complications. Estimated Blood Loss:     Estimated blood loss was minimal. Procedure:                Pre-Anesthesia Assessment:                           - Prior to the procedure, a History and Physical                            was performed, and patient medications and                            allergies were reviewed. The patient's tolerance of                            previous anesthesia was also reviewed. The risks                            and benefits of the procedure and the sedation                            options and risks were discussed with the patient.                            All questions were answered, and informed consent                            was obtained. Prior Anticoagulants: The patient has                            taken no previous anticoagulant or antiplatelet  agents. ASA Grade Assessment: II - A patient with                            mild systemic disease. After reviewing the risks                            and benefits, the patient was deemed in                            satisfactory condition to undergo the procedure.                           After obtaining informed consent, the colonoscope                            was passed under direct vision.  Throughout the                            procedure, the patient's blood pressure, pulse, and                            oxygen saturations were monitored continuously. The                            EC-[id number] ([id number]) scope was introduced through                            the anus and advanced to the the cecum, identified                            by appendiceal orifice and ileocecal valve. The                            ileocecal valve, appendiceal orifice, and rectum                            were photographed. The colonoscopy was performed                            without difficulty. The patient tolerated the                            procedure well. The quality of the bowel                            preparation was adequate. Scope In: 10:15:57 AM Scope Out: 10:40:38 AM Scope Withdrawal Time: 0 hours 20 minutes 53 seconds  Total Procedure Duration: 0 hours 24 minutes 41 seconds  Findings:      The perianal and digital rectal examinations were normal.      Four semi-pedunculated polyps were found in the ascending colon and       cecum. The polyps were 4 to 6 mm in size. These polyps were removed with       a cold snare. Resection and retrieval  were complete. Estimated blood       loss was minimal.      Two semi-pedunculated polyps were found in the ascending colon. The       polyps were 8 to 9 mm in size. These polyps were removed with a hot       snare. Resection and retrieval were complete. Estimated blood loss: none.      The exam was otherwise without abnormality on direct and retroflexion       views. Impression:               - Four 4 to 6 mm polyps in the ascending colon and                            in the cecum, removed with a cold snare. Resected                            and retrieved.                           - Two 8 to 9 mm polyps in the ascending colon,                            removed with a hot snare. Resected and retrieved.                           -  The examination was otherwise normal on direct                            and retroflexion views. Moderate Sedation:      Moderate (conscious) sedation was administered by the endoscopy nurse       and supervised by the endoscopist. The following parameters were       monitored: oxygen saturation, heart rate, blood pressure, respiratory       rate, EKG, adequacy of pulmonary ventilation, and response to care.       Total physician intraservice time was 27 minutes. Recommendation:           - Patient has a contact number available for                            emergencies. The signs and symptoms of potential                            delayed complications were discussed with the                            patient. Return to normal activities tomorrow.                            Written discharge instructions were provided to the                            patient.                           - Advance diet  as tolerated.                           - Continue present medications.                           - Repeat colonoscopy date to be determined after                            pending pathology results are reviewed for                            surveillance.                           - Return to GI clinic (date not yet determined). Procedure Code(s):        --- Professional ---                           [phone], Colonoscopy, flexible; with removal of                            tumor(s), polyp(s), or other lesion(s) by snare                            technique                           99152, Moderate sedation services provided by the                            same physician or other qualified health care                            professional performing the diagnostic or                            therapeutic service that the sedation supports,                            requiring the presence of an independent trained                            observer to assist in the monitoring of  the                            patient's level of consciousness and physiological                            status; initial 15 minutes of intraservice time,                            patient age 75 years or older                           [phone], Moderate sedation services;  each additional                            15 minutes intraservice time Diagnosis Code(s):        --- Professional ---                           Z86.010, Personal history of colonic polyps                           D12.0, Benign neoplasm of cecum                           D12.2, Benign neoplasm of ascending colon CPT copyright 2016 American Medical Association. All rights reserved. The codes documented in this report are preliminary and upon coder review may  be revised to meet current compliance requirements. Cristopher Estimable. Demarea Lorey, MD Norvel Richards, MD 08/18/2017 10:49:07 AM This report has been signed electronically. Number of Addenda: 0

## 2017-08-22 ENCOUNTER — Encounter (HOSPITAL_COMMUNITY): Payer: Self-pay | Admitting: Internal Medicine

## 2017-08-22 ENCOUNTER — Encounter: Payer: Self-pay | Admitting: Internal Medicine

## 2017-09-20 ENCOUNTER — Ambulatory Visit: Payer: Medicare Other | Admitting: Family Medicine

## 2017-10-09 DIAGNOSIS — Z79899 Other long term (current) drug therapy: Secondary | ICD-10-CM | POA: Diagnosis not present

## 2017-10-09 DIAGNOSIS — R739 Hyperglycemia, unspecified: Secondary | ICD-10-CM | POA: Diagnosis not present

## 2017-10-09 DIAGNOSIS — E785 Hyperlipidemia, unspecified: Secondary | ICD-10-CM | POA: Diagnosis not present

## 2017-10-09 DIAGNOSIS — Z0000 Encounter for general adult medical examination without abnormal findings: Secondary | ICD-10-CM | POA: Diagnosis not present

## 2017-10-10 LAB — LIPID PANEL
Chol/HDL Ratio: 4.7 ratio (ref 0.0–5.0)
Cholesterol, Total: 239 mg/dL — ABNORMAL HIGH (ref 100–199)
HDL: 51 mg/dL (ref >39)
LDL Calculated: 163 mg/dL — ABNORMAL HIGH (ref 0–99)
Triglycerides: 125 mg/dL (ref 0–149)
VLDL Cholesterol Cal: 25 mg/dL (ref 5–40)

## 2017-10-10 LAB — HEPATIC FUNCTION PANEL
ALK PHOS: 117 IU/L (ref 39–117)
ALT: 19 IU/L (ref 0–44)
AST: 48 IU/L — AB (ref 0–40)
Albumin: 4 g/dL (ref 3.6–4.8)
Bilirubin Total: 2.1 mg/dL — ABNORMAL HIGH (ref 0.0–1.2)
Bilirubin, Direct: 0.66 mg/dL — ABNORMAL HIGH (ref 0.00–0.40)
TOTAL PROTEIN: 7.8 g/dL (ref 6.0–8.5)

## 2017-10-10 LAB — HEMOGLOBIN A1C
ESTIMATED AVERAGE GLUCOSE: 137 mg/dL
HEMOGLOBIN A1C: 6.4 % — AB (ref 4.8–5.6)

## 2017-10-16 ENCOUNTER — Encounter: Payer: Self-pay | Admitting: Family Medicine

## 2017-10-16 ENCOUNTER — Ambulatory Visit: Payer: Medicare Other | Admitting: Family Medicine

## 2017-10-16 VITALS — BP 126/78 | Ht 67.75 in | Wt 184.0 lb

## 2017-10-16 DIAGNOSIS — E785 Hyperlipidemia, unspecified: Secondary | ICD-10-CM | POA: Diagnosis not present

## 2017-10-16 DIAGNOSIS — Z125 Encounter for screening for malignant neoplasm of prostate: Secondary | ICD-10-CM

## 2017-10-16 DIAGNOSIS — I10 Essential (primary) hypertension: Secondary | ICD-10-CM

## 2017-10-16 DIAGNOSIS — E119 Type 2 diabetes mellitus without complications: Secondary | ICD-10-CM

## 2017-10-16 DIAGNOSIS — F1010 Alcohol abuse, uncomplicated: Secondary | ICD-10-CM

## 2017-10-16 MED ORDER — LISINOPRIL 10 MG PO TABS: 10.0000 mg | ORAL_TABLET | Freq: Every day | ORAL | 1 refills | Status: DC

## 2017-10-16 MED ORDER — ATORVASTATIN CALCIUM 40 MG PO TABS: 40.0000 mg | ORAL_TABLET | Freq: Every day | ORAL | 1 refills | Status: DC

## 2017-10-16 MED ORDER — TAMSULOSIN HCL 0.4 MG PO CAPS: 0.4000 mg | ORAL_CAPSULE | Freq: Every day | ORAL | 1 refills | Status: DC

## 2017-10-16 NOTE — Progress Notes (Signed)
   Subjective:    Patient ID: Miguel Solis, male    DOB: 1951/05/13, 67 y.o.   MRN: [id number]  HPI Patient is here today to follow up on Htn.He is taking Lisinopril 10 mg one daily.He does not see any specialist. He eats healthy and exercises by walking. Bp elevated this am and states he has not taken bp meds this am.  Blood pressure medicine and blood pressure levels reviewed today with patient. Compliant with blood pressure medicine. States does not miss a dose. No obvious side effects. Blood pressure generally good when checked elsewhere. Watching salt intake.  Hx elev psa, rxed for prostatiis   Exercising reg  Results for orders placed or performed in visit on 06/19/17  Lipid panel  Result Value Ref Range   Cholesterol, Total 239 (H) 100 - 199 mg/dL   Triglycerides 125 0 - 149 mg/dL   HDL 51 >39 mg/dL   VLDL Cholesterol Cal 25 5 - 40 mg/dL   LDL Calculated 163 (H) 0 - 99 mg/dL   Chol/HDL Ratio 4.7 0.0 - 5.0 ratio  Hepatic function panel  Result Value Ref Range   Total Protein 7.8 6.0 - 8.5 g/dL   Albumin 4.0 3.6 - 4.8 g/dL   Bilirubin Total 2.1 (H) 0.0 - 1.2 mg/dL   Bilirubin, Direct 0.66 (H) 0.00 - 0.40 mg/dL   Alkaline Phosphatase 117 39 - 117 IU/L   AST 48 (H) 0 - 40 IU/L   ALT 19 0 - 44 IU/L  Hemoglobin A1c  Result Value Ref Range   Hgb A1c MFr Bld 6.4 (H) 4.8 - 5.6 %   Est. average glucose Bld gHb Est-mCnc 137 mg/dL    Working on alcohol intake    Going real good     still partic in [employer]   Patient continues to take lipid medication regularly. No obvious side effects from it. Generally does not miss a dose. Prior blood work results are reviewed with patient. Patient continues to work on fat intake in diet  Patient claims compliance with diabetes medication. No obvious side effects. Reports no substantial low sugar spells. Most numbers are generally in good range when checked fasting. Generally does not miss a dose of medication. Watching diabetic diet  closely  Review of Systems No headache, no major weight loss or weight gain, no chest pain no back pain abdominal pain no change in bowel habits complete ROS otherwise negative     Objective:   Physical Exam Alert and oriented, vitals reviewed and stable, NAD ENT-TM's and ext canals WNL bilat via otoscopic exam Soft palate, tonsils and post pharynx WNL via oropharyngeal exam Neck-symmetric, no masses; thyroid nonpalpable and nontender Pulmonary-no tachypnea or accessory muscle use; Clear without wheezes via auscultation Card--no abnrml murmurs, rhythm reg and rate WNL Carotid pulses symmetric, without bruits        Assessment & Plan:  1 impression hyperlipidemia.  Discussed.  Suboptimal.  Will increase Lipitor rationale discussed.  Exercise discussed.  2.  Hypertension good control maintain same therapy  3.  Diabetes.  A1c better.  Watching diet  4.  Alcohol abuse claims no major challenge right now.  Attending AA.  Working on things plan follow-up in 6 months for chronic plus wellness visit. Add psa

## 2017-10-17 LAB — PSA: Prostate Specific Ag, Serum: 2.7 ng/mL (ref 0.0–4.0)

## 2017-11-30 ENCOUNTER — Encounter: Payer: Self-pay | Admitting: Family Medicine

## 2017-11-30 ENCOUNTER — Ambulatory Visit: Payer: Medicare Other | Admitting: Family Medicine

## 2017-11-30 VITALS — BP 126/84 | Ht 67.75 in | Wt 180.0 lb

## 2017-11-30 DIAGNOSIS — M25561 Pain in right knee: Secondary | ICD-10-CM

## 2017-11-30 DIAGNOSIS — G8929 Other chronic pain: Secondary | ICD-10-CM | POA: Diagnosis not present

## 2017-11-30 DIAGNOSIS — I10 Essential (primary) hypertension: Secondary | ICD-10-CM | POA: Diagnosis not present

## 2017-11-30 MED ORDER — TRIAMCINOLONE ACETONIDE 0.1 % EX CREA: 1.0000 "application " | TOPICAL_CREAM | Freq: Two times a day (BID) | CUTANEOUS | 3 refills | Status: DC

## 2017-11-30 MED ORDER — METHYLPREDNISOLONE ACETATE 40 MG/ML IJ SUSP: 40.0000 mg | Freq: Once | INTRAMUSCULAR | Status: DC

## 2017-11-30 NOTE — Progress Notes (Signed)
   Subjective:    Patient ID: Miguel Solis, male    DOB: [birth year], 67 y.o.   MRN: [id number]  Knee Pain   The incident occurred at home (got off lawnmower last week and leg gave out and pt fell). The pain is present in the right knee. Quality: sore. The pain is at a severity of 1/10. He has tried nothing for the symptoms.  Pt would like shot today.  - Blood pressure medicine and blood pressure levels reviewed today with patient. Compliant with blood pressure medicine. States does not miss a dose. No obvious side effects. Blood pressure generally good when checked elsewhere. Watching salt intake.    Review of Systems No headache, no major weight loss or weight gain, no chest pain no back pain abdominal pain no change in bowel habits complete ROS otherwise negative     Objective:   Physical Exam  Alert vitals stable, NAD. Blood pressure good on repeat. HEENT normal. Lungs clear. Heart regular rate and rhythm.  Right knee swollen inflamed patient was prepped draped and anesthetized injected 1 cc Depo-Medrol 2 cc Xylocaine local measures discussed.  Wound care discussed.       Assessment & Plan:  Suboptimal control discussed compliance discussed important maintain medicine patient fortunately continuing to stay away from alcohol per his statement  2.  Knee injection.  Has helped in the past hopefully this will help local measures discussed

## 2017-12-05 ENCOUNTER — Telehealth: Payer: Self-pay | Admitting: *Deleted

## 2017-12-05 ENCOUNTER — Other Ambulatory Visit: Payer: Self-pay | Admitting: Family Medicine

## 2017-12-05 DIAGNOSIS — M25561 Pain in right knee: Principal | ICD-10-CM

## 2017-12-05 DIAGNOSIS — R296 Repeated falls: Secondary | ICD-10-CM

## 2017-12-05 DIAGNOSIS — G8929 Other chronic pain: Secondary | ICD-10-CM

## 2017-12-05 NOTE — Telephone Encounter (Signed)
Ortho referral  

## 2017-12-05 NOTE — Telephone Encounter (Signed)
Referral placed and spouse notified. 

## 2017-12-05 NOTE — Telephone Encounter (Signed)
Florine called stating patient came in and had a Cortizone shot for his knee, and patient has fell 3 times since yesterday. Florine would like to know what does she need to do for the patient. Please advise [phone]

## 2017-12-14 ENCOUNTER — Encounter: Payer: Self-pay | Admitting: Family Medicine

## 2017-12-14 ENCOUNTER — Encounter (INDEPENDENT_AMBULATORY_CARE_PROVIDER_SITE_OTHER): Payer: Self-pay

## 2017-12-20 ENCOUNTER — Encounter (INDEPENDENT_AMBULATORY_CARE_PROVIDER_SITE_OTHER): Payer: Self-pay | Admitting: Orthopaedic Surgery

## 2017-12-20 ENCOUNTER — Ambulatory Visit (INDEPENDENT_AMBULATORY_CARE_PROVIDER_SITE_OTHER): Payer: Medicare Other | Admitting: Orthopaedic Surgery

## 2017-12-20 ENCOUNTER — Ambulatory Visit (INDEPENDENT_AMBULATORY_CARE_PROVIDER_SITE_OTHER): Payer: Medicare Other

## 2017-12-20 DIAGNOSIS — G8929 Other chronic pain: Secondary | ICD-10-CM | POA: Diagnosis not present

## 2017-12-20 DIAGNOSIS — M25561 Pain in right knee: Secondary | ICD-10-CM

## 2017-12-20 MED ORDER — METHYLPREDNISOLONE ACETATE 40 MG/ML IJ SUSP
40.0000 mg | INTRAMUSCULAR | Status: AC | PRN
  Administered 2017-12-20: 40 mg via INTRA_ARTICULAR

## 2017-12-20 MED ORDER — COLCHICINE 0.6 MG PO TABS: 0.6000 mg | ORAL_TABLET | Freq: Two times a day (BID) | ORAL | 0 refills | Status: DC | PRN

## 2017-12-20 MED ORDER — LIDOCAINE HCL 1 % IJ SOLN
2.0000 mL | INTRAMUSCULAR | Status: AC | PRN
Start: 2017-12-20 — End: 2017-12-20
  Administered 2017-12-20: 2 mL

## 2017-12-20 MED ORDER — BUPIVACAINE HCL 0.5 % IJ SOLN
2.0000 mL | INTRAMUSCULAR | Status: AC | PRN
  Administered 2017-12-20: 2 mL via INTRA_ARTICULAR

## 2017-12-20 NOTE — Progress Notes (Signed)
Office Visit Note   Patient: Miguel Solis           Date of Birth: 1951-05-20           MRN: [id number] Visit Date: 12/20/2017              Requested by: Miguel Solis, [address], [place] [address] PCP: Miguel Solis, Miguel Solis   Assessment & Plan: Visit Diagnoses:  1. Chronic pain of right knee     Plan: Impression is moderate right knee degenerative joint disease with possible superimposed gout the knee was aspirated and injected with cortisone today.  Prescription for colchicine was provided today.  We will call the patient with the fluid results once this is available.  I would like to recheck him in 2 weeks to see how he is feeling.  Follow-Up Instructions: Return in about 2 weeks (around 01/03/2018).   Orders:  Orders Placed This Encounter  Procedures  . XR KNEE 3 VIEW RIGHT  . Cell count + diff,  w/ cryst-synvl fld   Meds ordered this encounter  Medications  . colchicine 0.6 MG tablet    Sig: Take 1 tablet (0.6 mg total) by mouth 2 (two) times daily as needed.    Dispense:  10 tablet    Refill:  0      Procedures: Large Joint Inj: R knee on 6/68/2019 3:59 PM Indications: pain Details: 22 G needle  Arthrogram: No  Medications: 40 mg methylPREDNISolone acetate 40 MG/ML; 2 mL lidocaine 1 %; 2 mL bupivacaine 0.5 % Aspirate: 15 mL blood-tinged; sent for lab analysis Outcome: tolerated well, no immediate complications Consent was given by the patient. Patient was prepped and draped in the usual sterile fashion.       Clinical Data: No additional findings.   Subjective: Chief Complaint  Patient presents with  . Right Knee - Pain    Miguel Solis is a very pleasant 67 year old gentleman who comes in for right knee pain that is more constant.  Denies any mechanical symptoms.  He recently had a cortisone injection May which did not help significantly.  He does have history of gout.  He does endorse fairly consistent alcohol use.  Denies any  injuries.  Denies any numbness and tingling.   Review of Systems  Constitutional: Negative.   All other systems reviewed and are negative.    Objective: Vital Signs: There were no vitals taken for this visit.  Physical Exam  Constitutional: He is oriented to person, place, and time. He appears well-developed and well-nourished.  HENT:  Head: Normocephalic and atraumatic.  Eyes: Pupils are equal, round, and reactive to light.  Neck: Neck supple.  Pulmonary/Chest: Effort normal.  Abdominal: Soft.  Musculoskeletal: Normal range of motion.  Neurological: He is alert and oriented to person, place, and time.  Skin: Skin is warm.  Psychiatric: He has a normal mood and affect. His behavior is normal. Judgment and thought content normal.  Nursing note and vitals reviewed.   Ortho Exam Shows a small joint effusion.  No signs of infection.  Collaterals and cruciates are stable. Specialty Comments:  No specialty comments available.  Imaging: Xr Knee 3 View Right  Result Date: 12/20/2017 Moderate medial compartment joint space narrowing greater than 50%    PMFS History: Patient Active Problem List   Diagnosis Date Noted  . Alcohol abuse 04/03/2017  . Prostate hypertrophy 01/10/2013  . Elevated PSA 12/05/2012  . Hyperlipemia 11/08/2012  . Essential  hypertension, benign 11/08/2012  . Hyperglycemia 11/08/2012   Past Medical History:  Diagnosis Date  . Colon polyp 2008  . Gout   . Hyperlipidemia   . Hypertension   . Impaired fasting glucose     Family History  Problem Relation Age of Onset  . Cancer Mother     Past Surgical History:  Procedure Laterality Date  . COLONOSCOPY N/A 08/18/2017   Procedure: COLONOSCOPY;  Surgeon: Daneil Dolin, Miguel Solis;  [facility];  Service: Endoscopy;  Laterality: N/A;  10:30  . KNEE SURGERY    . KNEE SURGERY Right   . POLYPECTOMY  08/18/2017   Procedure: POLYPECTOMY;  Surgeon: Daneil Dolin, Miguel Solis;  Location: [facility];   Service: Endoscopy;;  cecal x2 (cold snare)and ascending colon x2 (hot snare)   Social History   Occupational History  . Not on file  Tobacco Use  . Smoking status: Never Smoker  . Smokeless tobacco: Never Used  Substance and Sexual Activity  . Alcohol use: Yes  . Drug use: No  . Sexual activity: Not on file

## 2017-12-21 LAB — SYNOVIAL CELL COUNT + DIFF, W/ CRYSTALS
BASOPHILS, %: 1 % — AB
EOSINOPHILS-SYNOVIAL: 1 % (ref 0–2)
Lymphocytes-Synovial Fld: 63 % (ref 0–74)
Monocyte/Macrophage: 4 % (ref 0–69)
Neutrophil, Synovial: 29 % — ABNORMAL HIGH (ref 0–24)
Synoviocytes, %: 2 % (ref 0–15)
WBC, Synovial: 44 cells/uL (ref <150)

## 2017-12-21 LAB — TIQ-NTM

## 2017-12-21 NOTE — Progress Notes (Signed)
Arthritis flare up

## 2018-01-02 ENCOUNTER — Encounter (INDEPENDENT_AMBULATORY_CARE_PROVIDER_SITE_OTHER): Payer: Self-pay | Admitting: Orthopaedic Surgery

## 2018-01-02 ENCOUNTER — Ambulatory Visit (INDEPENDENT_AMBULATORY_CARE_PROVIDER_SITE_OTHER): Payer: Medicare Other | Admitting: Orthopaedic Surgery

## 2018-01-02 DIAGNOSIS — M1711 Unilateral primary osteoarthritis, right knee: Secondary | ICD-10-CM | POA: Diagnosis not present

## 2018-01-02 MED ORDER — DICLOFENAC SODIUM 1 % TD GEL: 2.0000 g | Freq: Four times a day (QID) | TRANSDERMAL | 5 refills | Status: DC

## 2018-01-02 NOTE — Progress Notes (Signed)
   Office Visit Note   Patient: Miguel Solis           Date of Birth: 27-May-1951           MRN: [id number] Visit Date: 01/02/2018              Requested by: [name], [address] [place], [place] [address] PCP: [name], MD   Assessment & Plan: Visit Diagnoses:  1. Unilateral primary osteoarthritis, right knee     Plan: Impression 67 year old gentleman with almost completely resolved right knee osteoarthritis flareup.  Voltaren gel was prescribed.  Patient is to increase activity as tolerated.  Follow-up as needed.  Follow-Up Instructions: Return if symptoms worsen or fail to improve.   Orders:  No orders of the defined types were placed in this encounter.  Meds ordered this encounter  Medications  . diclofenac sodium (VOLTAREN) 1 % GEL    Sig: Apply 2 g topically 4 (four) times daily.    Dispense:  1 Tube    Refill:  5      Procedures: No procedures performed   Clinical Data: No additional findings.   Subjective: Chief Complaint  Patient presents with  . Right Knee - Pain    Patient follows up today for his right knee pain swelling overall is doing better.  He says he is 90% better.  He has been more active.   Review of Systems   Objective: Vital Signs: There were no vitals taken for this visit.  Physical Exam  Ortho Exam Right knee exam shows a trace effusion.  Collaterals and cruciates are stable. Specialty Comments:  No specialty comments available.  Imaging: No results found.   PMFS History: Patient Active Problem List   Diagnosis Date Noted  . Alcohol abuse 04/03/2017  . Prostate hypertrophy 01/10/2013  . Elevated PSA 12/05/2012  . Hyperlipemia 11/08/2012  . Essential hypertension, benign 11/08/2012  . Hyperglycemia 11/08/2012   Past Medical History:  Diagnosis Date  . Colon polyp 2008  . Gout   . Hyperlipidemia   . Hypertension   . Impaired fasting glucose     Family History  Problem Relation  Age of Onset  . Cancer Mother     Past Surgical History:  Procedure Laterality Date  . COLONOSCOPY N/A 08/18/2017   Procedure: COLONOSCOPY;  Surgeon: Daneil Dolin, MD;  [facility];  Service: Endoscopy;  Laterality: N/A;  10:30  . KNEE SURGERY    . KNEE SURGERY Right   . POLYPECTOMY  08/18/2017   Procedure: POLYPECTOMY;  Surgeon: Daneil Dolin, MD;  Location: [facility];  Service: Endoscopy;;  cecal x2 (cold snare)and ascending colon x2 (hot snare)   Social History   Occupational History  . Not on file  Tobacco Use  . Smoking status: Never Smoker  . Smokeless tobacco: Never Used  Substance and Sexual Activity  . Alcohol use: Yes  . Drug use: No  . Sexual activity: Not on file

## 2018-01-05 ENCOUNTER — Encounter: Payer: Self-pay | Admitting: Family Medicine

## 2018-01-05 ENCOUNTER — Ambulatory Visit: Payer: Medicare Other | Admitting: Family Medicine

## 2018-01-05 VITALS — BP 132/80 | Ht 67.75 in | Wt 177.6 lb

## 2018-01-05 DIAGNOSIS — R296 Repeated falls: Secondary | ICD-10-CM | POA: Diagnosis not present

## 2018-01-05 DIAGNOSIS — F1010 Alcohol abuse, uncomplicated: Secondary | ICD-10-CM | POA: Diagnosis not present

## 2018-01-05 DIAGNOSIS — E785 Hyperlipidemia, unspecified: Secondary | ICD-10-CM | POA: Diagnosis not present

## 2018-01-05 DIAGNOSIS — I10 Essential (primary) hypertension: Secondary | ICD-10-CM

## 2018-01-05 NOTE — Progress Notes (Signed)
   Subjective:    Patient ID: Miguel Solis, male    DOB: April 25, 1951, 67 y.o.   MRN: [id number]  HPIpt arrives with daughter Miguel Solis to discuss pt's drinking problem.   Pt having serious problems with drinking  Has gotten worse and worse  Pt got another DUI--no the third  Pt fell and got on the floor and had a hard time getting up    Family wondering if there is any med to take  They also stated pt not taking chronic meds    Admits to recent challenges wih getting overdrunk   Pt feeling like the whole family is gainst him  Pt tends to binge drink and hits it hard         Review of Systems No headache, no major weight loss or weight gain, no chest pain no back pain abdominal pain no change in bowel habits complete ROS otherwise negative     Objective:   Physical Exam Alert and oriented, somewhat disheveled appearance, vitals reviewed and stable, NAD ENT-TM's and ext canals WNL bilat via otoscopic exam Soft palate, tonsils and post pharynx WNL via oropharyngeal exam Neck-symmetric, no masses; thyroid nonpalpable and nontender Pulmonary-no tachypnea or accessory muscle use; Clear without wheezes via auscultation Card--no abnrml murmurs, rhythm reg and rate WNL Carotid pulses symmetric, without bruits  Ankles trace edema pulses intact      Assessment & Plan:  Impression chronic alcohol abuse.  Now with yet another DUI.  Patient also has had falling spells.  Having substantial difficulty getting along with his wife years.  Accompanied by daughter today.  Patient agrees that he has a substantial problem with alcohol abuse.  But also frustrated about what he considers lack of respect from his family.  Ndiscussion held.  Petra Kuba of loss of the respect over time with continued abuse of alcohol within the family discussed.  Offered attempt to get patient into the patient chronic he is in agreement of outpatient therapy at this time will proceed  Greater than 50% of this  25 minute face to face visit was spent in counseling and discussion and coordination of care regarding the above diagnosis/diagnosies

## 2018-03-01 ENCOUNTER — Encounter (HOSPITAL_COMMUNITY): Payer: Self-pay | Admitting: *Deleted

## 2018-03-01 ENCOUNTER — Other Ambulatory Visit: Payer: Self-pay

## 2018-03-01 ENCOUNTER — Emergency Department (HOSPITAL_COMMUNITY)
Admission: EM | Admit: 2018-03-01 | Discharge: 2018-03-01 | Disposition: A | Discharge status: Home / Self Care | Payer: Medicare Other | Attending: Emergency Medicine | Admitting: Emergency Medicine

## 2018-03-01 ENCOUNTER — Ambulatory Visit (HOSPITAL_COMMUNITY)
Admission: RE | Admit: 2018-03-01 | Discharge: 2018-03-01 | Disposition: A | Discharge status: Home / Self Care | Payer: Medicare Other | Source: Home / Self Care | Attending: Psychiatry | Admitting: Psychiatry

## 2018-03-01 DIAGNOSIS — Z79899 Other long term (current) drug therapy: Secondary | ICD-10-CM | POA: Insufficient documentation

## 2018-03-01 DIAGNOSIS — F1010 Alcohol abuse, uncomplicated: Secondary | ICD-10-CM | POA: Diagnosis present

## 2018-03-01 DIAGNOSIS — F1020 Alcohol dependence, uncomplicated: Secondary | ICD-10-CM | POA: Diagnosis not present

## 2018-03-01 DIAGNOSIS — I10 Essential (primary) hypertension: Secondary | ICD-10-CM | POA: Insufficient documentation

## 2018-03-01 MED ORDER — CHLORDIAZEPOXIDE HCL 25 MG PO CAPS: ORAL_CAPSULE | ORAL | 0 refills | Status: DC

## 2018-03-01 MED ORDER — CHLORDIAZEPOXIDE HCL 25 MG PO CAPS
25.0000 mg | ORAL_CAPSULE | Freq: Once | ORAL | Status: AC
  Administered 2018-03-01: 25 mg via ORAL
  Filled 2018-03-01: qty 1

## 2018-03-01 NOTE — ED Notes (Signed)
Bed: [facility]4 Expected date:  Expected time:  Means of arrival:  Comments: 

## 2018-03-01 NOTE — ED Provider Notes (Signed)
[facility] DEPT Provider Note   CSN: [id number] Arrival date & time: 03/01/18  2140     History   Chief Complaint Chief Complaint  Patient presents with  . Alcohol Problem    HPI Miguel Solis is a 67 y.o. male.  HPI 67 year old male with a history of long term alcohol abuse presents the emergency department requesting detox from alcohol.  His last drink was 10 to 12 hours ago.  He went to behavioral health who told him to come in the emergency department for further evaluation.  He has no homicidal or suicidal thoughts.  No fevers or chills.  No hallucinations.  No tremors.  No chest or abdominal pain.   Past Medical History:  Diagnosis Date  . Colon polyp 2008  . Gout   . Hyperlipidemia   . Hypertension   . Impaired fasting glucose     Patient Active Problem List   Diagnosis Date Noted  . Alcohol abuse 04/03/2017  . Prostate hypertrophy 01/10/2013  . Elevated PSA 12/05/2012  . Hyperlipemia 11/08/2012  . Essential hypertension, benign 11/08/2012  . Hyperglycemia 11/08/2012    Past Surgical History:  Procedure Laterality Date  . COLONOSCOPY N/A 08/18/2017   Procedure: COLONOSCOPY;  Surgeon: Daneil Dolin, MD;  [facility];  Service: Endoscopy;  Laterality: N/A;  10:30  . KNEE SURGERY    . KNEE SURGERY Right   . POLYPECTOMY  08/18/2017   Procedure: POLYPECTOMY;  Surgeon: Daneil Dolin, MD;  Location: [facility];  Service: Endoscopy;;  cecal x2 (cold snare)and ascending colon x2 (hot snare)        Home Medications    Prior to Admission medications   Medication Sig Start Date End Date Taking? Authorizing Provider  atorvastatin (LIPITOR) 40 MG tablet Take 1 tablet (40 mg total) by mouth daily. 10/16/17  Yes [name], MD  colchicine 0.6 MG tablet Take 1 tablet (0.6 mg total) by mouth 2 (two) times daily as needed. 12/20/17  Yes Leandrew Koyanagi, MD  diclofenac sodium (VOLTAREN) 1 % GEL Apply 2 g topically 4  (four) times daily. 01/02/18  Yes Leandrew Koyanagi, MD  etodolac (LODINE) 400 MG tablet Take 1 tablet (400 mg total) by mouth 2 (two) times daily. Take with food 03/02/17  Yes [name], MD  lisinopril (PRINIVIL,ZESTRIL) 10 MG tablet Take 1 tablet (10 mg total) by mouth daily. 10/16/17 06/16/21 Yes [name], MD  naproxen (NAPROSYN) 500 MG tablet Take 1 tablet (500 mg total) by mouth 2 (two) times daily with a meal. Prn pain Patient taking differently: Take 500 mg by mouth 2 (two) times daily as needed for mild pain or moderate pain.  10/05/16  Yes Kathyrn Drown, MD  sildenafil (VIAGRA) 100 MG tablet Take 0.5-1 tablets (50-100 mg total) by mouth daily as needed for erectile dysfunction. 10/05/16  Yes Kathyrn Drown, MD  tamsulosin (FLOMAX) 0.4 MG CAPS capsule Take 1 capsule (0.4 mg total) by mouth daily. 10/16/17  Yes [name], MD  triamcinolone cream (KENALOG) 0.1 % Apply 1 application topically 2 (two) times daily. 11/30/17  Yes [name], MD  chlordiazePOXIDE (LIBRIUM) 25 MG capsule 50mg  PO TID x 1D, then 25-50mg  PO BID X 1D, then 25-50mg  PO QD X 1D 03/01/18   Jola Schmidt, MD  polyethylene glycol-electrolytes (TRILYTE) 420 g solution Take 4,000 mLs by mouth as directed. Patient not taking: Reported on 03/01/2018 07/12/17   Cyndi Bender,  Leandra Kern, NP    Family History Family History  Problem Relation Age of Onset  . Cancer Mother     Social History Social History   Tobacco Use  . Smoking status: Never Smoker  . Smokeless tobacco: Never Used  Substance Use Topics  . Alcohol use: Yes  . Drug use: No     Allergies   Patient has no known allergies.   Review of Systems Review of Systems  All other systems reviewed and are negative.    Physical Exam Updated Vital Signs BP 130/88 (BP Location: Left Arm)   Pulse (!) 101   Temp 98.1 F (36.7 C) (Oral)   Resp 18   SpO2 96%   Physical Exam  Constitutional: He is oriented to person, place, and time. He appears  well-developed and well-nourished.  HENT:  Head: Normocephalic.  Eyes: EOM are normal.  Neck: Normal range of motion.  Cardiovascular: Normal rate and regular rhythm.  Pulmonary/Chest: Effort normal and breath sounds normal.  Abdominal: Soft. He exhibits no distension.  Musculoskeletal: Normal range of motion.  Neurological: He is alert and oriented to person, place, and time.  Skin: Skin is warm.  Psychiatric: He has a normal mood and affect.  Nursing note and vitals reviewed.    ED Treatments / Results  Labs (all labs ordered are listed, but only abnormal results are displayed) Labs Reviewed - No data to display  EKG None  Radiology No results found.  Procedures Procedures (including critical care time)  Medications Ordered in ED Medications  chlordiazePOXIDE (LIBRIUM) capsule 25 mg (has no administration in time range)     Initial Impression / Assessment and Plan / ED Course  I have reviewed the triage vital signs and the nursing notes.  Pertinent labs & imaging results that were available during my care of the patient were reviewed by me and considered in my medical decision making (see chart for details).     Patient be discharged home with outpatient resources for alcohol abuse.  Home with a Librium taper.  Instructed to return the emergency department for new or worsening symptoms.  Long list of outpatient resources given.  Final Clinical Impressions(s) / ED Diagnoses   Final diagnoses:  Alcohol abuse    ED Discharge Orders         Ordered    chlordiazePOXIDE (LIBRIUM) 25 MG capsule     03/01/18 2215           Jola Schmidt, MD 03/01/18 2226

## 2018-03-01 NOTE — H&P (Signed)
Behavioral Health Medical Screening Exam  Miguel Solis is an 67 y.o. male who presents to [facility] as a walk-in with his daughter and spouse. Patient reports that he has been drinking irish rose all day. Daughter states that he has been drinking for 40 years, but since he retired a few years ago he has been drinking on a daily basis. Daughter reports that there was an incident today where the patient tried to run over his granddaughter with the riding lawnmower and the patient fell off the lawnmower. Patient states that this happened two days ago. Daughter and spouse say it happened today. Patient does have an abrasion on his right forehead. Patient and family is requesting detox and substance abuse treatment. Patient appears intoxicated at this time. Will send to [facility] for medical clearance.  Total Time spent with patient: 15 minutes  Psychiatric Specialty Exam: Physical Exam  Constitutional: He is oriented to person, place, and time. He appears well-developed and well-nourished. No distress.  Eyes: Right conjunctiva is injected. Left conjunctiva is injected.  Respiratory: Effort normal. No respiratory distress.  Neurological: He is alert and oriented to person, place, and time.  Skin: Skin is warm and dry. Abrasion and bruising noted. He is not diaphoretic.     Psychiatric: His mood appears anxious. His speech is slurred. He is not withdrawn and not actively hallucinating. Thought content is not paranoid and not delusional. He expresses inappropriate judgment. He exhibits a depressed mood. He expresses no homicidal and no suicidal ideation.    Review of Systems  Constitutional: Negative for chills, fever and weight loss.  Psychiatric/Behavioral: Positive for depression and substance abuse. Negative for hallucinations, memory loss and suicidal ideas. The patient is nervous/anxious and has insomnia.     Blood pressure 122/74, pulse 94.There is no height or weight on file to calculate BMI.  General  Appearance: Disheveled  Eye Contact:  Good  Speech:  Normal Rate and Slurred  Volume:  Normal  Mood:  Anxious, Depressed and Hopeless  Affect:  Congruent and Depressed  Thought Process:  Coherent and Descriptions of Associations: Intact  Orientation:  Full (Time, Place, and Person)  Thought Content:  Illogical and Hallucinations: None  Suicidal Thoughts:  No  Homicidal Thoughts:  No  Memory:  Immediate;   Fair Recent;   Fair  Judgement:  Impaired  Insight:  Fair  Psychomotor Activity:  Normal  Concentration: Concentration: Fair and Attention Span: Fair  Recall:  [employer] of Knowledge:Good  Language: Good  Akathisia:  NA  Handed:  Right  AIMS (if indicated):     Assets:  Communication Skills Desire for Improvement Financial Resources/Insurance Housing Intimacy Leisure Time Physical Health  Sleep:       Musculoskeletal: Strength & Muscle Tone: within normal limits Gait & Station: unsteady   Blood pressure 122/74, pulse 94.  Recommendations:  Based on my evaluation the patient does not appear to have an emergency medical condition.  Rozetta Nunnery, NP 03/01/2018, 10:07 PM

## 2018-03-01 NOTE — ED Triage Notes (Signed)
Pt reports he wants help with his alcohol problem. Last wine drink was this morning. No SI/HI or hallucinations.  Reports he did go to [facility] prior to coming to the ED and sent here for clearance.

## 2018-03-02 NOTE — BH Assessment (Signed)
Assessment Note  Miguel Solis is an 67 y.o. male.  Patient returned to [facility] on foot from being discharged from [facility].  Clinician talked to patient and did assessment.  Patient was informed that he would need to follow up on discharge instructions that the EDP gave him.  Clinician reviewed residential detox/rehab resources with him and let him know of [facility] and [facility] as detox resources in this area.  Patient reports that he has no SI, HI or A/V hallucinations.    He does admit to drinking wine.  He reports that he only drinks 1-2 "small" bottles of wine 2-3 times in a week.  According to FNP Berry's note, spouse & daughter said he is drinking daily.  Patient mentions that he has a DUI charge and his court date was continued recently.  Patient was asked if he has a psychiatrist or therapist.  He scoffed and said "I don't need a psychiatrist."  Patient has no outpatient or inpatient care history.  -Patient was picked up by his wife to return home.  Patient did not meet inpatient care criteria for [facility].  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 Colon polyp 2008  . Gout   . Hyperlipidemia   . Hypertension   . Impaired fasting glucose     Past Surgical History:  Procedure Laterality Date  . COLONOSCOPY N/A 08/18/2017   Procedure: COLONOSCOPY;  Surgeon: Daneil Dolin, MD;  [facility];  Service: Endoscopy;  Laterality: N/A;  10:30  . KNEE SURGERY    . KNEE SURGERY Right   . POLYPECTOMY  08/18/2017   Procedure: POLYPECTOMY;  Surgeon: Daneil Dolin, MD;  [facility];  Service: Endoscopy;;  cecal x2 (cold snare)and ascending colon x2 (hot snare)    Family History:  Family History  Problem Relation Age of Onset  . Cancer Mother     Social History:  reports that he has never smoked. He has never used smokeless tobacco. He reports that he drinks alcohol. He reports that he does not use drugs.  Additional  Social History:  Alcohol / Drug Use Pain Medications: See PTA medication list Prescriptions: See PTA medication list Over the Counter: See PTA medication list History of alcohol / drug use?: Yes Withdrawal Symptoms: Patient aware of relationship between substance abuse and physical/medical complications Substance #1 Name of Substance 1: ETOH 1 - Age of First Use: 89 yaers of age 75 - Amount (size/oz): 1-2 bottles of wine at a time 1 - Frequency: 2-3 times in a week 1 - Duration: off and on 1 - Last Use / Amount: 03/01/18 in AM. 1 bottle.  CIWA: CIWA-Ar BP: 122/74 Pulse Rate: 94 COWS:    Allergies: No Known Allergies  Home Medications:  (Not in a hospital admission)  OB/GYN Status:  No LMP for male patient.  General Assessment Data Location of Assessment: [facility] Assessment Services [facility]TS Assessment: In system Is this a Tele or Face-to-Face Assessment?: Face-to-Face Is this an Initial Assessment or a Re-assessment for this encounter?: Initial Assessment Patient Accompanied by:: N/A Language Other than English: No What gender do you identify as?: Male Marital status: Married Pregnancy Status: No Living Arrangements: Spouse/significant other Can pt return to current living arrangement?: Yes Admission Status: Voluntary Is patient capable of signing voluntary admission?: Yes Referral Source: Self/Family/Friend(Wife and daughter brought him to [facility]) Insurance type: Systems developer Exam ([place]) Medical  Exam completed: Yes  Crisis Care Plan Living Arrangements: Spouse/significant other Name of Psychiatrist: None Name of Therapist: None  Education Status Is patient currently in school?: No Is the patient employed, unemployed or receiving disability?: Unemployed(Retired)  Risk to self with the past 6 months Suicidal Ideation: No Has patient been a risk to self within the past 6 months prior to admission? : No Suicidal Intent: No Has patient had any  suicidal intent within the past 6 months prior to admission? : No Is patient at risk for suicide?: No Suicidal Plan?: No Has patient had any suicidal plan within the past 6 months prior to admission? : No Access to Means: No What has been your use of drugs/alcohol within the last 12 months?: ETOH Previous Attempts/Gestures: No How many times?: 0 Other Self Harm Risks: None Triggers for Past Attempts: None known Intentional Self Injurious Behavior: None Family Suicide History: No Recent stressful life event(s): Legal Issues(DUI) Persecutory voices/beliefs?: No Depression: No Depression Symptoms: (Pt denies depressive symptoms) Substance abuse history and/or treatment for substance abuse?: Yes Suicide prevention information given to non-admitted patients: Not applicable  Risk to Others within the past 6 months Homicidal Ideation: No Does patient have any lifetime risk of violence toward others beyond the six months prior to admission? : No Thoughts of Harm to Others: No Current Homicidal Intent: No Current Homicidal Plan: No Access to Homicidal Means: No Identified Victim: No one History of harm to others?: No Assessment of Violence: None Noted Violent Behavior Description: None reported Does patient have access to weapons?: No Criminal Charges Pending?: Yes Describe Pending Criminal Charges: DUI Does patient have a court date: Yes Court Date: (Case continued.) Is patient on probation?: No  Psychosis Hallucinations: None noted Delusions: None noted  Mental Status Report Appearance/Hygiene: Disheveled, Unremarkable Eye Contact: Good Motor Activity: Freedom of movement, Unremarkable Speech: Logical/coherent Level of Consciousness: Alert Mood: Anxious Affect: Anxious Anxiety Level: Minimal Thought Processes: Coherent, Relevant Judgement: Impaired Orientation: Appropriate for developmental age Obsessive Compulsive Thoughts/Behaviors: None  Cognitive  Functioning Concentration: Normal Memory: Recent Intact, Remote Intact Is patient IDD: No Insight: Poor Impulse Control: Poor Appetite: Good Have you had any weight changes? : No Change Sleep: No Change Total Hours of Sleep: 8 Vegetative Symptoms: None  ADLScreening [facility] Assessment Services) Patient's cognitive ability adequate to safely complete daily activities?: Yes Patient able to express need for assistance with ADLs?: Yes Independently performs ADLs?: Yes (appropriate for developmental age)  Prior Inpatient Therapy Prior Inpatient Therapy: No  Prior Outpatient Therapy Prior Outpatient Therapy: No Does patient have an ACCT team?: No Does patient have Intensive In-House Services?  : No Does patient have [facility] services? : No Does patient have P4CC services?: No  ADL Screening (condition at time of admission) Patient's cognitive ability adequate to safely complete daily activities?: Yes Is the patient deaf or have difficulty hearing?: No Does the patient have difficulty seeing, even when wearing glasses/contacts?: No Does the patient have difficulty concentrating, remembering, or making decisions?: No Patient able to express need for assistance with ADLs?: Yes Does the patient have difficulty dressing or bathing?: No Independently performs ADLs?: Yes (appropriate for developmental age) Does the patient have difficulty walking or climbing stairs?: No Weakness of Legs: None Weakness of Arms/Hands: None       Abuse/Neglect Assessment (Assessment to be complete while patient is alone) Abuse/Neglect Assessment Can Be Completed: Yes Physical Abuse: Denies Verbal Abuse: Denies Sexual Abuse: Denies Exploitation of patient/patient's resources: Denies Self-Neglect: Denies  Advance Directives (For Healthcare) Does Patient Have a Medical Advance Directive?: No Would patient like information on creating a medical advance directive?: No - Patient declined           Disposition:  Disposition Initial Assessment Completed for this Encounter: Yes Disposition of Patient: Discharge Patient refused recommended treatment: No Mode of transportation if patient is discharged?: Car Patient referred to: [facility], [facility]  On Site Evaluation by:   Reviewed with Physician:    Curlene Dolphin Ray 03/02/2018 12:42 AM

## 2018-03-06 ENCOUNTER — Emergency Department (HOSPITAL_COMMUNITY)
Admission: EM | Admit: 2018-03-06 | Discharge: 2018-03-07 | Disposition: A | Discharge status: Home / Self Care | Payer: Medicare Other | Attending: Emergency Medicine | Admitting: Emergency Medicine

## 2018-03-06 ENCOUNTER — Emergency Department (HOSPITAL_COMMUNITY): Payer: Medicare Other

## 2018-03-06 ENCOUNTER — Encounter (HOSPITAL_COMMUNITY): Payer: Self-pay | Admitting: Emergency Medicine

## 2018-03-06 ENCOUNTER — Other Ambulatory Visit: Payer: Self-pay

## 2018-03-06 DIAGNOSIS — R4182 Altered mental status, unspecified: Secondary | ICD-10-CM | POA: Diagnosis not present

## 2018-03-06 DIAGNOSIS — R339 Retention of urine, unspecified: Secondary | ICD-10-CM | POA: Diagnosis not present

## 2018-03-06 DIAGNOSIS — Z79899 Other long term (current) drug therapy: Secondary | ICD-10-CM | POA: Insufficient documentation

## 2018-03-06 DIAGNOSIS — F10929 Alcohol use, unspecified with intoxication, unspecified: Secondary | ICD-10-CM | POA: Insufficient documentation

## 2018-03-06 DIAGNOSIS — I10 Essential (primary) hypertension: Secondary | ICD-10-CM | POA: Insufficient documentation

## 2018-03-06 LAB — COMPREHENSIVE METABOLIC PANEL
ALBUMIN: 3.7 g/dL (ref 3.5–5.0)
ALK PHOS: 103 U/L (ref 38–126)
ALT: 25 U/L (ref 0–44)
AST: 42 U/L — AB (ref 15–41)
Anion gap: 10 (ref 5–15)
BILIRUBIN TOTAL: 1.4 mg/dL — AB (ref 0.3–1.2)
BUN: 6 mg/dL — AB (ref 8–23)
CO2: 23 mmol/L (ref 22–32)
Calcium: 8.9 mg/dL (ref 8.9–10.3)
Chloride: 104 mmol/L (ref 98–111)
Creatinine, Ser: 0.81 mg/dL (ref 0.61–1.24)
GFR calc Af Amer: >60 mL/min (ref >60)
GFR calc non Af Amer: >60 mL/min (ref >60)
Glucose, Bld: 133 mg/dL — ABNORMAL HIGH (ref 70–99)
POTASSIUM: 3.6 mmol/L (ref 3.5–5.1)
SODIUM: 137 mmol/L (ref 135–145)
TOTAL PROTEIN: 8.7 g/dL — AB (ref 6.5–8.1)

## 2018-03-06 LAB — CBC
HCT: 58.2 % — ABNORMAL HIGH (ref 39.0–52.0)
HEMOGLOBIN: 20.8 g/dL — AB (ref 13.0–17.0)
MCH: 39.2 pg — AB (ref 26.0–34.0)
MCHC: 35.7 g/dL (ref 30.0–36.0)
MCV: 109.8 fL — AB (ref 78.0–100.0)
Platelets: 225 10*3/uL (ref 150–400)
RBC: 5.3 MIL/uL (ref 4.22–5.81)
RDW: 13.7 % (ref 11.5–15.5)
WBC: 9.8 10*3/uL (ref 4.0–10.5)

## 2018-03-06 LAB — RAPID URINE DRUG SCREEN, HOSP PERFORMED
AMPHETAMINES: NOT DETECTED
BARBITURATES: NOT DETECTED
Benzodiazepines: POSITIVE — AB
Cocaine: NOT DETECTED
OPIATES: NOT DETECTED
TETRAHYDROCANNABINOL: NOT DETECTED

## 2018-03-06 LAB — ETHANOL: Alcohol, Ethyl (B): 204 mg/dL — ABNORMAL HIGH (ref <10)

## 2018-03-06 MED ORDER — VITAMIN B-1 100 MG PO TABS
100.0000 mg | ORAL_TABLET | Freq: Once | ORAL | Status: AC
  Administered 2018-03-06: 100 mg via ORAL
  Filled 2018-03-06: qty 1

## 2018-03-06 NOTE — ED Provider Notes (Signed)
Emergency Department Provider Note   I have reviewed the triage vital signs and the nursing notes.   HISTORY  Chief Complaint Alcohol Problem   HPI Miguel Solis is a 67 y.o. male with Miguel Solis history of EtOH abuse presents to the ED after two falls at home after drinking EtOH today. Family found him on the floor and called EMS. Down time estimated to be < 1 hour. Patient states that he is ready to stop drinking. He was seen in the ED in the last 4 days and started on Librium. Family has found a rehab that can take him in [place] but was told he would need to be medically cleared in the ED. Patient reports drinking today with no known head injury but is having pain and some bleeding over the right elbow since the fall. Also notes that he is not able to urinate and states he has not been able to do so since much earlier today. He does have BPH and is not taking his home meds.    Past Medical History:  Diagnosis Date  . Colon polyp 2008  . Gout   . Hyperlipidemia   . Hypertension   . Impaired fasting glucose     Patient Active Problem List   Diagnosis Date Noted  . Alcohol abuse 04/03/2017  . Prostate hypertrophy 01/10/2013  . Elevated PSA 12/05/2012  . Hyperlipemia 11/08/2012  . Essential hypertension, benign 11/08/2012  . Hyperglycemia 11/08/2012    Past Surgical History:  Procedure Laterality Date  . COLONOSCOPY N/A 08/18/2017   Procedure: COLONOSCOPY;  Surgeon: Daneil Dolin, MD;  [facility];  Service: Endoscopy;  Laterality: N/A;  10:30  . KNEE SURGERY    . KNEE SURGERY Right   . POLYPECTOMY  08/18/2017   Procedure: POLYPECTOMY;  Surgeon: Daneil Dolin, MD;  Location: [facility];  Service: Endoscopy;;  cecal x2 (cold snare)and ascending colon x2 (hot snare)    Allergies Patient has no known allergies.  Family History  Problem Relation Age of Onset  . Cancer Mother     Social History Social History   Tobacco Use  . Smoking status:  Never Smoker  . Smokeless tobacco: Never Used  Substance Use Topics  . Alcohol use: Yes  . Drug use: No    Review of Systems  Constitutional: No fever/chills Eyes: No visual changes. ENT: No sore throat. Cardiovascular: Denies chest pain. Respiratory: Denies shortness of breath. Gastrointestinal: No abdominal pain.  No nausea, no vomiting.  No diarrhea.  No constipation. Genitourinary: Negative for dysuria. Musculoskeletal: Negative for back pain. Skin: Negative for rash. Positive skin tare to right arm.  Neurological: Negative for headaches, focal weakness or numbness.  10-point ROS otherwise negative.  ____________________________________________   PHYSICAL EXAM:  VITAL SIGNS: ED Triage Vitals [03/06/18 1809]  Enc Vitals Group     BP 126/80     Pulse Rate (!) 110     Resp 15     Temp 98.1 F (36.7 C)     Temp Source Oral     SpO2 97 %     Weight 185 lb (83.9 kg)     Height 5\' 10"  (1.778 m)     Pain Score 0    Constitutional: Alert and oriented. Well appearing and in no acute distress. Eyes: Conjunctivae are normal. PERRL.  Head: Atraumatic. Nose: No congestion/rhinnorhea. Mouth/Throat: Mucous membranes are dry.  Neck: No stridor. No cervical spine tenderness to palpation. Cardiovascular: Normal rate, regular  rhythm. Good peripheral circulation. Grossly normal heart sounds.   Respiratory: Normal respiratory effort.  No retractions. Lungs CTAB. Gastrointestinal: Soft with mild suprapubic tenderness. No distention.  Musculoskeletal: No lower extremity tenderness nor edema. No gross deformities of extremities. Neurologic: Speech is slightly slurred. No gross focal neurologic deficits are appreciated.  Skin:  Skin is warm and dry. Two small areas of abrasion/skin tare to the right elbow. No laceration or active bleeding.    ____________________________________________   LABS (all labs ordered are listed, but only abnormal results are displayed)  Labs Reviewed   COMPREHENSIVE METABOLIC PANEL - Abnormal; Notable for the following components:      Result Value   Glucose, Bld 133 (*)    BUN 6 (*)    Total Protein 8.7 (*)    AST 42 (*)    Total Bilirubin 1.4 (*)    All other components within normal limits  ETHANOL - Abnormal; Notable for the following components:   Alcohol, Ethyl (B) 204 (*)    All other components within normal limits  CBC - Abnormal; Notable for the following components:   Hemoglobin 20.8 (*)    HCT 58.2 (*)    MCV 109.8 (*)    MCH 39.2 (*)    All other components within normal limits  RAPID URINE DRUG SCREEN, HOSP PERFORMED - Abnormal; Notable for the following components:   Benzodiazepines POSITIVE (*)    All other components within normal limits   ____________________________________________  RADIOLOGY  Ct Head Wo Contrast  Result Date: 03/06/2018 CLINICAL DATA:  Altered mental status EXAM: CT HEAD WITHOUT CONTRAST TECHNIQUE: Contiguous axial images were obtained from the base of the skull through the vertex without intravenous contrast. COMPARISON:  04/02/2017 FINDINGS: Brain: Mild diffuse cerebral atrophy. Low-attenuation changes in the deep white matter consistent small vessel ischemia. No mass effect or midline shift. No abnormal extra-axial fluid collections. Gray-white matter junctions are distinct. Basal cisterns are not effaced. No acute intracranial hemorrhage. Vascular: Mild intracranial arterial vascular calcifications. Dilated right middle cerebral artery. No discrete aneurysm identified. Skull: Calvarium appears intact. Sinuses/Orbits: Paranasal sinuses and mastoid air cells are clear. Other: No significant intracranial changes since prior study. IMPRESSION: No acute intracranial abnormalities. Chronic atrophy and small vessel ischemic changes. Electronically Signed   By: Lucienne Capers M.D.   On: 03/06/2018 23:34    ____________________________________________   PROCEDURES  Procedure(s) performed:    Procedures  None ____________________________________________   INITIAL IMPRESSION / ASSESSMENT AND PLAN / ED COURSE  Pertinent labs & imaging results that were available during my care of the patient were reviewed by me and considered in my medical decision making (see chart for details).  Patient presents to the ED after fall at home requesting EtOH detox. No history of complicated withdrawal. Patient is mild to moderately intoxicated on arrival but here with family member. No head trauma visible with given fall x 2 today obtained CT head which showed no acute findings. Labs show some hemo-concentration with elevated Hb. Normal platelets. Bladder scan shows > 1 L retained. Indwelling foley was placed and patient to be discharged with this in place. He will re-start his BPH meds at home and call Urology to schedule a void trial. He has a facility selected for EtOH detox and plans to go there tomorrow. Patient is already on Librium. No clinical features or exam findings to suggest [place] Encephalopathy.   At this time, I do not feel there is any life-threatening condition present. I have reviewed and  discussed all results (EKG, imaging, lab, urine as appropriate), exam findings with patient. I have reviewed nursing notes and appropriate previous records.  I feel the patient is safe to be discharged home without further emergent workup. Discussed usual and customary return precautions. Patient and family (if present) verbalize understanding and are comfortable with this plan.  Patient will follow-up with their primary care provider. If they do not have a primary care provider, information for follow-up has been provided to them. All questions have been answered.   ____________________________________________  FINAL CLINICAL IMPRESSION(S) / ED DIAGNOSES  Final diagnoses:  Alcoholic intoxication with complication ([place])  Urinary retention     MEDICATIONS GIVEN DURING THIS  VISIT:  Medications  thiamine (VITAMIN B-1) tablet 100 mg (100 mg Oral Given 03/06/18 2247)    Note:  This document was prepared using Dragon voice recognition software and may include unintentional dictation errors.  Nanda Quinton, MD Emergency Medicine    Craig Wisnewski, [facility], MD 03/07/18 [phone]

## 2018-03-06 NOTE — Discharge Instructions (Signed)
You were seen in the ED with alcohol intoxication and urinary retention. Keep the foley catheter in and call the Urologist for an appointment to remove the catheter. Call the alcohol treatment center for treatment in the coming days.

## 2018-03-06 NOTE — ED Triage Notes (Signed)
Patient presents to ED for alcohol withdrawal. Patient wants help with alcohol cessation.

## 2018-03-13 ENCOUNTER — Ambulatory Visit: Payer: Medicare Other | Admitting: Family Medicine

## 2018-03-13 ENCOUNTER — Encounter: Payer: Self-pay | Admitting: Family Medicine

## 2018-03-13 VITALS — Ht 67.75 in | Wt 181.2 lb

## 2018-03-13 DIAGNOSIS — I10 Essential (primary) hypertension: Secondary | ICD-10-CM

## 2018-03-13 DIAGNOSIS — F1010 Alcohol abuse, uncomplicated: Secondary | ICD-10-CM | POA: Diagnosis not present

## 2018-03-13 DIAGNOSIS — R296 Repeated falls: Secondary | ICD-10-CM

## 2018-03-13 MED ORDER — CHLORDIAZEPOXIDE HCL 25 MG PO CAPS: ORAL_CAPSULE | ORAL | 0 refills | Status: DC

## 2018-03-13 NOTE — Progress Notes (Signed)
   Subjective:    Patient ID: Miguel Solis, male    DOB: 10-Nov-1950, 67 y.o.   MRN: [id number]  HPI Patient arrives for a follow up on recent hospitalization for alcohol abuse.  Patient currently only taking librium and none of his other medications.  Patient was given a small number of chlordiazepoxide.  He reports this helped him stay off alcohol.  Has been to the emergency room multiple times.  Awaiting potential resident admission for alcohol abuse in [facility].  Family working with him in this regard.  Emergency room notes reviewed in presence of patient   Review of Systems No headache, no major weight loss or weight gain, no chest pain no back pain abdominal pain no change in bowel habits complete ROS otherwise negative     Objective:   Physical Exam  Alert and oriented, vitals reviewed and stable, somewhat disheveled.  Mildly depressed. ENT-TM's and ext canals WNL bilat via otoscopic exam Soft palate, tonsils and post pharynx WNL via oropharyngeal exam Neck-symmetric, no masses; thyroid nonpalpable and nontender Pulmonary-no tachypnea or accessory muscle use; Clear without wheezes via auscultation Card--no abnrml murmurs, rhythm reg and rate WNL Carotid pulses symmetric, without bruits       Assessment & Plan:  Impression alcohol abuse.  Substantial.  Status post multiple emergency room visits.  Awaits potential rehab in [place].  Had a fall with multiple contusions.  X-rays negative.  Would like to try more clot chlordiazepoxide.  States it definitely helps.  This is discussed with patient and family.  I have advised family to not allow patient to drive.  Potential benefit of inpatient management discussed.  Patient states not suicidal or homicidal.  He is aware if he keeps drinking he will probably because life-threatening or life ending damage/discussed

## 2018-03-14 DIAGNOSIS — N401 Enlarged prostate with lower urinary tract symptoms: Secondary | ICD-10-CM | POA: Diagnosis not present

## 2018-03-14 DIAGNOSIS — R338 Other retention of urine: Secondary | ICD-10-CM | POA: Diagnosis not present

## 2018-03-17 ENCOUNTER — Emergency Department (HOSPITAL_COMMUNITY)
Admission: EM | Admit: 2018-03-17 | Discharge: 2018-03-17 | Disposition: A | Discharge status: Home / Self Care | Payer: Medicare Other | Attending: Emergency Medicine | Admitting: Emergency Medicine

## 2018-03-17 ENCOUNTER — Encounter (HOSPITAL_COMMUNITY): Payer: Self-pay | Admitting: *Deleted

## 2018-03-17 ENCOUNTER — Other Ambulatory Visit: Payer: Self-pay

## 2018-03-17 DIAGNOSIS — N179 Acute kidney failure, unspecified: Secondary | ICD-10-CM | POA: Diagnosis not present

## 2018-03-17 DIAGNOSIS — I10 Essential (primary) hypertension: Secondary | ICD-10-CM | POA: Insufficient documentation

## 2018-03-17 DIAGNOSIS — R339 Retention of urine, unspecified: Secondary | ICD-10-CM | POA: Diagnosis not present

## 2018-03-17 DIAGNOSIS — N139 Obstructive and reflux uropathy, unspecified: Secondary | ICD-10-CM | POA: Diagnosis not present

## 2018-03-17 LAB — BASIC METABOLIC PANEL
Anion gap: 16 — ABNORMAL HIGH (ref 5–15)
BUN: 30 mg/dL — ABNORMAL HIGH (ref 8–23)
CALCIUM: 8.8 mg/dL — AB (ref 8.9–10.3)
CO2: 16 mmol/L — AB (ref 22–32)
CREATININE: 2.91 mg/dL — AB (ref 0.61–1.24)
Chloride: 103 mmol/L (ref 98–111)
GFR calc non Af Amer: 21 mL/min — ABNORMAL LOW (ref >60)
GFR, EST AFRICAN AMERICAN: 24 mL/min — AB (ref >60)
Glucose, Bld: 128 mg/dL — ABNORMAL HIGH (ref 70–99)
Potassium: 3.8 mmol/L (ref 3.5–5.1)
Sodium: 135 mmol/L (ref 135–145)

## 2018-03-17 LAB — CBC WITH DIFFERENTIAL/PLATELET
BASOS PCT: 0 %
Basophils Absolute: 0 10*3/uL (ref 0.0–0.1)
EOS ABS: 0.4 10*3/uL (ref 0.0–0.7)
Eosinophils Relative: 3 %
HEMATOCRIT: 52.5 % — AB (ref 39.0–52.0)
HEMOGLOBIN: 18.8 g/dL — AB (ref 13.0–17.0)
Lymphocytes Relative: 13 %
Lymphs Abs: 1.9 10*3/uL (ref 0.7–4.0)
MCH: 39.7 pg — AB (ref 26.0–34.0)
MCHC: 35.8 g/dL (ref 30.0–36.0)
MCV: 110.8 fL — ABNORMAL HIGH (ref 78.0–100.0)
MONO ABS: 1.1 10*3/uL — AB (ref 0.1–1.0)
MONOS PCT: 8 %
NEUTROS ABS: 10.8 10*3/uL — AB (ref 1.7–7.7)
NEUTROS PCT: 76 %
Platelets: 207 10*3/uL (ref 150–400)
RBC: 4.74 MIL/uL (ref 4.22–5.81)
RDW: 14 % (ref 11.5–15.5)
WBC: 14.2 10*3/uL — ABNORMAL HIGH (ref 4.0–10.5)

## 2018-03-17 LAB — I-STAT CHEM 8, ED
BUN: 33 mg/dL — AB (ref 8–23)
CALCIUM ION: 1.02 mmol/L — AB (ref 1.15–1.40)
CREATININE: 2.9 mg/dL — AB (ref 0.61–1.24)
Chloride: 103 mmol/L (ref 98–111)
Glucose, Bld: 127 mg/dL — ABNORMAL HIGH (ref 70–99)
HCT: 55 % — ABNORMAL HIGH (ref 39.0–52.0)
Hemoglobin: 18.7 g/dL — ABNORMAL HIGH (ref 13.0–17.0)
Potassium: 3.9 mmol/L (ref 3.5–5.1)
Sodium: 134 mmol/L — ABNORMAL LOW (ref 135–145)
TCO2: 16 mmol/L — AB (ref 22–32)

## 2018-03-17 MED ORDER — LIDOCAINE HCL URETHRAL/MUCOSAL 2 % EX GEL
1.0000 "application " | Freq: Once | CUTANEOUS | Status: AC
  Administered 2018-03-17: 1 via URETHRAL
  Filled 2018-03-17: qty 10

## 2018-03-17 MED ORDER — SODIUM CHLORIDE 0.9 % IV BOLUS
500.0000 mL | Freq: Once | INTRAVENOUS | Status: AC
  Administered 2018-03-17: 500 mL via INTRAVENOUS

## 2018-03-17 NOTE — ED Provider Notes (Signed)
Emergency Department Provider Note   I have reviewed the triage vital signs and the nursing notes.   HISTORY  Chief Complaint Urinary Retention   HPI Miguel Solis is a 67 y.o. male with PMH of HLD, HTN, EtOH abuse, and BPH with urinary retention presents to the emergency department with urinary retention symptoms.  She was seen by myself on 9/3 with request for alcohol treatment referral and secondary complaint of urinary retention at that time.  He was discharged home with a Foley catheter in place and followed up with urology last week to have it removed.  He states it was bothering him and he successfully completed a void trial in the office.  Since that time, his urine output has worsened.  He has frequent urination without pain but he has to force urine out and it is only in small amounts.  He has been compliant with his Flomax.  No fevers or chills.  No back pain.  He does have some lower abdominal fullness and generalized discomfort.   His alcohol use he is awaiting outpatient treatment facility placement.  He has not had alcohol in the past 4 days.  He completed a Librium taper, prescribed during the last ED visit, and states that has helped him significantly.   Past Medical History:  Diagnosis Date  . Colon polyp 2008  . Gout   . Hyperlipidemia   . Hypertension   . Impaired fasting glucose     Patient Active Problem List   Diagnosis Date Noted  . Alcohol abuse 04/03/2017  . Prostate hypertrophy 01/10/2013  . Elevated PSA 12/05/2012  . Hyperlipemia 11/08/2012  . Essential hypertension, benign 11/08/2012  . Hyperglycemia 11/08/2012    Past Surgical History:  Procedure Laterality Date  . COLONOSCOPY N/A 08/18/2017   Procedure: COLONOSCOPY;  Surgeon: Daneil Dolin, MD;  [facility];  Service: Endoscopy;  Laterality: N/A;  10:30  . KNEE SURGERY    . KNEE SURGERY Right   . POLYPECTOMY  08/18/2017   Procedure: POLYPECTOMY;  Surgeon: Daneil Dolin, MD;   [facility];  Service: Endoscopy;;  cecal x2 (cold snare)and ascending colon x2 (hot snare)    Allergies Patient has no known allergies.  Family History  Problem Relation Age of Onset  . Cancer Mother     Social History Social History   Tobacco Use  . Smoking status: Never Smoker  . Smokeless tobacco: Never Used  Substance Use Topics  . Alcohol use: Yes  . Drug use: No    Review of Systems  Constitutional: No fever/chills Eyes: No visual changes. ENT: No sore throat. Cardiovascular: Denies chest pain. Respiratory: Denies shortness of breath. Gastrointestinal: No abdominal pain.  No nausea, no vomiting.  No diarrhea.  No constipation. Genitourinary: Negative for dysuria. Urinary retention.  Musculoskeletal: Negative for back pain. Skin: Negative for rash. Neurological: Negative for headaches, focal weakness or numbness.  10-point ROS otherwise negative.  ____________________________________________   PHYSICAL EXAM:  VITAL SIGNS: ED Triage Vitals  Enc Vitals Group     BP 03/17/18 2040 112/73     Pulse Rate 03/17/18 2040 (!) 105     Resp 03/17/18 2040 20     Temp 03/17/18 2040 98.7 F (37.1 C)     Temp Source 03/17/18 2040 Oral     SpO2 03/17/18 2040 98 %     Weight 03/17/18 2039 185 lb (83.9 kg)     Height 03/17/18 2039 5\' 10"  (1.778 m)  Pain Score 03/17/18 2039 0   Constitutional: Alert and oriented. Well appearing and in no acute distress. Eyes: Conjunctivae are normal.  Head: Atraumatic. Nose: No congestion/rhinnorhea. Mouth/Throat: Mucous membranes are moist.  Neck: No stridor.  Cardiovascular: Normal rate, regular rhythm. Good peripheral circulation. Grossly normal heart sounds.   Respiratory: Normal respiratory effort.  No retractions. Lungs CTAB. Gastrointestinal: Soft and nontender. No distention.  Musculoskeletal: No lower extremity tenderness nor edema. No gross deformities of extremities. Neurologic:  Normal speech and  language. No gross focal neurologic deficits are appreciated.  Skin:  Skin is warm, dry and intact. No rash noted.  ____________________________________________   LABS (all labs ordered are listed, but only abnormal results are displayed)  Labs Reviewed  BASIC METABOLIC PANEL - Abnormal; Notable for the following components:      Result Value   CO2 16 (*)    Glucose, Bld 128 (*)    BUN 30 (*)    Creatinine, Ser 2.91 (*)    Calcium 8.8 (*)    GFR calc non Af Amer 21 (*)    GFR calc Af Amer 24 (*)    Anion gap 16 (*)    All other components within normal limits  CBC WITH DIFFERENTIAL/PLATELET - Abnormal; Notable for the following components:   WBC 14.2 (*)    Hemoglobin 18.8 (*)    HCT 52.5 (*)    MCV 110.8 (*)    MCH 39.7 (*)    Neutro Abs 10.8 (*)    Monocytes Absolute 1.1 (*)    All other components within normal limits  I-STAT CHEM 8, ED - Abnormal; Notable for the following components:   Sodium 134 (*)    BUN 33 (*)    Creatinine, Ser 2.90 (*)    Glucose, Bld 127 (*)    Calcium, Ion 1.02 (*)    TCO2 16 (*)    Hemoglobin 18.7 (*)    HCT 55.0 (*)    All other components within normal limits   ____________________________________________  RADIOLOGY  None ____________________________________________   PROCEDURES  Procedure(s) performed:   Procedures  None ____________________________________________   INITIAL IMPRESSION / ASSESSMENT AND PLAN / ED COURSE  Pertinent labs & imaging results that were available during my care of the patient were reviewed by me and considered in my medical decision making (see chart for details).  Patient presents to the emergency department with urinary retention.  Bladder scan shows greater than 1 L of urine in the bladder.  Discussed replaced Foley and patient consents to the procedure.  I-STAT chemistry shows significantly worsening renal function likely secondary to obstructive etiology.  Plan to repeat chemistry with  non-i-STAT labs and give gentle IV fluids.  Patient will need PCP follow-up in the next 2 days for repeat chemistry.   Repeat labs confirm AKI. Plan for close PCP follow up and labs. Patient to return to the ED if PCP cannot recheck labs in the next 48 hours.   At this time, I do not feel there is any life-threatening condition present. I have reviewed and discussed all results (EKG, imaging, lab, urine as appropriate), exam findings with patient. I have reviewed nursing notes and appropriate previous records.  I feel the patient is safe to be discharged home without further emergent workup. Discussed usual and customary return precautions. Patient and family (if present) verbalize understanding and are comfortable with this plan.  Patient will follow-up with their primary care provider. If they do not have a primary care  provider, information for follow-up has been provided to them. All questions have been answered.  ____________________________________________  FINAL CLINICAL IMPRESSION(S) / ED DIAGNOSES  Final diagnoses:  Urinary retention  Obstructive uropathy  AKI (acute kidney injury) ([place])     MEDICATIONS GIVEN DURING THIS VISIT:  Medications  lidocaine (XYLOCAINE) 2 % jelly 1 application (1 application Urethral Given 03/17/18 2112)  sodium chloride 0.9 % bolus 500 mL ( Intravenous Stopped 03/17/18 2248)    Note:  This document was prepared using Dragon voice recognition software and may include unintentional dictation errors.  Nanda Quinton, MD Emergency Medicine    Long, W[facility] Old[facility], MD 03/18/18 [phone]

## 2018-03-17 NOTE — Discharge Instructions (Signed)
You were seen in the ED today with urine obstruction. You have a foley catheter back in place and will need to call the Urologist again for follow up to decide when to remove the catheter. Call your PCP on Monday, first thing, to schedule a visit for repeat labs. Your kidney function is worse and the foley catheter should fix this but you will need to be sure the kidneys are nor getting worse. If your doctor cannot repeat labs on Monday or Tuesday you should return to the ED for repeat blood work and evaluation.

## 2018-03-17 NOTE — ED Triage Notes (Signed)
Pt states that he feels like he does not have a strong stream of urine, recently had a foley placed and removed,

## 2018-03-19 ENCOUNTER — Encounter: Payer: Medicare Other | Admitting: Family Medicine

## 2018-03-19 ENCOUNTER — Telehealth: Payer: Self-pay | Admitting: Family Medicine

## 2018-03-19 DIAGNOSIS — I10 Essential (primary) hypertension: Secondary | ICD-10-CM

## 2018-03-19 NOTE — Telephone Encounter (Signed)
Pt had to go back to ER on Saturday due to not being able to urinate. They put the cath back in. He has a hospital follow up scheduled for Wednesday 03/21/18. The hospital suggested that urgent kidney labs be done and a follow up. Pt would like to get lab work done tomorrow so Dr. Richardson Landry will have the results for the appt on the 18th. CB# [id number]

## 2018-03-19 NOTE — Telephone Encounter (Signed)
Blood work ordered in [employer]. Patient notified and stated he thinks he follows up with urology next week.

## 2018-03-19 NOTE — Telephone Encounter (Signed)
The ER also strongly receommended f u with urology too, pt needs to make sure he does that, met 7 plz

## 2018-03-20 DIAGNOSIS — I10 Essential (primary) hypertension: Secondary | ICD-10-CM | POA: Diagnosis not present

## 2018-03-21 ENCOUNTER — Encounter: Payer: Self-pay | Admitting: Family Medicine

## 2018-03-21 ENCOUNTER — Ambulatory Visit: Payer: Medicare Other | Admitting: Family Medicine

## 2018-03-21 VITALS — BP 114/80 | Temp 98.1°F | Ht 70.0 in | Wt 176.0 lb

## 2018-03-21 DIAGNOSIS — R7989 Other specified abnormal findings of blood chemistry: Secondary | ICD-10-CM

## 2018-03-21 DIAGNOSIS — N320 Bladder-neck obstruction: Secondary | ICD-10-CM | POA: Diagnosis not present

## 2018-03-21 LAB — BASIC METABOLIC PANEL
BUN/Creatinine Ratio: 11 (ref 10–24)
BUN: 12 mg/dL (ref 8–27)
CALCIUM: 9.9 mg/dL (ref 8.6–10.2)
CHLORIDE: 101 mmol/L (ref 96–106)
CO2: 25 mmol/L (ref 20–29)
Creatinine, Ser: 1.09 mg/dL (ref 0.76–1.27)
GFR calc non Af Amer: 70 mL/min/{1.73_m2} (ref >59)
GFR, EST AFRICAN AMERICAN: 81 mL/min/{1.73_m2} (ref >59)
GLUCOSE: 125 mg/dL — AB (ref 65–99)
Potassium: 4.8 mmol/L (ref 3.5–5.2)
Sodium: 142 mmol/L (ref 134–144)

## 2018-03-21 NOTE — Progress Notes (Signed)
   Subjective:    Patient ID: Miguel Solis, male    DOB: Jul 25, 1950, 67 y.o.   MRN: [id number]  HPI Pt here today for hospital follow up on kidneys. Pt was at ED on 03/17/18 for urinary retention. Pt states he had a catheter on Thursday and Saturday. Pt was having problems passing urine.  Results for orders placed or performed in visit on 01/74/94  Basic metabolic panel  Result Value Ref Range   Glucose 125 (H) 65 - 99 mg/dL   BUN 12 8 - 27 mg/dL   Creatinine, Ser 1.09 0.76 - 1.27 mg/dL   GFR calc non Af Amer 70 >59 mL/min/1.73   GFR calc Af Amer 81 >59 mL/min/1.73   BUN/Creatinine Ratio 11 10 - 24   Sodium 142 134 - 144 mmol/L   Potassium 4.8 3.5 - 5.2 mmol/L   Chloride 101 96 - 106 mmol/L   CO2 25 20 - 29 mmol/L   Calcium 9.9 8.6 - 10.2 mg/dL   Visit with urologist still pending  Seen once again the emergency room once again with obstruction.  Creatinine was quite a bit elevated. Review of Systems No headache, no major weight loss or weight gain, no chest pain no back pain abdominal pain no change in bowel habits complete ROS otherwise negative     Objective:   Physical Exam  Alert vitals stable, NAD. Blood pressure good on repeat. HEENT normal. Lungs clear. Heart regular rate and rhythm.       Assessment & Plan:  Impression acute renal insufficiency with elevated creatinine in the middle twos.  Now has resolved nicely down to 1.09.  Discussed continued catheter in until patient sees urologist.  Petra Kuba of outlet obstruction discussed with patient multiple questions answered

## 2018-03-23 ENCOUNTER — Emergency Department (HOSPITAL_COMMUNITY)
Admission: EM | Admit: 2018-03-23 | Discharge: 2018-03-23 | Disposition: A | Discharge status: Home / Self Care | Payer: Medicare Other | Attending: Emergency Medicine | Admitting: Emergency Medicine

## 2018-03-23 ENCOUNTER — Other Ambulatory Visit: Payer: Self-pay

## 2018-03-23 ENCOUNTER — Encounter (HOSPITAL_COMMUNITY): Payer: Self-pay

## 2018-03-23 DIAGNOSIS — T83098A Other mechanical complication of other indwelling urethral catheter, initial encounter: Secondary | ICD-10-CM | POA: Insufficient documentation

## 2018-03-23 DIAGNOSIS — I10 Essential (primary) hypertension: Secondary | ICD-10-CM | POA: Diagnosis not present

## 2018-03-23 DIAGNOSIS — R339 Retention of urine, unspecified: Secondary | ICD-10-CM | POA: Diagnosis not present

## 2018-03-23 DIAGNOSIS — Z79899 Other long term (current) drug therapy: Secondary | ICD-10-CM | POA: Diagnosis not present

## 2018-03-23 DIAGNOSIS — Y732 Prosthetic and other implants, materials and accessory gastroenterology and urology devices associated with adverse incidents: Secondary | ICD-10-CM | POA: Diagnosis not present

## 2018-03-23 LAB — URINALYSIS, ROUTINE W REFLEX MICROSCOPIC
BILIRUBIN URINE: NEGATIVE
Glucose, UA: NEGATIVE mg/dL
Ketones, ur: NEGATIVE mg/dL
Nitrite: POSITIVE — AB
Protein, ur: 100 mg/dL — AB
RBC / HPF: >50 RBC/hpf — ABNORMAL HIGH (ref 0–5)
SPECIFIC GRAVITY, URINE: 1.009 (ref 1.005–1.030)
WBC, UA: >50 WBC/hpf — ABNORMAL HIGH (ref 0–5)
pH: 9 — ABNORMAL HIGH (ref 5.0–8.0)

## 2018-03-23 MED ORDER — LIDOCAINE HCL URETHRAL/MUCOSAL 2 % EX GEL
CUTANEOUS | Status: AC
  Administered 2018-03-23: 23:00:00
  Filled 2018-03-23: qty 10

## 2018-03-23 NOTE — Discharge Instructions (Addendum)
I am glad we were able to provide you with some relief today. Do not remove this catheter until you see urology next week.   Return to the ED if the catheter does not drain as it is supposed to.  Your urine showed bacteria in it which we expect in patients with a catheter. Your urine culture is pending. No antibiotics will be prescribed today. Our pharmacists will call you if you need additional antibiotics.  Thank you for allowing me to take care of you today!

## 2018-03-23 NOTE — ED Triage Notes (Signed)
Pt states that it was working fine until today Abd pain feels full.

## 2018-03-23 NOTE — ED Provider Notes (Signed)
[facility] EMERGENCY DEPARTMENT Provider Note  CSN: [id number] Arrival date & time: 03/23/18  1935    History   Chief Complaint Chief Complaint  Patient presents with  . Urinary Retention    Leg Bag    HPI Miguel Solis is a 67 y.o. male with a medical history of BPH and HTN who presented to the ED for urinary retention x1 day. Onset of retention was this morning. He reports waking up with blood around the tip of his penis and no urine in his leg bag. Patient reports the catheter working appropriately prior to going to bed. He states this current catheter was placed 2 weeks ago because of prostate issues. Patient states that he has an upcoming appointment with urology on Tuesday 03/27/18. Currently endorses lower abdomen pain and distention. Denies fever, chills, changes in bowel habits or changes in appetite.     Past Medical History:  Diagnosis Date  . Colon polyp 2008  . Gout   . Hyperlipidemia   . Hypertension   . Impaired fasting glucose     Patient Active Problem List   Diagnosis Date Noted  . Alcohol abuse 04/03/2017  . Prostate hypertrophy 01/10/2013  . Elevated PSA 12/05/2012  . Hyperlipemia 11/08/2012  . Essential hypertension, benign 11/08/2012  . Hyperglycemia 11/08/2012    Past Surgical History:  Procedure Laterality Date  . COLONOSCOPY N/A 08/18/2017   Procedure: COLONOSCOPY;  Surgeon: Daneil Dolin, MD;  [facility];  Service: Endoscopy;  Laterality: N/A;  10:30  . KNEE SURGERY    . KNEE SURGERY Right   . POLYPECTOMY  08/18/2017   Procedure: POLYPECTOMY;  Surgeon: Daneil Dolin, MD;  Location: [facility];  Service: Endoscopy;;  cecal x2 (cold snare)and ascending colon x2 (hot snare)        Home Medications    Prior to Admission medications   Medication Sig Start Date End Date Taking? Authorizing Provider  atorvastatin (LIPITOR) 40 MG tablet Take 1 tablet (40 mg total) by mouth daily. 10/16/17   [name], MD    chlordiazePOXIDE (LIBRIUM) 25 MG capsule 2 TABS PO TID x 1D, then 2 TABS PO BID X 1D, then ONE TAB PO BID X 14D Patient taking differently: Take 25 mg by mouth 2 (two) times daily.  03/13/18   [name], MD  colchicine 0.6 MG tablet Take 1 tablet (0.6 mg total) by mouth 2 (two) times daily as needed. 12/20/17   Leandrew Koyanagi, MD  diclofenac sodium (VOLTAREN) 1 % GEL Apply 2 g topically 4 (four) times daily. 01/02/18   Leandrew Koyanagi, MD  lisinopril (PRINIVIL,ZESTRIL) 10 MG tablet Take 1 tablet (10 mg total) by mouth daily. 10/16/17 06/16/21  [name], MD  tamsulosin (FLOMAX) 0.4 MG CAPS capsule Take 1 capsule (0.4 mg total) by mouth daily. 10/16/17   [name], MD  triamcinolone cream (KENALOG) 0.1 % Apply 1 application topically 2 (two) times daily. 11/30/17   [name], MD    Family History Family History  Problem Relation Age of Onset  . Cancer Mother     Social History Social History   Tobacco Use  . Smoking status: Never Smoker  . Smokeless tobacco: Never Used  Substance Use Topics  . Alcohol use: Yes  . Drug use: No     Allergies   Patient has no known allergies.   Review of Systems Review of Systems  Constitutional: Negative for chills and fever.  Respiratory: Negative.   Cardiovascular: Negative.   Gastrointestinal: Positive for abdominal distention and abdominal pain. Negative for blood in stool, constipation, diarrhea, nausea and vomiting.  Genitourinary: Positive for decreased urine volume and difficulty urinating. Negative for discharge, penile pain, scrotal swelling and testicular pain.       Foley catheter in place.  Skin: Negative.    Physical Exam Updated Vital Signs BP (!) 144/84   Pulse (!) 103   Temp 98.6 F (37 C) (Oral)   Resp 20   SpO2 96%   Physical Exam  Constitutional: He appears well-developed and well-nourished.  Pacing in the room in discomfort.  Cardiovascular: Normal rate, regular rhythm and normal heart  sounds.  Pulmonary/Chest: Effort normal and breath sounds normal.  Abdominal: Normal appearance and bowel sounds are normal. He exhibits distension. There is tenderness in the suprapubic area.  Genitourinary: Testes normal and penis normal. Right testis shows no swelling and no tenderness. Left testis shows no swelling and no tenderness. Uncircumcised. No penile erythema or penile tenderness. No discharge found.  Genitourinary Comments: Dried blood seen around the glans and proximal portion of catheter. No urine in leg bag.  Skin: Skin is warm and intact. Capillary refill takes less than 2 seconds.  Nursing note and vitals reviewed.  ED Treatments / Results  Labs (all labs ordered are listed, but only abnormal results are displayed) Labs Reviewed  URINALYSIS, ROUTINE W REFLEX MICROSCOPIC - Abnormal; Notable for the following components:      Result Value   APPearance CLOUDY (*)    pH 9.0 (*)    Hgb urine dipstick MODERATE (*)    Protein, ur 100 (*)    Nitrite POSITIVE (*)    Leukocytes, UA LARGE (*)    RBC / HPF >50 (*)    WBC, UA >50 (*)    Bacteria, UA RARE (*)    Non Squamous Epithelial 0-5 (*)    All other components within normal limits  URINE CULTURE    EKG None  Radiology No results found.  Procedures Procedures (including critical care time)  Medications Ordered in ED Medications  lidocaine (XYLOCAINE) 2 % jelly (  Given 03/23/18 2303)     Initial Impression / Assessment and Plan / ED Course  Triage vital signs and the nursing notes have been reviewed.  Pertinent labs & imaging results that were available during care of the patient were reviewed and considered in medical decision making (see chart for details).  Patient presents to the ED for urinary retention which began this morning. Patient reports having a Foley catheter for the past 2 weeks for urinary retention 2/2 BPH. He states that the catheter was working normally before he went to bed last night, but  this morning there was no urine in the bag and there was blood around the glans of his penis. Physical exam significant for lower abdomen distention and tenderness. No urine seen in leg bag and given the blood it is likely that patient's urethra experienced trauma overnight from tugging of the catheter and that there is blood clotted in the catheter that is blocking appropriate flow. Will perform bladder scan and attempt to flush Foley today.    Clinical Course as of Mar 23 2348  Fri Mar 23, 2018  2233 RN reported that flush of Foley was unsuccessful. This provider advised nursing staff to replace patient's Foley.   [GM]  2335 New Foley catheter successfully placed. Contents of bag are cloudy and blood tinged. RN reports  blood clots in previous catheter tubing. Patient reports significant relief. Advised to continue follow-up with urology next week.   [GM]  2343 UA indicative of UTI which is expected given pt has had catheter for extended period of time. Urine culture pending. He is asymptomatic. Once urine culture results, pharmacy will call the patient and follow-up on symptoms and need for antibiotics.   [GM]    Clinical Course User Index [GM] Mortis, Jonelle Sports, PA-C    Final Clinical Impressions(s) / ED Diagnoses  1. Urinary Retention. Likely due to clotted blood in catheter tubing from traumatic injury to urethra overnight. New Foley placed today in the ED. Patient has scheduled follow-up with urology on Tuesday 03/27/18. Urine collected sent for culture.  Dispo: Home. After thorough clinical evaluation, this patient is determined to be medically stable and can be safely discharged with the previously mentioned treatment and/or outpatient follow-up/referral(s). At this time, there are no other apparent medical conditions that require further screening, evaluation or treatment.   Final diagnoses:  Urinary retention    ED Discharge Orders    None        Romie Jumper,  PA-C 03/23/18 [place], Sully, DO 03/24/18 1559

## 2018-03-23 NOTE — ED Notes (Signed)
Foley catheter inserted without difficulty. Pt tolerated well. Bladder draining.

## 2018-03-26 LAB — URINE CULTURE

## 2018-03-27 ENCOUNTER — Telehealth: Payer: Self-pay | Admitting: Emergency Medicine

## 2018-03-27 ENCOUNTER — Telehealth (HOSPITAL_COMMUNITY): Payer: Self-pay | Admitting: Pharmacist

## 2018-03-27 DIAGNOSIS — R338 Other retention of urine: Secondary | ICD-10-CM | POA: Diagnosis not present

## 2018-03-27 DIAGNOSIS — N401 Enlarged prostate with lower urinary tract symptoms: Secondary | ICD-10-CM | POA: Diagnosis not present

## 2018-03-27 NOTE — Progress Notes (Signed)
ED Antimicrobial Stewardship Positive Culture Follow Up   Ardis Fullwood Spohr is an 67 y.o. male who presented to [facility] on (Not on file) with a chief complaint of No chief complaint on file.   Recent Results (from the past 720 hour(s))  Urine culture     Status: Abnormal   Collection Time: 03/23/18 10:58 PM  Result Value Ref Range Status   Specimen Description   Final    URINE, CATHETERIZED Performed at [facility], [address] [place], [place] [address]    Special Requests   Final    NONE Performed at [facility], [address], [place], [place] [address]    Culture >=100,000 COLONIES/mL PROTEUS MIRABILIS (A)  Final   Report Status 03/26/2018 FINAL  Final   Organism ID, Bacteria PROTEUS MIRABILIS (A)  Final      Susceptibility   Proteus mirabilis - MIC*    AMPICILLIN <=2 SENSITIVE Sensitive     CEFAZOLIN <=4 SENSITIVE Sensitive     CEFTRIAXONE <=1 SENSITIVE Sensitive     CIPROFLOXACIN >=4 RESISTANT Resistant     GENTAMICIN 8 INTERMEDIATE Intermediate     IMIPENEM 1 SENSITIVE Sensitive     NITROFURANTOIN 128 RESISTANT Resistant     TRIMETH/SULFA >=320 RESISTANT Resistant     AMPICILLIN/SULBACTAM <=2 SENSITIVE Sensitive     PIP/TAZO <=4 SENSITIVE Sensitive     * >=100,000 COLONIES/mL PROTEUS MIRABILIS    []  Treated with  organism resistant to prescribed antimicrobial []  Patient discharged originally without antimicrobial agent and treatment is now indicated  New antibiotic prescription: Keflex 500 mg po bid x 7days  ED Provider: B. Durward Parcel   Harvel Quale 03/27/2018, 10:27 AM Clinical Pharmacist Monday - Friday phone -  [phone] Saturday - Sunday phone - [phone]

## 2018-03-27 NOTE — Telephone Encounter (Signed)
Post ED Visit - Positive Culture Follow-up: Successful Patient Follow-Up  Culture assessed and recommendations reviewed by:  []  Elenor Quinones, Pharm.D. []  Heide Guile, Pharm.D., BCPS AQ-ID []  Parks Neptune, Pharm.D., BCPS []  Alycia Rossetti, Pharm.D., BCPS []  [place], Pharm.D., BCPS, AAHIVP []  Legrand Como, Pharm.D., BCPS, AAHIVP []  Salome Arnt, PharmD, BCPS []  Johnnette Gourd, PharmD, BCPS [x]  Hughes Better, PharmD, BCPS []  Leeroy Cha, PharmD  Positive urine culture  [x]  Patient discharged without antimicrobial prescription and treatment is now indicated []  Organism is resistant to prescribed ED discharge antimicrobial []  Patient with positive blood cultures  Changes discussed with ED provider: Nuala Alpha PA New antibiotic prescription start Keflex 500 mg po bid x 7 days Called to [facility] [phone]  Contacted daughter 03/27/2018 Malone, Antwine Agosto 03/27/2018, 11:19 AM

## 2018-04-05 DIAGNOSIS — R338 Other retention of urine: Secondary | ICD-10-CM | POA: Diagnosis not present

## 2018-04-13 ENCOUNTER — Emergency Department (HOSPITAL_COMMUNITY)
Admission: EM | Admit: 2018-04-13 | Discharge: 2018-04-13 | Disposition: A | Discharge status: Home / Self Care | Payer: Medicare Other | Attending: Emergency Medicine | Admitting: Emergency Medicine

## 2018-04-13 ENCOUNTER — Encounter (HOSPITAL_COMMUNITY): Payer: Self-pay | Admitting: Emergency Medicine

## 2018-04-13 ENCOUNTER — Other Ambulatory Visit: Payer: Self-pay

## 2018-04-13 DIAGNOSIS — Y658 Other specified misadventures during surgical and medical care: Secondary | ICD-10-CM | POA: Diagnosis not present

## 2018-04-13 DIAGNOSIS — Z79899 Other long term (current) drug therapy: Secondary | ICD-10-CM | POA: Diagnosis not present

## 2018-04-13 DIAGNOSIS — I10 Essential (primary) hypertension: Secondary | ICD-10-CM | POA: Diagnosis not present

## 2018-04-13 DIAGNOSIS — N3001 Acute cystitis with hematuria: Secondary | ICD-10-CM | POA: Diagnosis not present

## 2018-04-13 DIAGNOSIS — T83098A Other mechanical complication of other indwelling urethral catheter, initial encounter: Secondary | ICD-10-CM | POA: Diagnosis present

## 2018-04-13 LAB — URINALYSIS, ROUTINE W REFLEX MICROSCOPIC
BILIRUBIN URINE: NEGATIVE
GLUCOSE, UA: NEGATIVE mg/dL
KETONES UR: NEGATIVE mg/dL
NITRITE: POSITIVE — AB
PROTEIN: 100 mg/dL — AB
Specific Gravity, Urine: 1.012 (ref 1.005–1.030)
WBC, UA: >50 WBC/hpf — ABNORMAL HIGH (ref 0–5)
pH: 8 (ref 5.0–8.0)

## 2018-04-13 MED ORDER — CEPHALEXIN 500 MG PO CAPS: 500.0000 mg | ORAL_CAPSULE | Freq: Four times a day (QID) | ORAL | 0 refills | Status: DC

## 2018-04-13 MED ORDER — CEPHALEXIN 500 MG PO CAPS
500.0000 mg | ORAL_CAPSULE | Freq: Once | ORAL | Status: AC
  Administered 2018-04-13: 500 mg via ORAL
  Filled 2018-04-13: qty 1

## 2018-04-13 NOTE — Discharge Instructions (Addendum)
Follow-up with your urologist as scheduled next week.  Drink plenty of fluids

## 2018-04-13 NOTE — ED Provider Notes (Signed)
[facility] EMERGENCY DEPARTMENT Provider Note   CSN: [id number] Arrival date & time: 04/13/18  1505     History   Chief Complaint Chief Complaint  Patient presents with  . Foley Catheter Problem    HPI Miguel Solis is a 67 y.o. male.  Patient states he got a Foley and and it is not draining.  The history is provided by the patient. No language interpreter was used.  Illness  This is a new problem. The current episode started 6 to 12 hours ago. The problem occurs constantly. The problem has not changed since onset.Pertinent negatives include no chest pain, no abdominal pain and no headaches. Nothing aggravates the symptoms. Nothing relieves the symptoms. He has tried nothing for the symptoms. The treatment provided no relief.    Past Medical History:  Diagnosis Date  . Colon polyp 2008  . Gout   . Hyperlipidemia   . Hypertension   . Impaired fasting glucose     Patient Active Problem List   Diagnosis Date Noted  . Alcohol abuse 04/03/2017  . Prostate hypertrophy 01/10/2013  . Elevated PSA 12/05/2012  . Hyperlipemia 11/08/2012  . Essential hypertension, benign 11/08/2012  . Hyperglycemia 11/08/2012    Past Surgical History:  Procedure Laterality Date  . COLONOSCOPY N/A 08/18/2017   Procedure: COLONOSCOPY;  Surgeon: Daneil Dolin, MD;  [facility];  Service: Endoscopy;  Laterality: N/A;  10:30  . KNEE SURGERY    . KNEE SURGERY Right   . POLYPECTOMY  08/18/2017   Procedure: POLYPECTOMY;  Surgeon: Daneil Dolin, MD;  [facility]: [facility];  Service: Endoscopy;;  cecal x2 (cold snare)and ascending colon x2 (hot snare)        Home Medications    Prior to Admission medications   Medication Sig Start Date End Date Taking? Authorizing Provider  atorvastatin (LIPITOR) 40 MG tablet Take 1 tablet (40 mg total) by mouth daily. 10/16/17   [name], MD  cephALEXin (KEFLEX) 500 MG capsule Take 1 capsule (500 mg total) by mouth 4 (four) times  daily. 04/13/18   Milton Ferguson, MD  chlordiazePOXIDE (LIBRIUM) 25 MG capsule 2 TABS PO TID x 1D, then 2 TABS PO BID X 1D, then ONE TAB PO BID X 14D Patient taking differently: Take 25 mg by mouth 2 (two) times daily.  03/13/18   [name], MD  colchicine 0.6 MG tablet Take 1 tablet (0.6 mg total) by mouth 2 (two) times daily as needed. 12/20/17   Leandrew Koyanagi, MD  diclofenac sodium (VOLTAREN) 1 % GEL Apply 2 g topically 4 (four) times daily. 01/02/18   Leandrew Koyanagi, MD  lisinopril (PRINIVIL,ZESTRIL) 10 MG tablet Take 1 tablet (10 mg total) by mouth daily. 10/16/17 06/16/21  [name], MD  tamsulosin (FLOMAX) 0.4 MG CAPS capsule Take 1 capsule (0.4 mg total) by mouth daily. 10/16/17   [name], MD  triamcinolone cream (KENALOG) 0.1 % Apply 1 application topically 2 (two) times daily. 11/30/17   [name], MD    Family History Family History  Problem Relation Age of Onset  . Cancer Mother     Social History Social History   Tobacco Use  . Smoking status: Never Smoker  . Smokeless tobacco: Never Used  Substance Use Topics  . Alcohol use: Yes    Comment: rarely  . Drug use: No     Allergies   Patient has no known allergies.   Review of  Systems Review of Systems  Constitutional: Negative for appetite change and fatigue.  HENT: Negative for congestion, ear discharge and sinus pressure.   Eyes: Negative for discharge.  Respiratory: Negative for cough.   Cardiovascular: Negative for chest pain.  Gastrointestinal: Negative for abdominal pain and diarrhea.  Genitourinary: Negative for frequency and hematuria.       Clogged Foley  Musculoskeletal: Negative for back pain.  Skin: Negative for rash.  Neurological: Negative for seizures and headaches.  Psychiatric/Behavioral: Negative for hallucinations.     Physical Exam Updated Vital Signs BP 139/86 (BP Location: Right Arm)   Pulse (!) 118   Temp 98.2 F (36.8 C) (Oral)   Resp 20   Ht 5\' 10"   (1.778 m)   Wt 82.1 kg   SpO2 98%   BMI 25.97 kg/m   Physical Exam  Constitutional: He is oriented to person, place, and time. He appears well-developed.  HENT:  Head: Normocephalic.  Eyes: Conjunctivae and EOM are normal. No scleral icterus.  Neck: Neck supple. No thyromegaly present.  Cardiovascular: Normal rate and regular rhythm. Exam reveals no gallop and no friction rub.  No murmur heard. Pulmonary/Chest: No stridor. He has no wheezes. He has no rales. He exhibits no tenderness.  Abdominal: He exhibits no distension. There is no tenderness. There is no rebound.  Musculoskeletal: Normal range of motion. He exhibits no edema.  Lymphadenopathy:    He has no cervical adenopathy.  Neurological: He is oriented to person, place, and time. He exhibits normal muscle tone. Coordination normal.  Skin: No rash noted. No erythema.  Psychiatric: He has a normal mood and affect. His behavior is normal.     ED Treatments / Results  Labs (all labs ordered are listed, but only abnormal results are displayed) Labs Reviewed  URINALYSIS, ROUTINE W REFLEX MICROSCOPIC - Abnormal; Notable for the following components:      Result Value   Color, Urine AMBER (*)    APPearance TURBID (*)    Hgb urine dipstick SMALL (*)    Protein, ur 100 (*)    Nitrite POSITIVE (*)    Leukocytes, UA LARGE (*)    RBC / HPF >50 (*)    WBC, UA >50 (*)    Bacteria, UA FEW (*)    All other components within normal limits  URINE CULTURE    EKG None  Radiology No results found.  Procedures Procedures (including critical care time)  Medications Ordered in ED Medications  cephALEXin (KEFLEX) capsule 500 mg (has no administration in time range)     Initial Impression / Assessment and Plan / ED Course  I have reviewed the triage vital signs and the nursing notes.  Pertinent labs & imaging results that were available during my care of the patient were reviewed by me and considered in my medical decision  making (see chart for details).     Foley was replaced.  Urinalysis shows UTI.  Patient will be placed on Keflex and he has an appointment next week with his urologist  Final Clinical Impressions(s) / ED Diagnoses   Final diagnoses:  Acute cystitis with hematuria    ED Discharge Orders         Ordered    cephALEXin (KEFLEX) 500 MG capsule  4 times daily     04/13/18 Aldona Lento, MD 04/13/18 1925

## 2018-04-13 NOTE — ED Triage Notes (Signed)
Patient complaining of foley catheter not draining any urine since last night.

## 2018-04-16 LAB — URINE CULTURE

## 2018-04-17 ENCOUNTER — Telehealth: Payer: Self-pay | Admitting: Emergency Medicine

## 2018-04-17 DIAGNOSIS — R338 Other retention of urine: Secondary | ICD-10-CM | POA: Diagnosis not present

## 2018-04-17 NOTE — Telephone Encounter (Signed)
Post ED Visit - Positive Culture Follow-up  Culture report reviewed by antimicrobial stewardship pharmacist:  []  Elenor Quinones, Pharm.D. []  Heide Guile, Pharm.D., BCPS AQ-ID []  [place], Pharm.D., BCPS []  Alycia Rossetti, Pharm.D., BCPS []  [place], Pharm.D., BCPS, AAHIVP []  Legrand Como, Pharm.D., BCPS, AAHIVP []  Salome Arnt, PharmD, BCPS []  Johnnette Gourd, PharmD, BCPS []  Hughes Better, PharmD, BCPS []  Leeroy Cha, PharmD Elicia Lamp PharmD  Positive urine culture Treated with cephalexin, organism sensitive to the same and no further patient follow-up is required at this time.  Hazle Nordmann 04/17/2018, 9:54 AM

## 2018-04-18 ENCOUNTER — Encounter: Payer: Medicare Other | Admitting: Family Medicine

## 2018-04-20 ENCOUNTER — Other Ambulatory Visit: Payer: Self-pay | Admitting: Urology

## 2018-04-25 DIAGNOSIS — R338 Other retention of urine: Secondary | ICD-10-CM | POA: Diagnosis not present

## 2018-04-30 ENCOUNTER — Encounter (HOSPITAL_BASED_OUTPATIENT_CLINIC_OR_DEPARTMENT_OTHER): Payer: Self-pay | Admitting: *Deleted

## 2018-04-30 ENCOUNTER — Other Ambulatory Visit: Payer: Self-pay

## 2018-04-30 NOTE — Progress Notes (Signed)
Spoke with [employer] after midnight , arrive 645 am [facility] 05-04-18 meds to take sip of water:atorvastatin Patient given ower instructions including bring all prescription medications in original containers day of surgery Surgery orders in epic Spoke with dr germeroth face to face about patient medical history and ekg 05-05-17 results, order obtained from dr germeroth to repeat ekg and get I stat 4 day of surgery. ekg 05-05-17 on chart/epic

## 2018-05-01 ENCOUNTER — Other Ambulatory Visit: Payer: Self-pay | Admitting: Urology

## 2018-05-03 NOTE — Discharge Instructions (Signed)

## 2018-05-03 NOTE — H&P (Signed)
HPI: Miguel Solis is a 67 year-old male with urinary retention who presents today for TURP.  His problem was discovered 03/17/2018. His current symptoms did not begin after he had a surgical procedure. He currently has an indwelling catheter. The catheter has been in his bladder for 03/17/2018. His urinary retention is being treated with foley catheter and flomax.   He does not have pain with urination. He does not have to strain or bear down to start his urinary stream. His urine has shut off completely.   He is not having problems with urinary control or incontinence.   He has not previously had an indwelling catheter in for more than two weeks at a time. This condition would be considered of mild to moderate severity with no modifying factors or associated signs or symptoms other than as noted above.   03/14/18: He was seen in the emergency room on 03/06/18 for a history of ethanol abuse and recent falls. He noted he had been unable to urinate and a bladder scan revealed greater than 1 L in the bladder. A Foley catheter was placed. He indicated he was taking medications for BPH but had not been taking them recently.  He told me that he had an episode where his urine really slow down but he did not developed urinary retention and was then started on the Flomax. Once he started voiding back to his baseline he stop taking the Flomax. He subsequently has restarted that and remains on the Flomax at this time. He reports that there was approximately a 1000 cc in his bladder when the catheter was inserted although I have no documentation of that.   03/27/18: He underwent an apparently successful voiding trial on 03/14/18 and was maintained on tamsulosin. He ended up having to go to the emergency room on 03/17/18 due to decreased urine output and was found to have greater than a liter in his bladder by bladder scan and an elevated creatinine of 2.9 suggesting obstructive uropathy and a Foley catheter was reinserted.    04/17/18: He returns today having undergone urodynamics to evaluate his persistent urinary retention.     ALLERGIES: No Allergies    MEDICATIONS: Flomax  LevoFLOXacin 500 MG Oral Tablet 1 Oral Daily  Lisinopril 10 MG Oral Tablet Oral     GU PSH: Complex cystometrogram, w/ void pressure and urethral pressure profile studies, any technique - 04/05/2018 Complex Uroflow - 04/05/2018, 03/14/2018 Emg surf Electrd - 04/05/2018 Intrabd voidng Press - 04/05/2018      PSH Notes: Arthrotomy Of Knee With Open Meniscus Repair   NON-GU PSH: Repair Knee Cartilage - 2014    GU PMH: Urinary Retention (Stable), For now his urinary retention will be managed with continued indwelling Foley catheter. - 03/27/2018, He underwent a successful voiding trial today. He is going to remain on tamsulosin. I am going to have him force fluids throughout the morning so that if he is unable to urinate he can return for catheter re-insertion., - 03/14/2018 BPH w/LUTS (Stable), He has BPH. His prostate is examined on a annual basis by Dr. Geralyn Flash and his PSA earlier this year was completely normal at 2.7. - 03/14/2018 Elevated PSA, Elevated prostate specific antigen (PSA) - 2014      PMH Notes: Elevated PSA: He had a single episode of PSA elevation of 4.26 in 5/14. A repeat PSA was found to be 2.1.     NON-GU PMH: Gout, Gout - 2014 Personal history of other diseases of the circulatory  system, History of hypertension - 2014 Personal history of other endocrine, nutritional and metabolic disease, History of hypercholesterolemia - 2014 Encounter for general adult medical examination without abnormal findings, Encounter for preventive health examination    FAMILY HISTORY: Death In The Family Father - Father Death In The Family Mother - Mother   SOCIAL HISTORY: None    Notes: Never A Smoker, Marital History - Currently Married, Occupation:, Caffeine Use, Alcohol Use   REVIEW OF SYSTEMS:    GU Review Male:   Patient  denies frequent urination, hard to postpone urination, burning/ pain with urination, get up at night to urinate, leakage of urine, stream starts and stops, trouble starting your stream, have to strain to urinate , erection problems, and penile pain.  Gastrointestinal (Upper):   Patient denies nausea, vomiting, and indigestion/ heartburn.  Gastrointestinal (Lower):   Patient denies diarrhea and constipation.  Constitutional:   Patient denies fever, night sweats, weight loss, and fatigue.  Skin:   Patient denies skin rash/ lesion and itching.  Eyes:   Patient denies blurred vision and double vision.  Ears/ Nose/ Throat:   Patient denies sore throat and sinus problems.  Hematologic/Lymphatic:   Patient denies easy bruising and swollen glands.  Cardiovascular:   Patient denies leg swelling and chest pains.  Respiratory:   Patient denies cough and shortness of breath.  Endocrine:   Patient denies excessive thirst.  Musculoskeletal:   Patient denies back pain and joint pain.  Neurological:   Patient denies headaches and dizziness.  Psychologic:   Patient denies depression and anxiety.   VITAL SIGNS:    Weight 175 lb / 79.38 kg  Height 70 in / 177.8 cm  BP 145/75 mmHg  Pulse 66 /min  BMI 25.1 kg/m   GU PHYSICAL EXAMINATION:    Anus and Perineum: No hemorrhoids. No anal stenosis. No rectal fissure, no anal fissure. No edema, no dimple, no perineal tenderness, no anal tenderness.  Scrotum: No lesions. No edema. No cysts. No warts.  Epididymides: Right: no spermatocele, no masses, no cysts, no tenderness, no induration, no enlargement. Left: no spermatocele, no masses, no cysts, no tenderness, no induration, no enlargement.  Testes: No tenderness, no swelling, no enlargement left testes. No tenderness, no swelling, no enlargement right testes. Normal location left testes. Normal location right testes. No mass, no cyst, no varicocele, no hydrocele left testes. No mass, no cyst, no varicocele, no  hydrocele right testes.  Urethral Meatus: Normal size. No lesion, no wart, no discharge, no polyp. Normal location.  Penis: Circumcised, no warts, no cracks. No dorsal Peyronie's plaques, no left corporal Peyronie's plaques, no right corporal Peyronie's plaques, no scarring, no warts. No balanitis, no meatal stenosis.  Prostate: 40 gram or 2+ size. Left lobe normal consistency, right lobe normal consistency. Symmetrical lobes. No prostate nodule. Left lobe no tenderness, right lobe no tenderness.  Seminal Vesicles: Nonpalpable.  Sphincter Tone: Normal sphincter. No rectal tenderness. No rectal mass.    MULTI-SYSTEM PHYSICAL EXAMINATION:    Constitutional: Well-nourished. No physical deformities. Normally developed. Good grooming.  Neck: Neck symmetrical, not swollen. Normal tracheal position.  Respiratory: No labored breathing, no use of accessory muscles.   Cardiovascular: Normal temperature, normal extremity pulses, no swelling, no varicosities.  Lymphatic: No enlargement of neck, axillae, groin.  Skin: No paleness, no jaundice, no cyanosis. No lesion, no ulcer, no rash.  Neurologic / Psychiatric: Oriented to time, oriented to place, oriented to person. No depression, no anxiety, no agitation.  Gastrointestinal: No mass,  no tenderness, no rigidity, non obese abdomen.  Eyes: Normal conjunctivae. Normal eyelids.  Ears, Nose, Mouth, and Throat: Left ear no scars, no lesions, no masses. Right ear no scars, no lesions, no masses. Nose no scars, no lesions, no masses. Normal hearing. Normal lips.  Musculoskeletal: Normal gait and station of head and neck.    PAST DATA REVIEWED:  Source Of History:  Patient  Urodynamics Review:   Review Urodynamics Tests   10/16/17 01/03/13  PSA  Total PSA 2.7 ng/dl 2.10    Notes:                     Urodynamics 04/05/18: Study was performed to evaluate urinary retention. He   CMG: He was found to have a normal cystometric capacity of 425 cc with some  detrusor instability with a contraction occurring at 341 cc to 44 cm H2O. Next I would pressure-flow: He was able to generate a voluntary contraction generating pressures a 74 cm H2O but was unable to void.   Fluoroscopy: I do see elevation of the bladder base from a large prostate.   Impression: Although he has some instability he is in urinary retention and would be a good candidate for outlet resistance surgery.     PROCEDURES: None   ASSESSMENT/PLAN:      ICD-10 Details  1 GU:   Urinary Retention - R33.8 Stable - Based on the results of his urodynamic study we discussed the options for management of his urinary retention. He would like to be catheter free and we discussed intermittent self catheterization as 1 option but it really does not address the underlying problem and therefore I discussed surgical means for reduction of outlet resistance. We discussed various ways of performing this including Urolift and surgical removal of prostate tissue transurethrally. I primarily discussed transurethral resection of the prostate and we went over this procedure in detail. I drew him pictures. We went over the risks and complications associated with this surgery as well as the probability of success, the need for an overnight stay in the hospital and the anticipated postoperative course. All of his questions were answered to his satisfaction and he would like to proceed with transurethral resection of the prostate. He will be scheduled for a TURP.  A preoperative urine culture was positive for Proteus and he was placed on Augmentin which she remained on until his day of surgery.

## 2018-05-04 ENCOUNTER — Other Ambulatory Visit: Payer: Self-pay

## 2018-05-04 ENCOUNTER — Ambulatory Visit (HOSPITAL_BASED_OUTPATIENT_CLINIC_OR_DEPARTMENT_OTHER): Payer: Medicare Other | Admitting: Certified Registered Nurse Anesthetist

## 2018-05-04 ENCOUNTER — Observation Stay (HOSPITAL_BASED_OUTPATIENT_CLINIC_OR_DEPARTMENT_OTHER)
Admission: RE | Admit: 2018-05-04 | Discharge: 2018-05-06 | Disposition: A | Discharge status: Home / Self Care | Payer: Medicare Other | Source: Ambulatory Visit | Attending: Urology | Admitting: Urology

## 2018-05-04 ENCOUNTER — Encounter (HOSPITAL_COMMUNITY)
Admission: RE | Disposition: A | Discharge status: Home / Self Care | Payer: Self-pay | Source: Ambulatory Visit | Attending: Urology

## 2018-05-04 ENCOUNTER — Encounter (HOSPITAL_BASED_OUTPATIENT_CLINIC_OR_DEPARTMENT_OTHER): Payer: Self-pay | Admitting: Certified Registered Nurse Anesthetist

## 2018-05-04 DIAGNOSIS — R338 Other retention of urine: Secondary | ICD-10-CM | POA: Insufficient documentation

## 2018-05-04 DIAGNOSIS — E7800 Pure hypercholesterolemia, unspecified: Secondary | ICD-10-CM | POA: Diagnosis not present

## 2018-05-04 DIAGNOSIS — Z79899 Other long term (current) drug therapy: Secondary | ICD-10-CM | POA: Insufficient documentation

## 2018-05-04 DIAGNOSIS — N138 Other obstructive and reflux uropathy: Secondary | ICD-10-CM | POA: Diagnosis present

## 2018-05-04 DIAGNOSIS — I10 Essential (primary) hypertension: Secondary | ICD-10-CM | POA: Insufficient documentation

## 2018-05-04 DIAGNOSIS — N401 Enlarged prostate with lower urinary tract symptoms: Secondary | ICD-10-CM | POA: Diagnosis not present

## 2018-05-04 DIAGNOSIS — N320 Bladder-neck obstruction: Secondary | ICD-10-CM | POA: Diagnosis not present

## 2018-05-04 HISTORY — PX: TRANSURETHRAL RESECTION OF PROSTATE: SHX73

## 2018-05-04 HISTORY — DX: Presence of urogenital implants: Z96.0

## 2018-05-04 HISTORY — DX: Presence of other specified devices: Z97.8

## 2018-05-04 HISTORY — DX: Retention of urine, unspecified: R33.9

## 2018-05-04 HISTORY — DX: Hyperlipidemia, unspecified: E78.5

## 2018-05-04 LAB — POCT I-STAT 4, (NA,K, GLUC, HGB,HCT)
Glucose, Bld: 148 mg/dL — ABNORMAL HIGH (ref 70–99)
HCT: 55 % — ABNORMAL HIGH (ref 39.0–52.0)
Hemoglobin: 18.7 g/dL — ABNORMAL HIGH (ref 13.0–17.0)
Potassium: 3.7 mmol/L (ref 3.5–5.1)
Sodium: 139 mmol/L (ref 135–145)

## 2018-05-04 SURGERY — TURP (TRANSURETHRAL RESECTION OF PROSTATE)
Anesthesia: General | Site: Prostate

## 2018-05-04 MED ORDER — FENTANYL CITRATE (PF) 100 MCG/2ML IJ SOLN
INTRAMUSCULAR | Status: AC
  Filled 2018-05-04: qty 2

## 2018-05-04 MED ORDER — BACITRACIN-NEOMYCIN-POLYMYXIN 400-5-5000 EX OINT
1.0000 "application " | TOPICAL_OINTMENT | Freq: Three times a day (TID) | CUTANEOUS | Status: DC | PRN
  Filled 2018-05-04: qty 1

## 2018-05-04 MED ORDER — HYDROCODONE-ACETAMINOPHEN 5-325 MG PO TABS
1.0000 | ORAL_TABLET | ORAL | Status: DC | PRN
  Administered 2018-05-04 (×3): 1 via ORAL
  Filled 2018-05-04: qty 2

## 2018-05-04 MED ORDER — BELLADONNA ALKALOIDS-OPIUM 16.2-60 MG RE SUPP
1.0000 | Freq: Four times a day (QID) | RECTAL | Status: DC | PRN
  Administered 2018-05-04: 1 via RECTAL
  Filled 2018-05-04: qty 1

## 2018-05-04 MED ORDER — SODIUM CHLORIDE 0.9 % IR SOLN
3000.0000 mL | Status: DC
  Administered 2018-05-04 – 2018-05-06 (×7): 3000 mL
  Filled 2018-05-04: qty 3000

## 2018-05-04 MED ORDER — PHENYLEPHRINE 40 MCG/ML (10ML) SYRINGE FOR IV PUSH (FOR BLOOD PRESSURE SUPPORT)
PREFILLED_SYRINGE | INTRAVENOUS | Status: DC | PRN
  Administered 2018-05-04: 80 ug via INTRAVENOUS
  Administered 2018-05-04: 40 ug via INTRAVENOUS
  Administered 2018-05-04 (×3): 80 ug via INTRAVENOUS
  Administered 2018-05-04: 40 ug via INTRAVENOUS

## 2018-05-04 MED ORDER — PHENYLEPHRINE 40 MCG/ML (10ML) SYRINGE FOR IV PUSH (FOR BLOOD PRESSURE SUPPORT)
PREFILLED_SYRINGE | INTRAVENOUS | Status: AC
  Filled 2018-05-04: qty 10

## 2018-05-04 MED ORDER — ONDANSETRON HCL 4 MG/2ML IJ SOLN
4.0000 mg | INTRAMUSCULAR | Status: DC | PRN
  Filled 2018-05-04: qty 2

## 2018-05-04 MED ORDER — HYDROCODONE-ACETAMINOPHEN 5-325 MG PO TABS
ORAL_TABLET | ORAL | Status: AC
  Filled 2018-05-04: qty 1

## 2018-05-04 MED ORDER — ACETAMINOPHEN 500 MG PO TABS
ORAL_TABLET | ORAL | Status: AC
  Filled 2018-05-04: qty 2

## 2018-05-04 MED ORDER — ONDANSETRON HCL 4 MG/2ML IJ SOLN
4.0000 mg | Freq: Once | INTRAMUSCULAR | Status: DC | PRN
  Filled 2018-05-04: qty 2

## 2018-05-04 MED ORDER — MIDAZOLAM HCL 2 MG/2ML IJ SOLN
INTRAMUSCULAR | Status: DC | PRN
  Administered 2018-05-04: 1 mg via INTRAVENOUS

## 2018-05-04 MED ORDER — OXYCODONE HCL 5 MG/5ML PO SOLN
5.0000 mg | Freq: Once | ORAL | Status: DC | PRN
  Filled 2018-05-04: qty 5

## 2018-05-04 MED ORDER — FENTANYL CITRATE (PF) 100 MCG/2ML IJ SOLN
25.0000 ug | INTRAMUSCULAR | Status: DC | PRN
  Filled 2018-05-04: qty 1

## 2018-05-04 MED ORDER — LIDOCAINE HCL (CARDIAC) PF 100 MG/5ML IV SOSY
PREFILLED_SYRINGE | INTRAVENOUS | Status: DC | PRN
  Administered 2018-05-04: 60 mg via INTRAVENOUS

## 2018-05-04 MED ORDER — TRAMADOL HCL 50 MG PO TABS: 50.0000 mg | ORAL_TABLET | Freq: Four times a day (QID) | ORAL | 0 refills | Status: DC | PRN

## 2018-05-04 MED ORDER — MIDAZOLAM HCL 2 MG/2ML IJ SOLN
INTRAMUSCULAR | Status: AC
  Filled 2018-05-04: qty 2

## 2018-05-04 MED ORDER — SODIUM CHLORIDE 0.9 % IV SOLN
INTRAVENOUS | Status: DC
  Administered 2018-05-04 – 2018-05-05 (×2): via INTRAVENOUS
  Filled 2018-05-04 (×2): qty 1000

## 2018-05-04 MED ORDER — SODIUM CHLORIDE 0.9 % IV SOLN
2.0000 g | INTRAVENOUS | Status: DC
  Administered 2018-05-05: 2 g via INTRAVENOUS
  Filled 2018-05-04: qty 20

## 2018-05-04 MED ORDER — DEXAMETHASONE SODIUM PHOSPHATE 10 MG/ML IJ SOLN
INTRAMUSCULAR | Status: DC | PRN
  Administered 2018-05-04: 10 mg via INTRAVENOUS

## 2018-05-04 MED ORDER — ONDANSETRON HCL 4 MG/2ML IJ SOLN
INTRAMUSCULAR | Status: AC
  Filled 2018-05-04: qty 2

## 2018-05-04 MED ORDER — LIDOCAINE 2% (20 MG/ML) 5 ML SYRINGE
INTRAMUSCULAR | Status: AC
  Filled 2018-05-04: qty 5

## 2018-05-04 MED ORDER — HYDROMORPHONE HCL 1 MG/ML IJ SOLN
0.5000 mg | INTRAMUSCULAR | Status: DC | PRN
  Filled 2018-05-04: qty 1

## 2018-05-04 MED ORDER — PHENAZOPYRIDINE HCL 200 MG PO TABS: 200.0000 mg | ORAL_TABLET | Freq: Three times a day (TID) | ORAL | 0 refills | Status: DC | PRN

## 2018-05-04 MED ORDER — CEFTRIAXONE SODIUM 1 G IJ SOLR
INTRAMUSCULAR | Status: AC
  Filled 2018-05-04: qty 10

## 2018-05-04 MED ORDER — BELLADONNA ALKALOIDS-OPIUM 16.2-30 MG RE SUPP
RECTAL | Status: AC
  Filled 2018-05-04: qty 1

## 2018-05-04 MED ORDER — PROPOFOL 10 MG/ML IV BOLUS
INTRAVENOUS | Status: AC
  Filled 2018-05-04: qty 20

## 2018-05-04 MED ORDER — PROPOFOL 10 MG/ML IV BOLUS
INTRAVENOUS | Status: DC | PRN
  Administered 2018-05-04: 40 mg via INTRAVENOUS
  Administered 2018-05-04: 160 mg via INTRAVENOUS

## 2018-05-04 MED ORDER — ONDANSETRON HCL 4 MG/2ML IJ SOLN
INTRAMUSCULAR | Status: DC | PRN
  Administered 2018-05-04: 4 mg via INTRAVENOUS

## 2018-05-04 MED ORDER — SODIUM CHLORIDE 0.9 % IV SOLN
1.0000 g | Freq: Once | INTRAVENOUS | Status: AC
  Administered 2018-05-04: 1 g via INTRAVENOUS
  Filled 2018-05-04: qty 10

## 2018-05-04 MED ORDER — LACTATED RINGERS IV SOLN
INTRAVENOUS | Status: DC
  Filled 2018-05-04: qty 1000

## 2018-05-04 MED ORDER — ACETAMINOPHEN 500 MG PO TABS
1000.0000 mg | ORAL_TABLET | Freq: Four times a day (QID) | ORAL | Status: DC
  Administered 2018-05-04 – 2018-05-05 (×2): 1000 mg via ORAL
  Filled 2018-05-04: qty 2

## 2018-05-04 MED ORDER — SODIUM CHLORIDE 0.9 % IR SOLN
Status: DC | PRN
  Administered 2018-05-04: 27000 mL via INTRAVESICAL

## 2018-05-04 MED ORDER — OXYCODONE HCL 5 MG PO TABS
5.0000 mg | ORAL_TABLET | Freq: Once | ORAL | Status: DC | PRN
  Filled 2018-05-04: qty 1

## 2018-05-04 MED ORDER — PROPOFOL 500 MG/50ML IV EMUL
INTRAVENOUS | Status: AC
  Filled 2018-05-04: qty 50

## 2018-05-04 MED ORDER — DEXAMETHASONE SODIUM PHOSPHATE 10 MG/ML IJ SOLN
INTRAMUSCULAR | Status: AC
  Filled 2018-05-04: qty 1

## 2018-05-04 MED ORDER — FENTANYL CITRATE (PF) 100 MCG/2ML IJ SOLN
INTRAMUSCULAR | Status: DC | PRN
  Administered 2018-05-04 (×4): 50 ug via INTRAVENOUS

## 2018-05-04 MED ORDER — SODIUM CHLORIDE 0.9 % IV SOLN
INTRAVENOUS | Status: AC
  Filled 2018-05-04: qty 100

## 2018-05-04 SURGICAL SUPPLY — 31 items
BAG DRAIN URO-CYSTO SKYTR STRL (DRAIN) ×2 IMPLANT
BAG DRN ANRFLXCHMBR STRAP LEK (BAG)
BAG DRN UROCATH (DRAIN) ×1
BAG URINE DRAINAGE (UROLOGICAL SUPPLIES) ×1 IMPLANT
BAG URINE LEG 19OZ MD ST LTX (BAG) IMPLANT
CATH FOLEY 3WAY 20FR (CATHETERS) IMPLANT
CATH FOLEY 3WAY 30CC 20FR (CATHETERS) IMPLANT
CATH FOLEY 3WAY 30CC 22F (CATHETERS) ×1 IMPLANT
CATH FOLEY 3WAY 30CC 24FR (CATHETERS)
CATH HEMA 3WAY 30CC 24FR COUDE (CATHETERS) IMPLANT
CATH HEMA 3WAY 30CC 24FR RND (CATHETERS) IMPLANT
CATH URTH STD 24FR FL 3W 2 (CATHETERS) IMPLANT
CLOTH BEACON ORANGE TIMEOUT ST (SAFETY) ×2 IMPLANT
ELECT REM PT RETURN 9FT ADLT (ELECTROSURGICAL)
ELECTRODE REM PT RTRN 9FT ADLT (ELECTROSURGICAL) IMPLANT
EVACUATOR MICROVAS BLADDER (UROLOGICAL SUPPLIES) ×2 IMPLANT
GLOVE BIO SURGEON STRL SZ8 (GLOVE) ×2 IMPLANT
GOWN STRL REUS W/TWL LRG LVL3 (GOWN DISPOSABLE) IMPLANT
GOWN STRL REUS W/TWL XL LVL3 (GOWN DISPOSABLE) ×2 IMPLANT
HOLDER FOLEY CATH W/STRAP (MISCELLANEOUS) ×1 IMPLANT
IV NS IRRIG 3000ML ARTHROMATIC (IV SOLUTION) ×9 IMPLANT
KIT TURNOVER CYSTO (KITS) ×2 IMPLANT
LOOP CUT BIPOLAR 24F LRG (ELECTROSURGICAL) ×1 IMPLANT
MANIFOLD NEPTUNE II (INSTRUMENTS) ×2 IMPLANT
PACK CYSTO (CUSTOM PROCEDURE TRAY) ×2 IMPLANT
PLUG CATH AND CAP STER (CATHETERS) IMPLANT
SYR 30ML LL (SYRINGE) IMPLANT
SYRINGE IRR TOOMEY STRL 70CC (SYRINGE) IMPLANT
TUBE CONNECTING 12X1/4 (SUCTIONS) ×1 IMPLANT
TUBING UROLOGY SET (TUBING) ×1 IMPLANT
WATER STERILE IRR 3000ML UROMA (IV SOLUTION) IMPLANT

## 2018-05-04 NOTE — Anesthesia Procedure Notes (Signed)
Procedure Name: LMA Insertion Date/Time: 05/04/2018 8:40 AM Performed by: Raenette Rover, CRNA Pre-anesthesia Checklist: Patient identified, Emergency Drugs available, Suction available and Patient being monitored Patient Re-evaluated:Patient Re-evaluated prior to induction Oxygen Delivery Method: Circle system utilized Preoxygenation: Pre-oxygenation with 100% oxygen Induction Type: IV induction LMA: LMA inserted LMA Size: 4.0 Number of attempts: 1 Placement Confirmation: positive ETCO2,  CO2 detector and breath sounds checked- equal and bilateral Tube secured with: Tape Dental Injury: Teeth and Oropharynx as per pre-operative assessment

## 2018-05-04 NOTE — Progress Notes (Addendum)
Pt. With continued bloody drainage from foley despite CBI irrigation at fastest rate. RN called to room pt. C/o fullness in bladder, CBI not infusing, questionable clot in foley. Dr. Karsten Ro paged and made aware. Verbal order received ok to hand irrigate foley and to deflate balloon, advance foley slightly and add additional 15 ml NS back to balloon and reapply traction. Verbal orders enacted. Will continue to closely monitor patient.  Anaka Beazer, Arville Lime

## 2018-05-04 NOTE — Anesthesia Preprocedure Evaluation (Addendum)
Anesthesia Evaluation  Patient identified by MRN, date of birth, ID band Patient awake    Reviewed: Allergy & Precautions, NPO status , Patient's Chart, lab work & pertinent test results  History of Anesthesia Complications Negative for: history of anesthetic complications  Airway Mallampati: I  TM Distance: >3 FB Neck ROM: Full    Dental no notable dental hx.    Pulmonary neg pulmonary ROS,    Pulmonary exam normal        Cardiovascular hypertension, Pt. on medications Normal cardiovascular exam     Neuro/Psych negative neurological ROS  negative psych ROS   GI/Hepatic negative GI ROS, Neg liver ROS,   Endo/Other  negative endocrine ROS  Renal/GU negative Renal ROS  negative genitourinary   Musculoskeletal negative musculoskeletal ROS (+)   Abdominal   Peds  Hematology negative hematology ROS (+)   Anesthesia Other Findings   Reproductive/Obstetrics                            Anesthesia Physical Anesthesia Plan  ASA: II  Anesthesia Plan: General   Post-op Pain Management:    Induction: Intravenous  PONV Risk Score and Plan: 3 and Ondansetron, Dexamethasone, Midazolam and Treatment may vary due to age or medical condition  Airway Management Planned: LMA  Additional Equipment: None  Intra-op Plan:   Post-operative Plan: Extubation in OR  Informed Consent: I have reviewed the patients History and Physical, chart, labs and discussed the procedure including the risks, benefits and alternatives for the proposed anesthesia with the patient or authorized representative who has indicated his/her understanding and acceptance.     Plan Discussed with:   Anesthesia Plan Comments:        Anesthesia Quick Evaluation

## 2018-05-04 NOTE — Op Note (Signed)
PATIENT:  Miguel Solis  PRE-OPERATIVE DIAGNOSIS: BPH with outlet obstruction  POST-OPERATIVE DIAGNOSIS: Same  PROCEDURE: TURP  SURGEON:  Claybon Jabs  INDICATION: [name] is a 67 year old male with BPH and outlet obstruction that has resulted in urinary retention.  Despite medical management and attempted voiding trial is been unsuccessful.  Due to multiple episodes of urinary retention with very high volumes he underwent urodynamics to assess detrusor contractility and he was found to generate significant pressure but was unable to urinate.  He therefore is felt to be an excellent candidate for transurethral resection of his prostate and we have discussed this procedure in detail.  He presents today for transurethral resection of his prostate.  He underwent a preoperative urine culture which was positive and he was placed on appropriate antibiotics for a week prior to the procedure and remains on them until today.  He also received 1 g of Rocephin preoperatively.  ANESTHESIA:  General  EBL: 100 cc  DRAINS: 5 French three-way Foley catheter with continuous bladder irrigation  SPECIMEN:  Prostate chips to pathology  After informed consent the patient was brought to the major OR and placed on the table. He was administered general anesthesia and then moved to the dorsal lithotomy position. His genitalia was sterilely prepped and draped and an official timeout was then performed.  Initially the 28 French resectoscope sheath with the visual obturator was passed into the bladder and the obturator removed. I then inserted the resectoscope element with 30 lens and  performed a systematic inspection of the bladder. I noted the bladder had 3+ trabeculation with cellule formation but was free of any tumor stones or inflammatory lesions. The ureteral orifices were noted to be of normal configuration and position. Withdrawing the scope into the prostatic urethra I noted obstructing trilobar  hypertrophy with elongation of the prostatic urethra and a large median lobe component.  Resection was then begun I. first resected the median lobe in the midline down to the level of the bladder neck. I then began resecting the left lobe of the prostate by resecting first from the level of the bladder neck back to the level of the Veru at the 5:00 position and then progressed in a counterclockwise direction resecting all of the adenomatous tissue of the left lobe down to the surgical capsule. Bleeding points were cauterized as they were encountered. I then turned my attention to the right lobe of the prostate and it was resected in an identical fashion. Tissue in the area of the apex was then resected circumferentially with care being taken to maintain the resection proximal to the Veru at all times. The prostatic chips were then flushed into the bladder and the Microvasive evacuator was then used to evacuate all chips from the bladder. Reinspection of the bladder revealed the mucosa to be intact, the ureteral orifices intact as well and well away from the bladder neck and area of resection. There were no prostatic chips remaining within the bladder. The prostatic capsule was intact throughout with no perforation and there was no active bleeding noted at the end of the procedure.  The resectoscope was therefore removed and the 22 French three-way Foley catheter was then inserted and balloon was filled to 30 cc. This was placed on mild traction and the bladder was irrigated with the irrigant returning clear. The catheter was then hooked to closed system drainage and continuous irrigation and the patient was awakened and taken to recovery room in stable and  satisfactory condition. He tolerated the procedure well and there were no intraoperative complications.  PLAN OF CARE: Observation overnight with anticipated discharge in the morning.  PATIENT DISPOSITION:  PACU - Hemodynamically stable.

## 2018-05-04 NOTE — Transfer of Care (Signed)
Immediate Anesthesia Transfer of Care Note  Patient: Miguel Solis  Procedure(s) Performed: TRANSURETHRAL RESECTION OF THE PROSTATE (TURP) (N/A Prostate)  Patient Location: PACU  Anesthesia Type:General  Level of Consciousness: awake, alert , oriented and patient cooperative  Airway & Oxygen Therapy: Patient Spontanous Breathing and Patient connected to face mask oxygen  Post-op Assessment: Report given to RN and Post -op Vital signs reviewed and stable  Post vital signs: Reviewed and stable  Last Vitals:  Vitals Value Taken Time  BP 114/77 05/04/2018 10:01 AM  Temp    Pulse 89 05/04/2018 10:02 AM  Resp 15 05/04/2018 10:02 AM  SpO2 96 % 05/04/2018 10:02 AM  Vitals shown include unvalidated device data.  Last Pain:  Vitals:   05/04/18 0659  TempSrc: Oral      Patients Stated Pain Goal: 4 (49/67/59 1638)  Complications: No apparent anesthesia complications

## 2018-05-04 NOTE — Anesthesia Postprocedure Evaluation (Signed)
Anesthesia Post Note  Patient: Miguel Solis  Procedure(s) Performed: TRANSURETHRAL RESECTION OF THE PROSTATE (TURP) (N/A Prostate)     Patient location during evaluation: PACU Anesthesia Type: General Level of consciousness: awake and alert Pain management: pain level controlled Vital Signs Assessment: post-procedure vital signs reviewed and stable Respiratory status: spontaneous breathing, nonlabored ventilation and respiratory function stable Cardiovascular status: blood pressure returned to baseline and stable Postop Assessment: no apparent nausea or vomiting Anesthetic complications: no    Last Vitals:  Vitals:   05/04/18 1030 05/04/18 1045  BP: 113/71 127/77  Pulse: 89 86  Resp: 16 15  Temp:    SpO2: 94% 92%    Last Pain:  Vitals:   05/04/18 1045  TempSrc:   PainSc: 1                  Lidia Collum

## 2018-05-05 ENCOUNTER — Other Ambulatory Visit: Payer: Self-pay

## 2018-05-05 DIAGNOSIS — I10 Essential (primary) hypertension: Secondary | ICD-10-CM | POA: Diagnosis not present

## 2018-05-05 DIAGNOSIS — N401 Enlarged prostate with lower urinary tract symptoms: Secondary | ICD-10-CM | POA: Diagnosis not present

## 2018-05-05 DIAGNOSIS — N138 Other obstructive and reflux uropathy: Secondary | ICD-10-CM | POA: Diagnosis not present

## 2018-05-05 DIAGNOSIS — R338 Other retention of urine: Secondary | ICD-10-CM | POA: Diagnosis not present

## 2018-05-05 DIAGNOSIS — E7800 Pure hypercholesterolemia, unspecified: Secondary | ICD-10-CM | POA: Diagnosis not present

## 2018-05-05 DIAGNOSIS — Z79899 Other long term (current) drug therapy: Secondary | ICD-10-CM | POA: Diagnosis not present

## 2018-05-05 MED ORDER — ATORVASTATIN CALCIUM 40 MG PO TABS
40.0000 mg | ORAL_TABLET | Freq: Every day | ORAL | Status: DC
  Administered 2018-05-05: 40 mg via ORAL
  Filled 2018-05-05: qty 1

## 2018-05-05 MED ORDER — SODIUM CHLORIDE 0.9 % IV SOLN
INTRAVENOUS | Status: AC
  Filled 2018-05-05: qty 100

## 2018-05-05 MED ORDER — ACETAMINOPHEN 500 MG PO TABS
ORAL_TABLET | ORAL | Status: AC
  Filled 2018-05-05: qty 2

## 2018-05-05 MED ORDER — TAMSULOSIN HCL 0.4 MG PO CAPS
0.4000 mg | ORAL_CAPSULE | Freq: Every day | ORAL | Status: DC
  Administered 2018-05-05: 0.4 mg via ORAL
  Filled 2018-05-05: qty 1

## 2018-05-05 MED ORDER — CEFTRIAXONE SODIUM 2 G IJ SOLR
INTRAMUSCULAR | Status: AC
  Filled 2018-05-05: qty 20

## 2018-05-05 MED ORDER — CEPHALEXIN 500 MG PO CAPS
500.0000 mg | ORAL_CAPSULE | Freq: Three times a day (TID) | ORAL | Status: DC
  Administered 2018-05-05 – 2018-05-06 (×3): 500 mg via ORAL
  Filled 2018-05-05 (×3): qty 1

## 2018-05-05 NOTE — Progress Notes (Signed)
Pt with continued bloody urine. Held CBI for one hour and blood clots noted. Hand irrigated with moderate amount of small clots noted. CBI restarted. Dr. Junious Silk made aware, will leave Foley in for now and reevaluate, may require hospital admission until urine clears. Pt has maintained a moderate rat for CBI.

## 2018-05-05 NOTE — Progress Notes (Signed)
Dr. Junious Silk in to see pt, irrigated foley moderate amount of clots noted, CBI restarted and moderate to heavy rate, will plan to admit tonight for observation. Report called to [employer]

## 2018-05-05 NOTE — Progress Notes (Signed)
1 Day Post-Op Subjective: Patient reports No complaints.  Feeling well. Nurses DC'd CBI this morning but he developed red urine and clots.  Objective: Vital signs in last 24 hours: Temp:  [97.1 F (36.2 C)-98.3 F (36.8 C)] 98 F (36.7 C) (11/02 0557) Pulse Rate:  [74-91] 74 (11/02 0557) Resp:  [14-18] 16 (11/02 0557) BP: (109-134)/(69-87) 124/72 (11/02 0557) SpO2:  [92 %-99 %] 95 % (11/02 0557)  Intake/Output from previous day: 11/01 0701 - 11/02 0700 In: 56812 [P.O.:1164; I.V.:1450] Out: 27580 [XNTZG:01749; Blood:50] Intake/Output this shift: Total I/O In: 2940 [P.O.:240; Other:2700] Out: 2700 [Urine:2700]  Physical Exam:  No acute distress Alert and oriented No focal neurologic deficits Abdomen soft and nontender GU urine dark maroon Ext no calf swelling or pain  Procedure: Foley irrigated a few small clots with continued mild hematuria but the drainage did redden up again without CBI.  CBI restarted at a moderate drip and urine light pink.  Lab Results: Recent Labs    05/04/18 0749  HGB 18.7*  HCT 55.0*   BMET Recent Labs    05/04/18 0749  NA 139  K 3.7  GLUCOSE 148*   No results for input(s): LABPT, INR in the last 72 hours. No results for input(s): LABURIN in the last 72 hours. Results for orders placed or performed during the hospital encounter of 04/13/18  Urine Culture     Status: Abnormal   Collection Time: 04/13/18  7:14 PM  Result Value Ref Range Status   Specimen Description   Final    URINE, CLEAN CATCH Performed at [facility], [address] [place], [place] [address]    Special Requests   Final    NONE Performed at [facility], [address] [place], [place] [address]    Culture >=100,000 COLONIES/mL PROTEUS MIRABILIS (A)  Final   Report Status 04/16/2018 FINAL  Final   Organism ID, Bacteria PROTEUS MIRABILIS (A)  Final      Susceptibility   Proteus mirabilis - MIC*    AMPICILLIN 4 SENSITIVE Sensitive     CEFAZOLIN <=4 SENSITIVE  Sensitive     CEFTRIAXONE <=1 SENSITIVE Sensitive     CIPROFLOXACIN >=4 RESISTANT Resistant     GENTAMICIN 8 INTERMEDIATE Intermediate     IMIPENEM <=0.25 SENSITIVE Sensitive     NITROFURANTOIN 256 RESISTANT Resistant     TRIMETH/SULFA <=20 SENSITIVE Sensitive     AMPICILLIN/SULBACTAM <=2 SENSITIVE Sensitive     PIP/TAZO <=4 SENSITIVE Sensitive     * >=100,000 COLONIES/mL PROTEUS MIRABILIS    Studies/Results: No results found.  Assessment/Plan: Postoperative day 1 - transurethral resection of prostate-given need to restart CBI this morning we'll continue CBI and try again tomorrow morning for void trial. He has failed several voiding trials prior to TURP and another night might increase his success of voiding tomorrow as well.   LOS: 0 days   Festus Aloe 05/05/2018, 9:03 AM

## 2018-05-06 DIAGNOSIS — E7800 Pure hypercholesterolemia, unspecified: Secondary | ICD-10-CM | POA: Diagnosis not present

## 2018-05-06 DIAGNOSIS — R338 Other retention of urine: Secondary | ICD-10-CM | POA: Diagnosis not present

## 2018-05-06 DIAGNOSIS — N401 Enlarged prostate with lower urinary tract symptoms: Secondary | ICD-10-CM | POA: Diagnosis not present

## 2018-05-06 DIAGNOSIS — I10 Essential (primary) hypertension: Secondary | ICD-10-CM | POA: Diagnosis not present

## 2018-05-06 DIAGNOSIS — Z79899 Other long term (current) drug therapy: Secondary | ICD-10-CM | POA: Diagnosis not present

## 2018-05-06 DIAGNOSIS — N138 Other obstructive and reflux uropathy: Secondary | ICD-10-CM | POA: Diagnosis not present

## 2018-05-06 NOTE — Discharge Summary (Signed)
Physician Discharge Summary  Patient ID: Miguel Solis MRN: [id number] DOB/AGE: 68-May-1952 68 y.o.  Admit date: 05/04/2018 Discharge date: 05/06/2018  Admission Diagnoses: BPH, urinary retention  Discharge Diagnoses:  Active Problems:   BPH with urinary obstruction   BPH with obstruction/lower urinary tract symptoms   Discharged Condition: good  Hospital Course: Patient was admitted following transurethral resection of prostate.  CBI was discontinued on postoperative day 1 but he continued to have hematuria and clots.  By postop day 2 hematuria has cleared.  His bladder was filled and the Foley catheter removed (see below). He voided several times with a good flow and no clots. He was ready for discharge.   Consults: None  Significant Diagnostic Studies: None  Treatments: surgery: Transurethral resection of prostate  Discharge Exam: Blood pressure 135/74, pulse 67, temperature 98.3 F (36.8 C), temperature source Oral, resp. rate 16, height 5\' 10"  (1.778 m), weight 82.6 kg, SpO2 98 %. No acute distress Watching TV No focal neurologic deficits Abdomen soft and nontender Foley catheter in place with light red urine No calf pain or swelling  Procedure: I irrigated the patient's catheter with normal saline and there was a small clot but it irrigated to light pink quickly and there was no further bleeding.  His bladder was filled to about 200 mL in the Foley catheter removed.  Disposition:    Allergies as of 05/06/2018   No Known Allergies     Medication List    STOP taking these medications   cephALEXin 500 MG capsule Commonly known as:  KEFLEX   tamsulosin 0.4 MG Caps capsule Commonly known as:  FLOMAX     TAKE these medications   amoxicillin-clavulanate 500-125 MG tablet Commonly known as:  AUGMENTIN Take 1 tablet by mouth every 12 (twelve) hours.   atorvastatin 40 MG tablet Commonly known as:  LIPITOR Take 1 tablet (40 mg total) by mouth daily.   colchicine  0.6 MG tablet Take 1 tablet (0.6 mg total) by mouth 2 (two) times daily as needed.   lisinopril 10 MG tablet Commonly known as:  PRINIVIL,ZESTRIL Take 1 tablet (10 mg total) by mouth daily.   phenazopyridine 200 MG tablet Commonly known as:  PYRIDIUM Take 1 tablet (200 mg total) by mouth 3 (three) times daily as needed for pain.   traMADol 50 MG tablet Commonly known as:  ULTRAM Take 1 tablet (50 mg total) by mouth every 6 (six) hours as needed.      Follow-up Information    [facility] On 05/18/2018.   Why:  For your appiontment at 8:30 Contact information: [place]          Signed: Festus Aloe 05/06/2018, 10:09 AM

## 2018-05-06 NOTE — Progress Notes (Signed)
Patient had to be hand irrigated once at the beginning of the shift last night. After irrigating, CBI rate was increased.  Foley was emptied about every 30 minutes-1 hour throughout the night.  Urine still appears pink in color this am. There are no noticeable clots, but CBI was only recently clamped.  We will monitor for a few hours w/ CBI fluids clamped, to make sure catheter does not occlude a second time.  Patient feels more comfortable to have MD assess catheter this am prior to pulling foley.  He does not want to have the catheter pulled prematurely, and another one placed.Miguel Solis

## 2018-05-06 NOTE — Plan of Care (Signed)
Discharge instructions reviewed with patient and wife, questions answered, verbalized understanding.  Patient transported to main entrance via wheelchair to be taken home by spouse.  Hardy copy Prescriptions for Pyridium and Tramadol given to patient.

## 2018-05-07 ENCOUNTER — Encounter (HOSPITAL_BASED_OUTPATIENT_CLINIC_OR_DEPARTMENT_OTHER): Payer: Self-pay | Admitting: Urology

## 2018-05-18 DIAGNOSIS — R338 Other retention of urine: Secondary | ICD-10-CM | POA: Diagnosis not present

## 2018-08-07 ENCOUNTER — Encounter (HOSPITAL_COMMUNITY): Payer: Self-pay | Admitting: Emergency Medicine

## 2018-08-07 ENCOUNTER — Inpatient Hospital Stay (HOSPITAL_COMMUNITY)
Admission: EM | Admit: 2018-08-07 | Discharge: 2018-08-10 | DRG: 896 | Disposition: A | Discharge status: Home Health | Payer: Medicare Other | Attending: Internal Medicine | Admitting: Internal Medicine

## 2018-08-07 ENCOUNTER — Other Ambulatory Visit: Payer: Self-pay

## 2018-08-07 ENCOUNTER — Emergency Department (HOSPITAL_COMMUNITY): Payer: Medicare Other

## 2018-08-07 DIAGNOSIS — I10 Essential (primary) hypertension: Secondary | ICD-10-CM | POA: Diagnosis present

## 2018-08-07 DIAGNOSIS — F10231 Alcohol dependence with withdrawal delirium: Secondary | ICD-10-CM | POA: Diagnosis present

## 2018-08-07 DIAGNOSIS — M109 Gout, unspecified: Secondary | ICD-10-CM | POA: Diagnosis present

## 2018-08-07 DIAGNOSIS — R9431 Abnormal electrocardiogram [ECG] [EKG]: Secondary | ICD-10-CM | POA: Diagnosis present

## 2018-08-07 DIAGNOSIS — F10251 Alcohol dependence with alcohol-induced psychotic disorder with hallucinations: Secondary | ICD-10-CM | POA: Diagnosis not present

## 2018-08-07 DIAGNOSIS — G8191 Hemiplegia, unspecified affecting right dominant side: Secondary | ICD-10-CM

## 2018-08-07 DIAGNOSIS — D751 Secondary polycythemia: Secondary | ICD-10-CM | POA: Diagnosis present

## 2018-08-07 DIAGNOSIS — R200 Anesthesia of skin: Secondary | ICD-10-CM | POA: Diagnosis not present

## 2018-08-07 DIAGNOSIS — Z8673 Personal history of transient ischemic attack (TIA), and cerebral infarction without residual deficits: Secondary | ICD-10-CM | POA: Diagnosis present

## 2018-08-07 DIAGNOSIS — I639 Cerebral infarction, unspecified: Secondary | ICD-10-CM | POA: Diagnosis not present

## 2018-08-07 DIAGNOSIS — N401 Enlarged prostate with lower urinary tract symptoms: Secondary | ICD-10-CM | POA: Diagnosis present

## 2018-08-07 DIAGNOSIS — N138 Other obstructive and reflux uropathy: Secondary | ICD-10-CM | POA: Diagnosis present

## 2018-08-07 DIAGNOSIS — E785 Hyperlipidemia, unspecified: Secondary | ICD-10-CM | POA: Diagnosis present

## 2018-08-07 DIAGNOSIS — E860 Dehydration: Secondary | ICD-10-CM | POA: Diagnosis present

## 2018-08-07 DIAGNOSIS — I6523 Occlusion and stenosis of bilateral carotid arteries: Secondary | ICD-10-CM | POA: Diagnosis not present

## 2018-08-07 DIAGNOSIS — R4182 Altered mental status, unspecified: Secondary | ICD-10-CM | POA: Diagnosis not present

## 2018-08-07 DIAGNOSIS — F10232 Alcohol dependence with withdrawal with perceptual disturbance: Secondary | ICD-10-CM | POA: Diagnosis not present

## 2018-08-07 DIAGNOSIS — R739 Hyperglycemia, unspecified: Secondary | ICD-10-CM | POA: Diagnosis present

## 2018-08-07 DIAGNOSIS — R441 Visual hallucinations: Secondary | ICD-10-CM | POA: Diagnosis not present

## 2018-08-07 DIAGNOSIS — F10932 Alcohol use, unspecified with withdrawal with perceptual disturbance: Secondary | ICD-10-CM

## 2018-08-07 LAB — COMPREHENSIVE METABOLIC PANEL
ALT: 42 U/L (ref 0–44)
AST: 70 U/L — AB (ref 15–41)
Albumin: 3.9 g/dL (ref 3.5–5.0)
Alkaline Phosphatase: 109 U/L (ref 38–126)
Anion gap: 14 (ref 5–15)
BUN: 7 mg/dL — ABNORMAL LOW (ref 8–23)
CO2: 21 mmol/L — ABNORMAL LOW (ref 22–32)
Calcium: 9.2 mg/dL (ref 8.9–10.3)
Chloride: 103 mmol/L (ref 98–111)
Creatinine, Ser: 0.8 mg/dL (ref 0.61–1.24)
GFR calc Af Amer: >60 mL/min (ref >60)
GFR calc non Af Amer: >60 mL/min (ref >60)
Glucose, Bld: 169 mg/dL — ABNORMAL HIGH (ref 70–99)
Potassium: 3.6 mmol/L (ref 3.5–5.1)
Sodium: 138 mmol/L (ref 135–145)
TOTAL PROTEIN: 8.7 g/dL — AB (ref 6.5–8.1)
Total Bilirubin: 1.1 mg/dL (ref 0.3–1.2)

## 2018-08-07 LAB — LIPASE, BLOOD: Lipase: 45 U/L (ref 11–51)

## 2018-08-07 LAB — CBC
HCT: 56.8 % — ABNORMAL HIGH (ref 39.0–52.0)
Hemoglobin: 19.1 g/dL — ABNORMAL HIGH (ref 13.0–17.0)
MCH: 35.5 pg — AB (ref 26.0–34.0)
MCHC: 33.6 g/dL (ref 30.0–36.0)
MCV: 105.6 fL — ABNORMAL HIGH (ref 80.0–100.0)
PLATELETS: 301 10*3/uL (ref 150–400)
RBC: 5.38 MIL/uL (ref 4.22–5.81)
RDW: 12.8 % (ref 11.5–15.5)
WBC: 10.8 10*3/uL — ABNORMAL HIGH (ref 4.0–10.5)
nRBC: 0 % (ref 0.0–0.2)

## 2018-08-07 LAB — PROTIME-INR
INR: 1.07
Prothrombin Time: 13.8 seconds (ref 11.4–15.2)

## 2018-08-07 LAB — DIFFERENTIAL
ABS IMMATURE GRANULOCYTES: 0.03 10*3/uL (ref 0.00–0.07)
Basophils Absolute: 0.2 10*3/uL — ABNORMAL HIGH (ref 0.0–0.1)
Basophils Relative: 1 %
Eosinophils Absolute: 0.4 10*3/uL (ref 0.0–0.5)
Eosinophils Relative: 3 %
IMMATURE GRANULOCYTES: 0 %
LYMPHS ABS: 2.6 10*3/uL (ref 0.7–4.0)
Lymphocytes Relative: 24 %
MONOS PCT: 7 %
Monocytes Absolute: 0.8 10*3/uL (ref 0.1–1.0)
Neutro Abs: 6.9 10*3/uL (ref 1.7–7.7)
Neutrophils Relative %: 65 %

## 2018-08-07 LAB — I-STAT TROPONIN, ED: Troponin i, poc: 0 ng/mL (ref 0.00–0.08)

## 2018-08-07 LAB — TROPONIN I: Troponin I: <0.03 ng/mL (ref <0.03)

## 2018-08-07 LAB — ETHANOL: Alcohol, Ethyl (B): <10 mg/dL (ref <10)

## 2018-08-07 LAB — MAGNESIUM: MAGNESIUM: 1.4 mg/dL — AB (ref 1.7–2.4)

## 2018-08-07 LAB — AMMONIA: AMMONIA: 30 umol/L (ref 9–35)

## 2018-08-07 LAB — PHOSPHORUS: PHOSPHORUS: 2.6 mg/dL (ref 2.5–4.6)

## 2018-08-07 LAB — CBG MONITORING, ED: Glucose-Capillary: 183 mg/dL — ABNORMAL HIGH (ref 70–99)

## 2018-08-07 LAB — APTT: aPTT: 31 seconds (ref 24–36)

## 2018-08-07 MED ORDER — LORAZEPAM 2 MG/ML IJ SOLN
0.0000 mg | Freq: Four times a day (QID) | INTRAMUSCULAR | Status: DC
  Filled 2018-08-07: qty 1

## 2018-08-07 MED ORDER — LORAZEPAM 2 MG/ML IJ SOLN
1.0000 mg | Freq: Once | INTRAMUSCULAR | Status: AC
  Administered 2018-08-07: 1 mg via INTRAVENOUS

## 2018-08-07 MED ORDER — FOLIC ACID 1 MG PO TABS
1.0000 mg | ORAL_TABLET | Freq: Every day | ORAL | Status: DC
  Administered 2018-08-08 – 2018-08-10 (×3): 1 mg via ORAL
  Filled 2018-08-07 (×3): qty 1

## 2018-08-07 MED ORDER — LORAZEPAM 2 MG/ML IJ SOLN: 0.0000 mg | Freq: Two times a day (BID) | INTRAMUSCULAR | Status: DC

## 2018-08-07 MED ORDER — THIAMINE HCL 100 MG/ML IJ SOLN
100.0000 mg | Freq: Every day | INTRAMUSCULAR | Status: DC
  Filled 2018-08-07: qty 2

## 2018-08-07 MED ORDER — POTASSIUM CHLORIDE IN NACL 20-0.9 MEQ/L-% IV SOLN
INTRAVENOUS | Status: AC
  Administered 2018-08-07: 22:00:00 via INTRAVENOUS
  Filled 2018-08-07: qty 1000

## 2018-08-07 MED ORDER — LORAZEPAM 1 MG PO TABS: 1.0000 mg | ORAL_TABLET | Freq: Four times a day (QID) | ORAL | Status: DC | PRN

## 2018-08-07 MED ORDER — SODIUM CHLORIDE 0.9 % IV BOLUS
1000.0000 mL | Freq: Once | INTRAVENOUS | Status: AC
  Administered 2018-08-07: 1000 mL via INTRAVENOUS

## 2018-08-07 MED ORDER — ONDANSETRON HCL 4 MG/2ML IJ SOLN: 4.0000 mg | Freq: Four times a day (QID) | INTRAMUSCULAR | Status: DC | PRN

## 2018-08-07 MED ORDER — LORAZEPAM 2 MG/ML IJ SOLN: 1.0000 mg | Freq: Four times a day (QID) | INTRAMUSCULAR | Status: DC | PRN

## 2018-08-07 MED ORDER — LORAZEPAM 2 MG/ML IJ SOLN
2.0000 mg | INTRAMUSCULAR | Status: DC | PRN
  Administered 2018-08-07: 2 mg via INTRAVENOUS
  Filled 2018-08-07: qty 1

## 2018-08-07 MED ORDER — SODIUM CHLORIDE 0.9 % IV SOLN
INTRAVENOUS | Status: DC
  Administered 2018-08-07 – 2018-08-08 (×2): via INTRAVENOUS

## 2018-08-07 MED ORDER — METOPROLOL TARTRATE 5 MG/5ML IV SOLN
5.0000 mg | Freq: Once | INTRAVENOUS | Status: AC
  Administered 2018-08-07: 5 mg via INTRAVENOUS
  Filled 2018-08-07: qty 5

## 2018-08-07 MED ORDER — ADULT MULTIVITAMIN W/MINERALS CH
1.0000 | ORAL_TABLET | Freq: Every day | ORAL | Status: DC
  Administered 2018-08-08 – 2018-08-10 (×3): 1 via ORAL
  Filled 2018-08-07 (×3): qty 1

## 2018-08-07 MED ORDER — VITAMIN B-1 100 MG PO TABS
100.0000 mg | ORAL_TABLET | Freq: Every day | ORAL | Status: DC
  Administered 2018-08-08 – 2018-08-10 (×3): 100 mg via ORAL
  Filled 2018-08-07 (×3): qty 1

## 2018-08-07 MED ORDER — ONDANSETRON HCL 4 MG PO TABS: 4.0000 mg | ORAL_TABLET | Freq: Four times a day (QID) | ORAL | Status: DC | PRN

## 2018-08-07 MED ORDER — MAGNESIUM SULFATE 2 GM/50ML IV SOLN
2.0000 g | Freq: Once | INTRAVENOUS | Status: AC
  Administered 2018-08-07: 2 g via INTRAVENOUS
  Filled 2018-08-07: qty 50

## 2018-08-07 MED ORDER — LORAZEPAM 2 MG/ML IJ SOLN
1.0000 mg | Freq: Once | INTRAMUSCULAR | Status: AC
  Administered 2018-08-07: 1 mg via INTRAVENOUS
  Filled 2018-08-07: qty 1

## 2018-08-07 NOTE — ED Triage Notes (Signed)
Pt c/o of intermittent numbness to rt hand since 0600. Stroke scale is negative.  Family reports pt is a heavy drinker and falls a lot.  Pt reports he fell at 0600 today.

## 2018-08-07 NOTE — H&P (Addendum)
History and Physical    Elwin Tsou Marion JGG:[id number] DOB: 1950-08-08 DOA: 08/07/2018  PCP: [name], MD   Patient coming from: Home.  I have personally briefly reviewed patient's old medical records in [place]  Chief Complaint: Right hand numbness.  HPI: Miguel Solis is a 68 y.o. male with medical history significant of colon polyps, gout, hyperlipidemia, hypertension, glucose intolerance, alcohol dependence who is coming to the emergency department with complaints of intermittent right hand numbness since 6 in the morning, but also noticed to have tremors, hallucinosis (states he is seeing things like bugs), tremors and is stating that he has inability to control his body from shaking.  Family member stated that he is heavy drinker in the emergency department.  When seen in the ED, the patient does open his eyes briefly.  However, once I saw him in the ICU, he woke up and although somnolent, was able to answer some questions.  He stated he has not had anything to drink in the past 2 days.  He normally drinks about a 12 pack of beers daily.  That was similar to the amount that was provided by family members to the ICU nursing staff.  He denies having any pain or any other symptoms that he could remember.  However, the patient was still mildly sedated with lorazepam.  ED Course: Initial vital signs temperature 98.7 F, pulse 113, respirations 18, blood pressure 183/103 mmHg and O2 sat 98% on room air.  The patient was given a 1000 mL NS bolus and at least 2 mg of lorazepam IVP.  CBC shows a white count of 10.8, hemoglobin 19.1 g/dL and platelets 301.  PT/INR/PTT within normal limits.  Ammonia, alcohol level and phosphorus were normal.  Magnesium was 1.4 mg/dL.  Troponin was normal.  CMP shows a CO2 of 21 mmol/L.  BUN 7, glucose 169 mg/dL.  Total protein was 8.7 g/dL and AST 70 units/L.  All other values are within normal limits.  Lipase was normal at 45 units/L.  Imaging: head CT  did not show any acute intracranial pathology.  However showed a stable mild atrophy and minimal chronic small vessel white matter ischemic changes in both cerebral hemisphere.  Mild chronic sinusitis was seen.  Review of Systems: Unable to fully obtain.   Past Medical History:  Diagnosis Date  . Colon polyp 2008  . Foley catheter in place   . Gout   . Hyperlipemia   . Hyperlipidemia   . Hypertension   . Impaired fasting glucose   . Urinary retention     Past Surgical History:  Procedure Laterality Date  . COLONOSCOPY N/A 08/18/2017   Procedure: COLONOSCOPY;  Surgeon: Daneil Dolin, MD;  [facility];  Service: Endoscopy;  Laterality: N/A;  10:30  . KNEE SURGERY Left yrs ago   torn meniscus  . POLYPECTOMY  08/18/2017   Procedure: POLYPECTOMY;  Surgeon: Daneil Dolin, MD;  Location[facility];  Service: Endoscopy;;  cecal x2 (cold snare)and ascending colon x2 (hot snare)  . TRANSURETHRAL RESECTION OF PROSTATE N/A 05/04/2018   Procedure: TRANSURETHRAL RESECTION OF THE PROSTATE (TURP);  Surgeon: Kathie Rhodes, MD;  Location: [facility];  Service: Urology;  Laterality: N/A;     reports that he has never smoked. He has never used smokeless tobacco. He reports current alcohol use. He reports that he does not use drugs.  No Known Allergies  Family History  Problem Relation Age of Onset  .  Cancer Mother    Prior to Admission medications   Medication Sig Start Date End Date Taking? Authorizing Provider  atorvastatin (LIPITOR) 40 MG tablet Take 1 tablet (40 mg total) by mouth daily. Patient not taking: Reported on 08/07/2018 10/16/17   [name], MD  lisinopril (PRINIVIL,ZESTRIL) 10 MG tablet Take 1 tablet (10 mg total) by mouth daily. Patient not taking: Reported on 05/05/2018 10/16/17 06/16/21  [name], MD    Physical Exam: Vitals:   08/07/18 1915 08/07/18 1945 08/07/18 2015 08/07/18 2045  BP: (!) 160/92 (!) 161/97 (!) 161/99 (!)  165/103  Pulse: (!) 102 (!) 103 (!) 101 (!) 104  Resp: 15 19 20 18   Temp:      TempSrc:      SpO2: 98% 96% 97% 98%  Weight:      Height:        Constitutional: NAD, calm, comfortable Eyes: PERRL, lids and conjunctivae mildly injected. ENMT: No tongue fasciculations.  Mucous membranes are mildly dry. Posterior pharynx clear of any exudate or lesions. Neck: normal, supple, no masses, no thyromegaly Respiratory: clear to auscultation bilaterally, no wheezing, no crackles. Normal respiratory effort. No accessory muscle use.  Cardiovascular: Regular rate and rhythm, no murmurs / rubs / gallops. No extremity edema. 2+ pedal pulses. No carotid bruits.  Abdomen: Soft, no tenderness, no masses palpated. No hepatosplenomegaly. Bowel sounds positive.  Musculoskeletal: no clubbing / cyanosis. No joint deformity upper and lower extremities. Good ROM, no contractures. Normal muscle tone.  Skin: Positive ecchymosis on right forearm.  Small 1 x 1 cm posterior thigh healing wound.  Please see below pictures for further detail. Neurologic: No tremors at this time.  CN 2-12 grossly intact. Sensation intact, DTR normal. Strength 5/5 in all 4.  Psychiatric: Normal judgment and insight. Alert and oriented x 3. Normal mood.       Labs on Admission: I have personally reviewed following labs and imaging studies  CBC: Recent Labs  Lab 08/07/18 1530  WBC 10.8*  NEUTROABS 6.9  HGB 19.1*  HCT 56.8*  MCV 105.6*  PLT 937   Basic Metabolic Panel: Recent Labs  Lab 08/07/18 1530 08/07/18 1710  NA 138  --   K 3.6  --   CL 103  --   CO2 21*  --   GLUCOSE 169*  --   BUN 7*  --   CREATININE 0.80  --   CALCIUM 9.2  --   MG  --  1.4*  PHOS  --  2.6   GFR: Estimated Creatinine Clearance: 76 mL/min (by C-G formula based on SCr of 0.8 mg/dL). Liver Function Tests: Recent Labs  Lab 08/07/18 1530  AST 70*  ALT 42  ALKPHOS 109  BILITOT 1.1  PROT 8.7*  ALBUMIN 3.9   Recent Labs  Lab  08/07/18 1643  LIPASE 45   Recent Labs  Lab 08/07/18 1643  AMMONIA 30   Coagulation Profile: Recent Labs  Lab 08/07/18 1643  INR 1.07   Cardiac Enzymes: Recent Labs  Lab 08/07/18 1530  TROPONINI <0.03   BNP (last 3 results) No results for input(s): PROBNP in the last 8760 hours. HbA1C: No results for input(s): HGBA1C in the last 72 hours. CBG: Recent Labs  Lab 08/07/18 1614  GLUCAP 183*   Lipid Profile: No results for input(s): CHOL, HDL, LDLCALC, TRIG, CHOLHDL, LDLDIRECT in the last 72 hours. Thyroid Function Tests: No results for input(s): TSH, T4TOTAL, FREET4, T3FREE, THYROIDAB in the last 72 hours. Anemia  Panel: No results for input(s): VITAMINB12, FOLATE, FERRITIN, TIBC, IRON, RETICCTPCT in the last 72 hours. Urine analysis:    Component Value Date/Time   COLORURINE AMBER (A) 04/13/2018 1709   APPEARANCEUR TURBID (A) 04/13/2018 1709   LABSPEC 1.012 04/13/2018 1709   PHURINE 8.0 04/13/2018 1709   GLUCOSEU NEGATIVE 04/13/2018 1709   HGBUR SMALL (A) 04/13/2018 1709   BILIRUBINUR NEGATIVE 04/13/2018 1709   KETONESUR NEGATIVE 04/13/2018 1709   PROTEINUR 100 (A) 04/13/2018 1709   NITRITE POSITIVE (A) 04/13/2018 1709   LEUKOCYTESUR LARGE (A) 04/13/2018 1709    Radiological Exams on Admission: Ct Head Wo Contrast  Result Date: 08/07/2018 CLINICAL DATA:  Right hand numbness since 6 a.m. today. Multiple falls due to heavy drinking. EXAM: CT HEAD WITHOUT CONTRAST TECHNIQUE: Contiguous axial images were obtained from the base of the skull through the vertex without intravenous contrast. COMPARISON:  Head CT dated 03/06/2018. FINDINGS: Brain: Stable mildly enlarged ventricles and cortical sulci. Minimal patchy white matter low density in both cerebral hemispheres. No intracranial hemorrhage, mass lesion or CT evidence of acute infarction. Vascular: No hyperdense vessel or unexpected calcification. Skull: Normal. Negative for fracture or focal lesion. Sinuses/Orbits:  Mild bilateral sphenoid, bilateral ethmoid and right maxillary sinus mucosal thickening. Unremarkable orbits. Other: None. IMPRESSION: 1. No acute abnormality. 2. Stable mild atrophy and minimal chronic small vessel white matter ischemic changes in both cerebral hemispheres. 3. Mild chronic sinusitis. Electronically Signed   By: Claudie Revering M.D.   On: 08/07/2018 17:57    EKG: Independently reviewed. Vent. rate 99 BPM PR interval 146 ms QRS duration 72 ms QT/QTc 374/479 ms P-R-T axes 44 54 74 Normal sinus rhythm with sinus arrhythmia Possible Anterolateral infarct , age undetermined Abnormal ECG  Assessment/Plan Principal Problem:   Alcohol withdrawal hallucinosis (HCC) Admit to stepdown/inpatient. Continue CIWA protocol. Daily thiamine. Daily folic acid. Daily MVI. Consider [facility] consult. Continue IV hydration and electrolyte replacement.  Active Problems:   Hyperlipemia No longer taking statin. Statin may not be advisable if history of alcohol abuse. Should follow-up with his PCP as an outpatient after discharge.    Essential hypertension, benign No longer on lisinopril. Will use IV metoprolol while treating DTs. Monitor BP and HR.    Hyperglycemia Check hemoglobin A1c. CBG monitoring before meals and at bedtime.    BPH with urinary obstruction No longer on medication    Hypomagnesemia Replaced. Follow-up magnesium level.    Polycythemia Likely due to dehydration. The patient normally drinks 12 pack daily.    Abnormal EKG Check echocardiogram.    DVT prophylaxis: SCDs. Code Status: Full code. Family Communication:  Disposition Plan: Admit for EtOH withdrawal treatment. Consults called:  Admission status: Inpatient/telemetry.   Reubin Milan MD [facility]  08/07/2018, 9:22 PM

## 2018-08-07 NOTE — ED Provider Notes (Signed)
[facility] EMERGENCY DEPARTMENT Provider Note   CSN: [id number] Arrival date & time: 08/07/18  1519     History   Chief Complaint Chief Complaint  Patient presents with  . Numbness    HPI Miguel Solis is a 68 y.o. male.  Patient brought in by family member.  The initial complaint was intermittent numbness to right hand since 6 in the morning.  With talking with family member he woke up this morning was fine about 2 hours later started to get the tremors and seemed to be confused.  Patient has a history of heavy drinking.  Patient was fine yesterday.  There was some report of a fall this morning at 6 in the morning.  Patient without any specific complaints.  States he has been seeing things like bugs and that he can stop his extremities from doing the tremor.  Patient denies any specific pain.     Past Medical History:  Diagnosis Date  . Colon polyp 2008  . Foley catheter in place   . Gout   . Hyperlipemia   . Hyperlipidemia   . Hypertension   . Impaired fasting glucose   . Urinary retention     Patient Active Problem List   Diagnosis Date Noted  . 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 BPH with urinary obstruction 05/04/2018  . Alcohol abuse 04/03/2017  . Prostate hypertrophy 01/10/2013  . Elevated PSA 12/05/2012  . Hyperlipemia 11/08/2012  . Essential hypertension, benign 11/08/2012  . Hyperglycemia 11/08/2012    Past Surgical History:  Procedure Laterality Date  . COLONOSCOPY N/A 08/18/2017   Procedure: COLONOSCOPY;  Surgeon: Daneil Dolin, MD;  [facility];  Service: Endoscopy;  Laterality: N/A;  10:30  . KNEE SURGERY Left yrs ago   torn meniscus  . POLYPECTOMY  08/18/2017   Procedure: POLYPECTOMY;  Surgeon: Daneil Dolin, MD;  [facility];  Service: Endoscopy;;  cecal x2 (cold snare)and ascending colon x2 (hot snare)  . TRANSURETHRAL RESECTION OF PROSTATE N/A 05/04/2018   Procedure: TRANSURETHRAL RESECTION  OF THE PROSTATE (TURP);  Surgeon: Kathie Rhodes, MD;  Location: [facility];  Service: Urology;  Laterality: N/A;        Home Medications    Prior to Admission medications   Medication Sig Start Date End Date Taking? Authorizing Provider  amoxicillin-clavulanate (AUGMENTIN) 500-125 MG tablet Take 1 tablet by mouth every 12 (twelve) hours. 04/29/18   [provider]  atorvastatin (LIPITOR) 40 MG tablet Take 1 tablet (40 mg total) by mouth daily. 10/16/17   [name], MD  colchicine 0.6 MG tablet Take 1 tablet (0.6 mg total) by mouth 2 (two) times daily as needed. 12/20/17   Leandrew Koyanagi, MD  lisinopril (PRINIVIL,ZESTRIL) 10 MG tablet Take 1 tablet (10 mg total) by mouth daily. Patient not taking: Reported on 05/05/2018 10/16/17 06/16/21  [name], MD  phenazopyridine (PYRIDIUM) 200 MG tablet Take 1 tablet (200 mg total) by mouth 3 (three) times daily as needed for pain. 05/04/18   Kathie Rhodes, MD  traMADol (ULTRAM) 50 MG tablet Take 1 tablet (50 mg total) by mouth every 6 (six) hours as needed. 05/04/18   Kathie Rhodes, MD    Family History Family History  Problem Relation Age of Onset  . Cancer Mother     Social History Social History   Tobacco Use  . Smoking status: Never Smoker  . Smokeless tobacco: Never Used  Substance  Use Topics  . Alcohol use: Yes    Comment: occ  . Drug use: No     Allergies   Patient has no known allergies.   Review of Systems Review of Systems  Constitutional: Negative for chills and fever.  HENT: Negative for rhinorrhea and sore throat.   Eyes: Negative for visual disturbance.  Respiratory: Negative for cough and shortness of breath.   Cardiovascular: Negative for chest pain and leg swelling.  Gastrointestinal: Negative for abdominal pain, diarrhea, nausea and vomiting.  Genitourinary: Negative for dysuria.  Musculoskeletal: Negative for back pain and neck pain.  Skin: Negative for rash.    Neurological: Positive for tremors. Negative for dizziness, light-headedness and headaches.  Hematological: Does not bruise/bleed easily.  Psychiatric/Behavioral: Positive for confusion and hallucinations.     Physical Exam Updated Vital Signs BP (!) 165/98   Pulse (!) 106   Temp 98.7 F (37.1 C) (Oral)   Resp (!) 22   Ht 1.778 m (5\' 10" )   Wt 60.8 kg   SpO2 99%   BMI 19.23 kg/m   Physical Exam Vitals signs and nursing note reviewed.  Constitutional:      Appearance: He is well-developed.  HENT:     Head: Normocephalic and atraumatic.     Mouth/Throat:     Mouth: Mucous membranes are dry.  Eyes:     Conjunctiva/sclera: Conjunctivae normal.  Neck:     Musculoskeletal: Neck supple.  Cardiovascular:     Rate and Rhythm: Regular rhythm. Tachycardia present.     Heart sounds: No murmur.  Pulmonary:     Effort: Pulmonary effort is normal. No respiratory distress.     Breath sounds: Normal breath sounds.  Abdominal:     Palpations: Abdomen is soft.     Tenderness: There is no abdominal tenderness.  Musculoskeletal:        General: No swelling.  Skin:    General: Skin is warm and dry.  Neurological:     General: No focal deficit present.     Mental Status: He is alert.     Comments: Tremors some visual hallucinations seeing bugs.  Able to move all 4 extremities fine.      ED Treatments / Results  Labs (all labs ordered are listed, but only abnormal results are displayed) Labs Reviewed  CBC - Abnormal; Notable for the following components:      Result Value   WBC 10.8 (*)    Hemoglobin 19.1 (*)    HCT 56.8 (*)    MCV 105.6 (*)    MCH 35.5 (*)    All other components within normal limits  DIFFERENTIAL - Abnormal; Notable for the following components:   Basophils Absolute 0.2 (*)    All other components within normal limits  COMPREHENSIVE METABOLIC PANEL - Abnormal; Notable for the following components:   CO2 21 (*)    Glucose, Bld 169 (*)    BUN 7 (*)     Total Protein 8.7 (*)    AST 70 (*)    All other components within normal limits  CBG MONITORING, ED - Abnormal; Notable for the following components:   Glucose-Capillary 183 (*)    All other components within normal limits  TROPONIN I  AMMONIA  LIPASE, BLOOD  PROTIME-INR  APTT  ETHANOL  RAPID URINE DRUG SCREEN, HOSP PERFORMED  I-STAT TROPONIN, ED    EKG EKG Interpretation  Date/Time:  Tuesday August 07 2018 15:29:57 EST Ventricular Rate:  99 PR Interval:  146  QRS Duration: 72 QT Interval:  374 QTC Calculation: 479 R Axis:   54 Text Interpretation:  Normal sinus rhythm with sinus arrhythmia Possible Anterolateral infarct , age undetermined Abnormal ECG Confirmed by Fredia Sorrow [phone]) on 08/07/2018 4:23:19 PM   Radiology Ct Head Wo Contrast  Result Date: 08/07/2018 CLINICAL DATA:  Right hand numbness since 6 a.m. today. Multiple falls due to heavy drinking. EXAM: CT HEAD WITHOUT CONTRAST TECHNIQUE: Contiguous axial images were obtained from the base of the skull through the vertex without intravenous contrast. COMPARISON:  Head CT dated 03/06/2018. FINDINGS: Brain: Stable mildly enlarged ventricles and cortical sulci. Minimal patchy white matter low density in both cerebral hemispheres. No intracranial hemorrhage, mass lesion or CT evidence of acute infarction. Vascular: No hyperdense vessel or unexpected calcification. Skull: Normal. Negative for fracture or focal lesion. Sinuses/Orbits: Mild bilateral sphenoid, bilateral ethmoid and right maxillary sinus mucosal thickening. Unremarkable orbits. Other: None. IMPRESSION: 1. No acute abnormality. 2. Stable mild atrophy and minimal chronic small vessel white matter ischemic changes in both cerebral hemispheres. 3. Mild chronic sinusitis. Electronically Signed   By: Claudie Revering M.D.   On: 08/07/2018 17:57    Procedures Procedures (including critical care time)  CRITICAL CARE Performed by: Fredia Sorrow Total critical care  time: 30 minutes Critical care time was exclusive of separately billable procedures and treating other patients. Critical care was necessary to treat or prevent imminent or life-threatening deterioration. Critical care was time spent personally by me on the following activities: development of treatment plan with patient and/or surrogate as well as nursing, discussions with consultants, evaluation of patient's response to treatment, examination of patient, obtaining history from patient or surrogate, ordering and performing treatments and interventions, ordering and review of laboratory studies, ordering and review of radiographic studies, pulse oximetry and re-evaluation of patient's condition.   Medications Ordered in ED Medications  0.9 %  sodium chloride infusion ( Intravenous New Bag/Given 08/07/18 1832)  LORazepam (ATIVAN) tablet 1 mg (has no administration in time range)    Or  LORazepam (ATIVAN) injection 1 mg (has no administration in time range)  thiamine (VITAMIN B-1) tablet 100 mg (has no administration in time range)    Or  thiamine (B-1) injection 100 mg (has no administration in time range)  folic acid (FOLVITE) tablet 1 mg (has no administration in time range)  multivitamin with minerals tablet 1 tablet (has no administration in time range)  LORazepam (ATIVAN) injection 0-4 mg (has no administration in time range)    Followed by  LORazepam (ATIVAN) injection 0-4 mg (has no administration in time range)  LORazepam (ATIVAN) injection 1 mg (1 mg Intravenous Given 08/07/18 1637)  sodium chloride 0.9 % bolus 1,000 mL (0 mLs Intravenous Stopped 08/07/18 1823)  LORazepam (ATIVAN) injection 1 mg (1 mg Intravenous Given 08/07/18 1849)     Initial Impression / Assessment and Plan / ED Course  I have reviewed the triage vital signs and the nursing notes.  Pertinent labs & imaging results that were available during my care of the patient were reviewed by me and considered in my medical  decision making (see chart for details).     Patient's presentation consistent with alcohol withdrawal with some delirium and may be some tremens.  Responded well to initial Ativan which took the tremors away.  Patient still having some intermittent hallucinations.  Initially heart rate was in the 120s with the Ativan is down to the low 100s.  Patient not completely out of the woods  yet but showing signs of improvement.  Patient was fine yesterday.  Do not feel that this was a stroke or neurologic stroke event.  Patient stroke scale was upon presentation.  There was apparently some numbness.  But he had tremors all over.  Feels everything is is related to alcohol withdrawal.  Discussed with the hospitalist.  Patient started on [place] protocol.  Seems to be improving with the Ativan.  Will require admission to stepdown unit.  Alcohol level was negative ammonia slightly elevated at 30.  Labs otherwise without significant abnormalities.  CT head without any acute findings.      Final Clinical Impressions(s) / ED Diagnoses   Final diagnoses:  Alcohol withdrawal hallucinosis [facility])    ED Discharge Orders    None       Fredia Sorrow, MD 08/07/18 2052

## 2018-08-07 NOTE — ED Notes (Signed)
Pt's leg is jerking and he is seeing bugs and is slightly agitated.

## 2018-08-07 NOTE — ED Notes (Signed)
Pt says he sees bugs on the wall.  Dr Rogene Houston informed of CIWA scale and would like to admin. 1 mg of Ativan IV.

## 2018-08-07 NOTE — ED Notes (Signed)
Pt is flush and shaking.  Pt says he has trouble controlling his body.  LKW was 08/06/2018 @ 1600 by wife.  Wife reports pt fell last night and this am.  Denies hitting head on either fall.  Pt reports that he started shaking at 10 am.

## 2018-08-08 ENCOUNTER — Inpatient Hospital Stay (HOSPITAL_COMMUNITY): Payer: Medicare Other

## 2018-08-08 DIAGNOSIS — G8191 Hemiplegia, unspecified affecting right dominant side: Secondary | ICD-10-CM

## 2018-08-08 DIAGNOSIS — I639 Cerebral infarction, unspecified: Secondary | ICD-10-CM

## 2018-08-08 DIAGNOSIS — I10 Essential (primary) hypertension: Secondary | ICD-10-CM

## 2018-08-08 DIAGNOSIS — Z8673 Personal history of transient ischemic attack (TIA), and cerebral infarction without residual deficits: Secondary | ICD-10-CM | POA: Diagnosis present

## 2018-08-08 DIAGNOSIS — N401 Enlarged prostate with lower urinary tract symptoms: Secondary | ICD-10-CM

## 2018-08-08 DIAGNOSIS — N138 Other obstructive and reflux uropathy: Secondary | ICD-10-CM

## 2018-08-08 LAB — GLUCOSE, CAPILLARY
Glucose-Capillary: 143 mg/dL — ABNORMAL HIGH (ref 70–99)
Glucose-Capillary: 117 mg/dL — ABNORMAL HIGH (ref 70–99)
Glucose-Capillary: 123 mg/dL — ABNORMAL HIGH (ref 70–99)

## 2018-08-08 LAB — CBC WITH DIFFERENTIAL/PLATELET
Abs Immature Granulocytes: 0.01 10*3/uL (ref 0.00–0.07)
Basophils Absolute: 0.1 10*3/uL (ref 0.0–0.1)
Basophils Relative: 1 %
Eosinophils Absolute: 0.5 10*3/uL (ref 0.0–0.5)
Eosinophils Relative: 7 %
HCT: 49 % (ref 39.0–52.0)
Hemoglobin: 16.5 g/dL (ref 13.0–17.0)
Immature Granulocytes: 0 %
LYMPHS PCT: 30 %
Lymphs Abs: 2.2 10*3/uL (ref 0.7–4.0)
MCH: 35.6 pg — ABNORMAL HIGH (ref 26.0–34.0)
MCHC: 33.7 g/dL (ref 30.0–36.0)
MCV: 105.6 fL — ABNORMAL HIGH (ref 80.0–100.0)
Monocytes Absolute: 0.6 10*3/uL (ref 0.1–1.0)
Monocytes Relative: 8 %
Neutro Abs: 4 10*3/uL (ref 1.7–7.7)
Neutrophils Relative %: 54 %
Platelets: 235 10*3/uL (ref 150–400)
RBC: 4.64 MIL/uL (ref 4.22–5.81)
RDW: 12.8 % (ref 11.5–15.5)
WBC: 7.3 10*3/uL (ref 4.0–10.5)
nRBC: 0 % (ref 0.0–0.2)

## 2018-08-08 LAB — RAPID URINE DRUG SCREEN, HOSP PERFORMED
Amphetamines: NOT DETECTED
Barbiturates: NOT DETECTED
Benzodiazepines: POSITIVE — AB
Cocaine: NOT DETECTED
OPIATES: NOT DETECTED
Tetrahydrocannabinol: NOT DETECTED

## 2018-08-08 LAB — COMPREHENSIVE METABOLIC PANEL
ALT: 28 U/L (ref 0–44)
ANION GAP: 7 (ref 5–15)
AST: 42 U/L — ABNORMAL HIGH (ref 15–41)
Albumin: 2.9 g/dL — ABNORMAL LOW (ref 3.5–5.0)
Alkaline Phosphatase: 78 U/L (ref 38–126)
BUN: 7 mg/dL — ABNORMAL LOW (ref 8–23)
CO2: 22 mmol/L (ref 22–32)
Calcium: 8.1 mg/dL — ABNORMAL LOW (ref 8.9–10.3)
Chloride: 110 mmol/L (ref 98–111)
Creatinine, Ser: 0.67 mg/dL (ref 0.61–1.24)
GFR calc Af Amer: >60 mL/min (ref >60)
GFR calc non Af Amer: >60 mL/min (ref >60)
Glucose, Bld: 138 mg/dL — ABNORMAL HIGH (ref 70–99)
Potassium: 3.2 mmol/L — ABNORMAL LOW (ref 3.5–5.1)
Sodium: 139 mmol/L (ref 135–145)
Total Bilirubin: 1.9 mg/dL — ABNORMAL HIGH (ref 0.3–1.2)
Total Protein: 6.6 g/dL (ref 6.5–8.1)

## 2018-08-08 LAB — MRSA PCR SCREENING: MRSA BY PCR: POSITIVE — AB

## 2018-08-08 LAB — HEMOGLOBIN A1C
Hgb A1c MFr Bld: 6.7 % — ABNORMAL HIGH (ref 4.8–5.6)
Mean Plasma Glucose: 145.59 mg/dL

## 2018-08-08 MED ORDER — LISINOPRIL 10 MG PO TABS
10.0000 mg | ORAL_TABLET | Freq: Every day | ORAL | Status: DC
  Administered 2018-08-08 – 2018-08-10 (×3): 10 mg via ORAL
  Filled 2018-08-08 (×3): qty 1

## 2018-08-08 MED ORDER — ENOXAPARIN SODIUM 40 MG/0.4ML ~~LOC~~ SOLN
40.0000 mg | SUBCUTANEOUS | Status: DC
  Administered 2018-08-08 – 2018-08-09 (×2): 40 mg via SUBCUTANEOUS
  Filled 2018-08-08 (×3): qty 0.4

## 2018-08-08 MED ORDER — ASPIRIN 300 MG RE SUPP
300.0000 mg | Freq: Every day | RECTAL | Status: DC
  Filled 2018-08-08: qty 1

## 2018-08-08 MED ORDER — POTASSIUM CHLORIDE IN NACL 20-0.45 MEQ/L-% IV SOLN
INTRAVENOUS | Status: DC
  Administered 2018-08-08 – 2018-08-10 (×5): via INTRAVENOUS
  Filled 2018-08-08 (×17): qty 1000

## 2018-08-08 MED ORDER — METOPROLOL TARTRATE 5 MG/5ML IV SOLN
5.0000 mg | Freq: Four times a day (QID) | INTRAVENOUS | Status: DC | PRN
  Administered 2018-08-10: 5 mg via INTRAVENOUS
  Filled 2018-08-08: qty 5

## 2018-08-08 MED ORDER — STROKE: EARLY STAGES OF RECOVERY BOOK
Freq: Once | Status: AC
  Administered 2018-08-08: 19:00:00
  Filled 2018-08-08: qty 1

## 2018-08-08 MED ORDER — ASPIRIN 325 MG PO TABS
325.0000 mg | ORAL_TABLET | Freq: Every day | ORAL | Status: DC
  Administered 2018-08-08 – 2018-08-10 (×3): 325 mg via ORAL
  Filled 2018-08-08 (×3): qty 1

## 2018-08-08 NOTE — Progress Notes (Signed)
PROGRESS NOTE    Miguel Solis  KDX:[id number] DOB: 12-23-1950 DOA: 08/07/2018 PCP: [name], MD    Brief Narrative:  68 year old male with a long history of alcohol use, presented with right hand and numbness/weakness.  He also had hallucinations and tremors.  He was found to have alcohol withdrawal and was admitted for further treatment.  When his neurologic symptoms did not improve, MRI of the brain was performed that confirmed left-sided watershed infarcts.  Further work-up for stroke is underway.   Assessment & Plan:   Principal Problem:   Alcohol withdrawal hallucinosis (HCC) Active Problems:   Hyperlipemia   Essential hypertension, benign   Hyperglycemia   BPH with urinary obstruction   Hypomagnesemia   Polycythemia   Abnormal EKG   Acute CVA (cerebrovascular accident) ([place])   Right hemiparesis ([place])   1. Acute CVA.  Patient presented with right-sided numbness/weakness.  He also had hallucinations and tremors at that time.  It was felt that his symptoms were related to alcohol withdrawal, since he reported improvement after receiving Ativan.  Today, his right-sided weakness has persisted.  MRI imaging of the brain shows left-sided watershed infarct.  MRA head was found to be negative.  Will check echocardiogram to carotid Dopplers.  Check A1c and lipid panel.  Start on aspirin.  Will get neurology input. 2. Alcohol withdrawal.  Patient drinks approximately 12 beers per day.  He had not had any alcohol for approximately 2 days prior to admission.  He presented with tremors, hallucinations.  He is currently on CIWA protocol with Ativan.  Overall withdrawal appears to be stable. 3. Hypertension.  Currently using IV metoprolol.  Monitor blood pressure. 4. Hyperlipidemia.  Was previously on a statin, currently on hold.  Check lipid panel restart statin as appropriate. 5. Dehydration.  Improved with IV fluids.   DVT prophylaxis: Lovenox Code Status: Full code Family  Communication: Discussed with wife at the bedside Disposition Plan: Discharge home once improved   Consultants:     Procedures:     Antimicrobials:       Subjective: Wife feels that overall hallucinations are better.  He is less tremulous today.  Continues to have weakness on the right side.  Complains of chronic pain in his right knee.  Objective: Vitals:   08/08/18 1300 08/08/18 1400 08/08/18 1500 08/08/18 1623  BP:  140/87 136/89   Pulse: 77 88 88 86  Resp: 16 16 19 17   Temp:    99 F (37.2 C)  TempSrc:    Oral  SpO2: 97% 97% 97% 97%  Weight:      Height:        Intake/Output Summary (Last 24 hours) at 08/08/2018 1721 Last data filed at 08/08/2018 1445 Gross per 24 hour  Intake 3313.47 ml  Output 600 ml  Net 2713.47 ml   Filed Weights   08/07/18 1530 08/08/18 0139 08/08/18 0500  Weight: 60.8 kg 81.8 kg 81.8 kg    Examination:  General exam: Appears calm and comfortable  Respiratory system: Clear to auscultation. Respiratory effort normal. Cardiovascular system: S1 & S2 heard, RRR. No JVD, murmurs, rubs, gallops or clicks. No pedal edema. Gastrointestinal system: Abdomen is nondistended, soft and nontender. No organomegaly or masses felt. Normal bowel sounds heard. Central nervous system: Alert and oriented.  Right lower extremity and upper extremity with 3 to 4/5 strength, 5 out of 5 strength in left upper and lower extremity Extremities: Symmetric  Skin: No rashes, lesions or ulcers Psychiatry: Judgement  and insight appear normal. Mood & affect appropriate.     Data Reviewed: I have personally reviewed following labs and imaging studies  CBC: Recent Labs  Lab 08/07/18 1530 08/08/18 0436  WBC 10.8* 7.3  NEUTROABS 6.9 4.0  HGB 19.1* 16.5  HCT 56.8* 49.0  MCV 105.6* 105.6*  PLT 301 664   Basic Metabolic Panel: Recent Labs  Lab 08/07/18 1530 08/07/18 1710 08/08/18 0436  NA 138  --  139  K 3.6  --  3.2*  CL 103  --  110  CO2 21*  --  22    GLUCOSE 169*  --  138*  BUN 7*  --  7*  CREATININE 0.80  --  0.67  CALCIUM 9.2  --  8.1*  MG  --  1.4*  --   PHOS  --  2.6  --    GFR: Estimated Creatinine Clearance: 91.3 mL/min (by C-G formula based on SCr of 0.67 mg/dL). Liver Function Tests: Recent Labs  Lab 08/07/18 1530 08/08/18 0436  AST 70* 42*  ALT 42 28  ALKPHOS 109 78  BILITOT 1.1 1.9*  PROT 8.7* 6.6  ALBUMIN 3.9 2.9*   Recent Labs  Lab 08/07/18 1643  LIPASE 45   Recent Labs  Lab 08/07/18 1643  AMMONIA 30   Coagulation Profile: Recent Labs  Lab 08/07/18 1643  INR 1.07   Cardiac Enzymes: Recent Labs  Lab 08/07/18 1530  TROPONINI <0.03   BNP (last 3 results) No results for input(s): PROBNP in the last 8760 hours. HbA1C: Recent Labs    08/08/18 0436  HGBA1C 6.7*   CBG: Recent Labs  Lab 08/07/18 1614 08/08/18 0810 08/08/18 1622  GLUCAP 183* 143* 117*   Lipid Profile: No results for input(s): CHOL, HDL, LDLCALC, TRIG, CHOLHDL, LDLDIRECT in the last 72 hours. Thyroid Function Tests: No results for input(s): TSH, T4TOTAL, FREET4, T3FREE, THYROIDAB in the last 72 hours. Anemia Panel: No results for input(s): VITAMINB12, FOLATE, FERRITIN, TIBC, IRON, RETICCTPCT in the last 72 hours. Sepsis Labs: No results for input(s): PROCALCITON, LATICACIDVEN in the last 168 hours.  Recent Results (from the past 240 hour(s))  MRSA PCR Screening     Status: Abnormal   Collection Time: 08/08/18  1:23 AM  Result Value Ref Range Status   MRSA by PCR POSITIVE (A) NEGATIVE Final    Comment:        The GeneXpert MRSA Assay (FDA approved for NASAL specimens only), is one component of a comprehensive MRSA colonization surveillance program. It is not intended to diagnose MRSA infection nor to guide or monitor treatment for MRSA infections. RESULT CALLED TO, READ BACK BY AND VERIFIED WITH: MCDANIEL M. AT 4034 A ON [id number] BY THOMPSON S. Performed at [facility], [address] [place], [place]  [address]          Radiology Studies: Ct Head Wo Contrast  Result Date: 08/07/2018 CLINICAL DATA:  Right hand numbness since 6 a.m. today. Multiple falls due to heavy drinking. EXAM: CT HEAD WITHOUT CONTRAST TECHNIQUE: Contiguous axial images were obtained from the base of the skull through the vertex without intravenous contrast. COMPARISON:  Head CT dated 03/06/2018. FINDINGS: Brain: Stable mildly enlarged ventricles and cortical sulci. Minimal patchy white matter low density in both cerebral hemispheres. No intracranial hemorrhage, mass lesion or CT evidence of acute infarction. Vascular: No hyperdense vessel or unexpected calcification. Skull: Normal. Negative for fracture or focal lesion. Sinuses/Orbits: Mild bilateral sphenoid, bilateral ethmoid and right maxillary sinus mucosal thickening.  Unremarkable orbits. Other: None. IMPRESSION: 1. No acute abnormality. 2. Stable mild atrophy and minimal chronic small vessel white matter ischemic changes in both cerebral hemispheres. 3. Mild chronic sinusitis. Electronically Signed   By: Claudie Revering M.D.   On: 08/07/2018 17:57   Mr Jodene Nam Head Wo Contrast  Result Date: 08/08/2018 CLINICAL DATA:  Right-sided numbness 1 day EXAM: MRI HEAD WITHOUT CONTRAST MRA HEAD WITHOUT CONTRAST TECHNIQUE: Multiplanar, multiecho pulse sequences of the brain and surrounding structures were obtained without intravenous contrast. Angiographic images of the head were obtained using MRA technique without contrast. COMPARISON:  CT head 08/07/2018 FINDINGS: MRI HEAD FINDINGS Brain: Acute infarct in the deep white matter of the left frontal and parietal lobe compatible with watershed infarct. Mild atrophy and mild chronic microvascular ischemic change in the white matter bilaterally. Negative for hemorrhage or mass. No hydrocephalus. Vascular: Normal arterial flow voids Skull and upper cervical spine: Negative Sinuses/Orbits: Mild mucosal edema paranasal sinuses.  Normal orbit Other:  None MRA HEAD FINDINGS Right vertebral dominant and supplies the basilar. Small left vertebral artery ends in PICA. Right PICA patent. Basilar widely patent. Superior cerebellar and posterior cerebral arteries patent bilaterally. Fetal origin of the right posterior cerebral artery. Internal carotid artery widely patent bilaterally without stenosis. Anterior and middle cerebral arteries patent bilaterally. Apparent stenosis at the proximal right M1 segment probably is due to artifact. Negative for cerebral aneurysm. IMPRESSION: Acute watershed infarct in the deep white matter of the left cerebral hemisphere Mild atrophy and mild chronic microvascular ischemia Negative MRA head Electronically Signed   By: Franchot Gallo M.D.   On: 08/08/2018 13:15   Mr Brain Wo Contrast  Result Date: 08/08/2018 CLINICAL DATA:  Right-sided numbness 1 day EXAM: MRI HEAD WITHOUT CONTRAST MRA HEAD WITHOUT CONTRAST TECHNIQUE: Multiplanar, multiecho pulse sequences of the brain and surrounding structures were obtained without intravenous contrast. Angiographic images of the head were obtained using MRA technique without contrast. COMPARISON:  CT head 08/07/2018 FINDINGS: MRI HEAD FINDINGS Brain: Acute infarct in the deep white matter of the left frontal and parietal lobe compatible with watershed infarct. Mild atrophy and mild chronic microvascular ischemic change in the white matter bilaterally. Negative for hemorrhage or mass. No hydrocephalus. Vascular: Normal arterial flow voids Skull and upper cervical spine: Negative Sinuses/Orbits: Mild mucosal edema paranasal sinuses.  Normal orbit Other: None MRA HEAD FINDINGS Right vertebral dominant and supplies the basilar. Small left vertebral artery ends in PICA. Right PICA patent. Basilar widely patent. Superior cerebellar and posterior cerebral arteries patent bilaterally. Fetal origin of the right posterior cerebral artery. Internal carotid artery widely patent bilaterally without  stenosis. Anterior and middle cerebral arteries patent bilaterally. Apparent stenosis at the proximal right M1 segment probably is due to artifact. Negative for cerebral aneurysm. IMPRESSION: Acute watershed infarct in the deep white matter of the left cerebral hemisphere Mild atrophy and mild chronic microvascular ischemia Negative MRA head Electronically Signed   By: Franchot Gallo M.D.   On: 08/08/2018 13:15        Scheduled Meds: .  stroke: mapping our early stages of recovery book   Does not apply Once  . aspirin  300 mg Rectal Daily   Or  . aspirin  325 mg Oral Daily  . folic acid  1 mg Oral Daily  . lisinopril  10 mg Oral Daily  . multivitamin with minerals  1 tablet Oral Daily  . thiamine  100 mg Oral Daily   Or  . thiamine  100  mg Intravenous Daily   Continuous Infusions: . 0.45 % NaCl with KCl 20 mEq / L 125 mL/hr at 08/08/18 0655     LOS: 1 day    Time spent: 49mins    Kathie Dike, MD [facility]   If 7PM-7AM, please contact night-coverage [email or web address]  08/08/2018, 5:21 PM

## 2018-08-09 ENCOUNTER — Inpatient Hospital Stay (HOSPITAL_COMMUNITY): Payer: Medicare Other

## 2018-08-09 DIAGNOSIS — I639 Cerebral infarction, unspecified: Secondary | ICD-10-CM

## 2018-08-09 LAB — GLUCOSE, CAPILLARY
GLUCOSE-CAPILLARY: 107 mg/dL — AB (ref 70–99)
Glucose-Capillary: 126 mg/dL — ABNORMAL HIGH (ref 70–99)
Glucose-Capillary: 172 mg/dL — ABNORMAL HIGH (ref 70–99)
Glucose-Capillary: 134 mg/dL — ABNORMAL HIGH (ref 70–99)

## 2018-08-09 LAB — BASIC METABOLIC PANEL
Anion gap: 6 (ref 5–15)
BUN: 6 mg/dL — ABNORMAL LOW (ref 8–23)
CO2: 21 mmol/L — ABNORMAL LOW (ref 22–32)
Calcium: 8.3 mg/dL — ABNORMAL LOW (ref 8.9–10.3)
Chloride: 108 mmol/L (ref 98–111)
Creatinine, Ser: 0.66 mg/dL (ref 0.61–1.24)
GFR calc Af Amer: >60 mL/min (ref >60)
GFR calc non Af Amer: >60 mL/min (ref >60)
GLUCOSE: 130 mg/dL — AB (ref 70–99)
Potassium: 3.6 mmol/L (ref 3.5–5.1)
Sodium: 135 mmol/L (ref 135–145)

## 2018-08-09 LAB — LIPID PANEL
CHOL/HDL RATIO: 6.4 ratio
Cholesterol: 223 mg/dL — ABNORMAL HIGH (ref 0–200)
HDL: 35 mg/dL — ABNORMAL LOW (ref >40)
LDL CALC: 165 mg/dL — AB (ref 0–99)
Triglycerides: 114 mg/dL (ref <150)
VLDL: 23 mg/dL (ref 0–40)

## 2018-08-09 LAB — ECHOCARDIOGRAM COMPLETE
Height: 70 in
Weight: 2885.38 oz

## 2018-08-09 LAB — HEMOGLOBIN A1C
Hgb A1c MFr Bld: 6.7 % — ABNORMAL HIGH (ref 4.8–5.6)
Mean Plasma Glucose: 145.59 mg/dL

## 2018-08-09 LAB — HIV ANTIBODY (ROUTINE TESTING W REFLEX): HIV SCREEN 4TH GENERATION: NONREACTIVE

## 2018-08-09 LAB — MAGNESIUM: Magnesium: 2 mg/dL (ref 1.7–2.4)

## 2018-08-09 MED ORDER — ATORVASTATIN CALCIUM 40 MG PO TABS
40.0000 mg | ORAL_TABLET | Freq: Every day | ORAL | Status: DC
  Administered 2018-08-09: 40 mg via ORAL
  Filled 2018-08-09 (×2): qty 1

## 2018-08-09 NOTE — Evaluation (Signed)
Physical Therapy Evaluation Patient Details Name: Miguel Solis MRN: [id number] DOB: 13-Jan-1951 Today's Date: 08/09/2018   History of Present Illness  Miguel Solis is a 68 y.o. male with medical history significant of colon polyps, gout, hyperlipidemia, hypertension, glucose intolerance, alcohol dependence who is coming to the emergency department with complaints of intermittent right hand numbness since 6 in the morning, but also noticed to have tremors, hallucinosis (states he is seeing things like bugs), tremors and is stating that he has inability to control his body from shaking.  Family member stated that he is heavy drinker in the emergency department.  When seen in the ED, the patient does open his eyes briefly.  However, once I saw him in the ICU, he woke up and although somnolent, was able to answer some questions.  He stated he has not had anything to drink in the past 2 days.  He normally drinks about a 12 pack of beers daily.  That was similar to the amount that was provided by family members to the ICU nursing staff.  He denies having any pain or any other symptoms that he could remember.  However, the patient was still mildly sedated with lorazepam.    Clinical Impression  Patient functioning near baseline for functional mobility and gait other than new weakness on right side with difficulty keeping trunk in midline when attempting to raise RUE above shoulder level while seated at bedside, able to ambulate in room and hallways on level, inclined, and declined surfaces without loss of balance without using an AD.  Patient feels safer using SPC with good return demonstrated with mostly step-through gait pattern, had mild difficulty going up/down steps using SPC only, safer using side rail and advised to have his spouse assist him going up steps at home.  Patient tolerated sitting up at bedside to eat breakfast after therapy with his spouse present in room.  Patient will benefit from continued  physical therapy in hospital and recommended venue below to increase strength, balance, endurance for safe ADLs and gait.     Follow Up Recommendations Home health PT;Supervision - Intermittent    Equipment Recommendations  Cane(single point cane)    Recommendations for Other Services       Precautions / Restrictions Precautions Precautions: Fall Restrictions Weight Bearing Restrictions: No      Mobility  Bed Mobility Overal bed mobility: Modified Independent             General bed mobility comments: increased time  Transfers Overall transfer level: Modified independent Equipment used: None;Straight cane             General transfer comment: increased time, feels safer using single point cane [facility])  Ambulation/Gait Ambulation/Gait assistance: Supervision Gait Distance (Feet): 150 Feet Assistive device: None;Straight cane Gait Pattern/deviations: Step-through pattern;Decreased step length - right;Decreased stride length Gait velocity: decreased   General Gait Details: slightly labored slower than normal cadence without loss of balance without AD on level, inclined, and declined surfaces, patient feels safer using SPC with good return demonstrated with mostly 2 point gait pattern  Stairs            Wheelchair Mobility    Modified Rankin (Stroke Patients Only)       Balance Overall balance assessment: Mild deficits observed, not formally tested  Pertinent Vitals/Pain Pain Assessment: No/denies pain    Home Living Family/patient expects to be discharged to:: Private residence Living Arrangements: Spouse/significant other Available Help at Discharge: Family;Available 24 hours/day Type of Home: House Home Access: Stairs to enter Entrance Stairs-Rails: None Entrance Stairs-Number of Steps: 2 Home Layout: One level Home Equipment: None      Prior Function Level of Independence:  Independent         Comments: community ambulator, drives     Journalist, newspaper        Extremity/Trunk Assessment   Upper Extremity Assessment Upper Extremity Assessment: Defer to OT evaluation    Lower Extremity Assessment Lower Extremity Assessment: Overall WFL for tasks assessed;RLE deficits/detail;LLE deficits/detail RLE Deficits / Details: grossly -4/5 LLE Deficits / Details: grossly 5/5    Cervical / Trunk Assessment Cervical / Trunk Assessment: Normal  Communication   Communication: No difficulties  Cognition Arousal/Alertness: Awake/alert Behavior During Therapy: WFL for tasks assessed/performed Overall Cognitive Status: Within Functional Limits for tasks assessed                                        General Comments      Exercises     Assessment/Plan    PT Assessment Patient needs continued PT services  PT Problem List Decreased strength;Decreased activity tolerance;Decreased balance;Decreased mobility       PT Treatment Interventions Gait training;Stair training;Functional mobility training;Therapeutic activities;Patient/family education;Therapeutic exercise    PT Goals (Current goals can be found in the Care Plan section)  Acute Rehab PT Goals Patient Stated Goal: return home with spouse to assist PT Goal Formulation: With patient/family Time For Goal Achievement: 08/11/18 Potential to Achieve Goals: Good    Frequency 7X/week   Barriers to discharge        Co-evaluation               AM-PAC PT "6 Clicks" Mobility  Outcome Measure Help needed turning from your back to your side while in a flat bed without using bedrails?: None Help needed moving from lying on your back to sitting on the side of a flat bed without using bedrails?: None Help needed moving to and from a bed to a chair (including a wheelchair)?: None Help needed standing up from a chair using your arms (e.g., wheelchair or bedside chair)?: None Help  needed to walk in hospital room?: A Little Help needed climbing 3-5 steps with a railing? : A Lot 6 Click Score: 21    End of Session Equipment Utilized During Treatment: Gait belt Activity Tolerance: Patient tolerated treatment well Patient left: in bed;with call bell/phone within reach;with family/visitor present(seated at bedside) Nurse Communication: Mobility status PT Visit Diagnosis: Unsteadiness on feet (R26.81);Other abnormalities of gait and mobility (R26.89);Muscle weakness (generalized) (M62.81)    Time: 6578-4696 PT Time Calculation (min) (ACUTE ONLY): 34 min   Charges:   PT Evaluation $PT Eval Moderate Complexity: 1 Mod PT Treatments $Gait Training: 23-37 mins        2:07 PM, 08/09/18 Lonell Grandchild, MPT Physical Therapist with [facility] [phone] office [phone] mobile phone

## 2018-08-09 NOTE — Plan of Care (Signed)
  Problem: Acute Rehab PT Goals(only PT should resolve) Goal: Pt Will Go Supine/Side To Sit Outcome: Progressing Flowsheets (Taken 08/09/2018 1411) Pt will go Supine/Side to Sit: Independently Goal: Patient Will Transfer Sit To/From Stand Outcome: Progressing Flowsheets (Taken 08/09/2018 1411) Patient will transfer sit to/from stand: Independently Goal: Pt Will Transfer Bed To Chair/Chair To Bed Outcome: Progressing Flowsheets (Taken 08/09/2018 1411) Pt will Transfer Bed to Chair/Chair to Bed: with modified independence Goal: Pt Will Ambulate Outcome: Progressing Flowsheets (Taken 08/09/2018 1411) Pt will Ambulate: > 125 feet; with modified independence; with cane   2:12 PM, 08/09/18 Lonell Grandchild, MPT Physical Therapist with [facility] [phone] office [phone] mobile phone

## 2018-08-09 NOTE — Progress Notes (Signed)
*  PRELIMINARY RESULTS* Echocardiogram 2D Echocardiogram has been performed.  Miguel Solis 08/09/2018, 12:41 PM

## 2018-08-09 NOTE — Care Management (Signed)
Admitted with CVA. Pt from home, lives with wife. Pt has insurance and PCP. PT recommends HH PT and cane. pt agreeable, no preference of provider. Referral for [facility] and DME given to Romualdo Bolk, [facility] rep. Single Point Kasandra Knudsen will be delivered to pt room prior to DC.

## 2018-08-09 NOTE — Consult Note (Signed)
[place] A. Merlene Laughter, MD     [email or web address]          Miguel Solis is an 68 y.o. male.   ASSESSMENT/PLAN: 1.  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  I suspect could be due to cardioembolic phenomenon or hypovolemic state although no clear hypovolemic state has been documented.  Antiplatelet agent is recommended.  The patient may need to have an event monitor in the future.  Alcohol cessation is discussed with the patient.  Agree with the continuation of a statin medication.  2.  Episode of tactile hallucination and tremors of unclear etiology: EEG is recommended.  3.  Alcoholism: Typical precautions recommended.  Thiamine replacement is also recommended.    Patient is a 68 year old right-handed white male who presents with acute onset of weakness and numbness involving the right upper extremity.  He tells me that he the right leg has been normal.  The left side is normal.  He does not report having loss of consciousness, dysarthria or dysphasia.  He has had simultaneously other symptoms of tactile hallucinations although the wife did seem to think that the tactile hallucinations occurred after he was given IV Ativan in the emergency room.  He has had episode of jerky activity of all 4 extremities possible myoclonic activities.  He does not report having any alteration of consciousness with these events.  There is no previous history of seizures.  No dyspnea or chest pain is reported.  No palpitations.  The review of systems otherwise negative.  GENERAL: This is a pleasant male who is in no acute distress.  HEENT: Supple without evidence of trauma  ABDOMEN: soft  EXTREMITIES: No edema   BACK: Normal  SKIN: Normal by inspection.    MENTAL STATUS: Alert and oriented -including orientation to his age and the month. Speech, language and cognition are generally intact.  Judgment and insight normal.   CRANIAL NERVES: Pupils are equal, round and reactive to light and accomodation; extra ocular movements are full, there is no significant nystagmus; visual fields are full; upper and lower facial muscles are normal in strength and symmetric, there is no flattening of the nasolabial folds; tongue is midline; uvula is midline; shoulder elevation is normal.  MOTOR: Normal tone, bulk and strength; there is a mild drift involving the right upper extremity.  He has good hand dexterity of the right hand.  The other extremity shows normal tone, bulk and strength.  No drift is noted in the other extremities.  COORDINATION: Left finger to nose is normal, right finger to nose is normal, No rest tremor; no intention tremor; no postural tremor; no bradykinesia.  REFLEXES: Deep tendon reflexes are symmetrical and normal.   SENSATION: Normal to light touch, temperature, and pain.  There is no extinction on double simultaneous stimulation.    NIH stroke scale 1.   Blood pressure (!) 148/85, pulse 77, temperature 98.2 F (36.8 C), temperature source Oral, resp. rate 19, height 5\' 10"  (1.778 m), weight 81.8 kg, SpO2 97 %.  Past Medical History:  Diagnosis Date  . Colon polyp 2008  . Foley catheter in place   . Gout   . Hyperlipemia   . Hyperlipidemia   . Hypertension   . Impaired fasting glucose   . Urinary retention     Past Surgical History:  Procedure Laterality Date  . COLONOSCOPY N/A 08/18/2017   Procedure: COLONOSCOPY;  Surgeon: Daneil Dolin,  MD;  [facility];  Service: Endoscopy;  Laterality: N/A;  10:30  . KNEE SURGERY Left yrs ago   torn meniscus  . POLYPECTOMY  08/18/2017   Procedure: POLYPECTOMY;  Surgeon: Daneil Dolin, MD;  Location: [facility];  Service: Endoscopy;;  cecal x2 (cold snare)and ascending colon x2 (hot snare)  . TRANSURETHRAL RESECTION OF PROSTATE N/A 05/04/2018   Procedure: TRANSURETHRAL RESECTION OF THE PROSTATE  (TURP);  Surgeon: Kathie Rhodes, MD;  Location: [facility];  Service: Urology;  Laterality: N/A;    Family History  Problem Relation Age of Onset  . Cancer Mother     Social History:  reports that he has never smoked. He has never used smokeless tobacco. He reports current alcohol use. He reports that he does not use drugs.  Allergies: No Known Allergies  Medications: Prior to Admission medications   Medication Sig Start Date End Date Taking? Authorizing Provider  atorvastatin (LIPITOR) 40 MG tablet Take 1 tablet (40 mg total) by mouth daily. Patient not taking: Reported on 08/07/2018 10/16/17   [name], MD  lisinopril (PRINIVIL,ZESTRIL) 10 MG tablet Take 1 tablet (10 mg total) by mouth daily. Patient not taking: Reported on 05/05/2018 10/16/17 06/16/21  [name], MD    Scheduled Meds: . aspirin  300 mg Rectal Daily   Or  . aspirin  325 mg Oral Daily  . enoxaparin (LOVENOX) injection  40 mg Subcutaneous Q24H  . folic acid  1 mg Oral Daily  . lisinopril  10 mg Oral Daily  . multivitamin with minerals  1 tablet Oral Daily  . thiamine  100 mg Oral Daily   Or  . thiamine  100 mg Intravenous Daily   Continuous Infusions: . 0.45 % NaCl with KCl 20 mEq / L 125 mL/hr at 08/09/18 1614   PRN Meds:.LORazepam, metoprolol tartrate, ondansetron **OR** ondansetron (ZOFRAN) IV     Results for orders placed or performed during the hospital encounter of 08/07/18 (from the past 48 hour(s))  MRSA PCR Screening     Status: Abnormal   Collection Time: 08/08/18  1:23 AM  Result Value Ref Range   MRSA by PCR POSITIVE (A) NEGATIVE    Comment:        The GeneXpert MRSA Assay (FDA approved for NASAL specimens only), is one component of a comprehensive MRSA colonization surveillance program. It is not intended to diagnose MRSA infection nor to guide or monitor treatment for MRSA infections. RESULT CALLED TO, READ BACK BY AND VERIFIED WITH: MCDANIEL M. AT  [id number] A ON [id number] BY THOMPSON S. Performed at [facility], [address], [place], [place] [address]   HIV antibody (Routine Testing)     Status: None   Collection Time: 08/08/18  4:36 AM  Result Value Ref Range   HIV Screen 4th Generation wRfx Non Reactive Non Reactive    Comment: (NOTE) Performed At: [facility] [place], [place] [id number] Rush Farmer MD YI:[id number]   CBC WITH DIFFERENTIAL     Status: Abnormal   Collection Time: 08/08/18  4:36 AM  Result Value Ref Range   WBC 7.3 4.0 - 10.5 K/uL   RBC 4.64 4.22 - 5.81 MIL/uL   Hemoglobin 16.5 13.0 - 17.0 g/dL   HCT 49.0 39.0 - 52.0 %   MCV 105.6 (H) 80.0 - 100.0 fL   MCH 35.6 (H) 26.0 - 34.0 pg   MCHC 33.7 30.0 - 36.0 g/dL   RDW 12.8  11.5 - 15.5 %   Platelets 235 150 - 400 K/uL   nRBC 0.0 0.0 - 0.2 %   Neutrophils Relative % 54 %   Neutro Abs 4.0 1.7 - 7.7 K/uL   Lymphocytes Relative 30 %   Lymphs Abs 2.2 0.7 - 4.0 K/uL   Monocytes Relative 8 %   Monocytes Absolute 0.6 0.1 - 1.0 K/uL   Eosinophils Relative 7 %   Eosinophils Absolute 0.5 0.0 - 0.5 K/uL   Basophils Relative 1 %   Basophils Absolute 0.1 0.0 - 0.1 K/uL   Immature Granulocytes 0 %   Abs Immature Granulocytes 0.01 0.00 - 0.07 K/uL    Comment: Performed at [facility], [address], [place], [place] [address]  Comprehensive metabolic panel     Status: Abnormal   Collection Time: 08/08/18  4:36 AM  Result Value Ref Range   Sodium 139 135 - 145 mmol/L   Potassium 3.2 (L) 3.5 - 5.1 mmol/L   Chloride 110 98 - 111 mmol/L   CO2 22 22 - 32 mmol/L   Glucose, Bld 138 (H) 70 - 99 mg/dL   BUN 7 (L) 8 - 23 mg/dL   Creatinine, Ser 0.67 0.61 - 1.24 mg/dL   Calcium 8.1 (L) 8.9 - 10.3 mg/dL   Total Protein 6.6 6.5 - 8.1 g/dL   Albumin 2.9 (L) 3.5 - 5.0 g/dL   AST 42 (H) 15 - 41 U/L   ALT 28 0 - 44 U/L   Alkaline Phosphatase 78 38 - 126 U/L   Total Bilirubin 1.9 (H) 0.3 - 1.2 mg/dL   GFR calc non Af Amer >60 >60 mL/min   GFR calc Af  Amer >60 >60 mL/min   Anion gap 7 5 - 15    Comment: Performed at [facility], [address], [place], [place] [address]  Hemoglobin A1c     Status: Abnormal   Collection Time: 08/08/18  4:36 AM  Result Value Ref Range   Hgb A1c MFr Bld 6.7 (H) 4.8 - 5.6 %    Comment: (NOTE) Pre diabetes:          5.7%-6.4% Diabetes:              >6.4% Glycemic control for   <7.0% adults with diabetes    Mean Plasma Glucose 145.59 mg/dL    Comment: Performed at [facility], [place] [address], [place], [place] [address]  Glucose, capillary     Status: Abnormal   Collection Time: 08/08/18  8:10 AM  Result Value Ref Range   Glucose-Capillary 143 (H) 70 - 99 mg/dL  Rapid urine drug screen (hospital performed)     Status: Abnormal   Collection Time: 08/08/18  9:33 AM  Result Value Ref Range   Opiates NONE DETECTED NONE DETECTED   Cocaine NONE DETECTED NONE DETECTED   Benzodiazepines POSITIVE (A) NONE DETECTED   Amphetamines NONE DETECTED NONE DETECTED   Tetrahydrocannabinol NONE DETECTED NONE DETECTED   Barbiturates NONE DETECTED NONE DETECTED    Comment: (NOTE) DRUG SCREEN FOR MEDICAL PURPOSES ONLY.  IF CONFIRMATION IS NEEDED FOR ANY PURPOSE, NOTIFY LAB WITHIN 5 DAYS. LOWEST DETECTABLE LIMITS FOR URINE DRUG SCREEN Drug Class                     Cutoff (ng/mL) Amphetamine and metabolites    1000 Barbiturate and metabolites    200 Benzodiazepine  962 Tricyclics and metabolites     300 Opiates and metabolites        300 Cocaine and metabolites        300 THC                            50 Performed at [facility], [address], [place], [place] [address]   Glucose, capillary     Status: Abnormal   Collection Time: 08/08/18  4:22 PM  Result Value Ref Range   Glucose-Capillary 117 (H) 70 - 99 mg/dL  Glucose, capillary     Status: Abnormal   Collection Time: 08/08/18  9:46 PM  Result Value Ref Range   Glucose-Capillary 123 (H) 70 - 99 mg/dL   Comment 1 Notify RN     Comment 2 Document in Chart   Magnesium     Status: None   Collection Time: 08/09/18  6:25 AM  Result Value Ref Range   Magnesium 2.0 1.7 - 2.4 mg/dL    Comment: Performed at [facility], [address] [place], [place] [address]  Hemoglobin A1c     Status: Abnormal   Collection Time: 08/09/18  6:25 AM  Result Value Ref Range   Hgb A1c MFr Bld 6.7 (H) 4.8 - 5.6 %    Comment: (NOTE) Pre diabetes:          5.7%-6.4% Diabetes:              >6.4% Glycemic control for   <7.0% adults with diabetes    Mean Plasma Glucose 145.59 mg/dL    Comment: Performed at [facility] [address] [place], [place] [address]  Lipid panel     Status: Abnormal   Collection Time: 08/09/18  6:25 AM  Result Value Ref Range   Cholesterol 223 (H) 0 - 200 mg/dL   Triglycerides 114 <150 mg/dL   HDL 35 (L) >40 mg/dL   Total CHOL/HDL Ratio 6.4 RATIO   VLDL 23 0 - 40 mg/dL   LDL Cholesterol 165 (H) 0 - 99 mg/dL    Comment:        Total Cholesterol/HDL:CHD Risk Coronary Heart Disease Risk Table                     Men   Women  1/2 Average Risk   3.4   3.3  Average Risk       5.0   4.4  2 X Average Risk   9.6   7.1  3 X Average Risk  23.4   11.0        Use the calculated Patient Ratio above and the CHD Risk Table to determine the patient's CHD Risk.        ATP III CLASSIFICATION (LDL):  <100     mg/dL   Optimal  100-129  mg/dL   Near or Above                    Optimal  130-159  mg/dL   Borderline  160-189  mg/dL   High  >190     mg/dL   Very High Performed at [facility], [address], [place], [place] [address]   Basic metabolic panel     Status: Abnormal   Collection Time: 08/09/18  6:25 AM  Result Value Ref Range   Sodium 135 135 - 145 mmol/L   Potassium 3.6 3.5 - 5.1 mmol/L  Chloride 108 98 - 111 mmol/L   CO2 21 (L) 22 - 32 mmol/L   Glucose, Bld 130 (H) 70 - 99 mg/dL   BUN 6 (L) 8 - 23 mg/dL   Creatinine, Ser 0.66 0.61 - 1.24 mg/dL   Calcium 8.3 (L) 8.9 - 10.3 mg/dL   GFR  calc non Af Amer >60 >60 mL/min   GFR calc Af Amer >60 >60 mL/min   Anion gap 6 5 - 15    Comment: Performed at [facility], [address] [place], [place] [address]  Glucose, capillary     Status: Abnormal   Collection Time: 08/09/18  8:07 AM  Result Value Ref Range   Glucose-Capillary 126 (H) 70 - 99 mg/dL  Glucose, capillary     Status: Abnormal   Collection Time: 08/09/18 11:19 AM  Result Value Ref Range   Glucose-Capillary 172 (H) 70 - 99 mg/dL  Glucose, capillary     Status: Abnormal   Collection Time: 08/09/18  4:27 PM  Result Value Ref Range   Glucose-Capillary 107 (H) 70 - 99 mg/dL    Studies/Results:   BRAIN MRI MRA FINDINGS: MRI HEAD FINDINGS  Brain: Acute infarct in the deep white matter of the left frontal and parietal lobe compatible with watershed infarct.  Mild atrophy and mild chronic microvascular ischemic change in the white matter bilaterally. Negative for hemorrhage or mass. No hydrocephalus.  Vascular: Normal arterial flow voids  Skull and upper cervical spine: Negative  Sinuses/Orbits: Mild mucosal edema paranasal sinuses.  Normal orbit  Other: None  MRA HEAD FINDINGS  Right vertebral dominant and supplies the basilar. Small left vertebral artery ends in PICA. Right PICA patent. Basilar widely patent. Superior cerebellar and posterior cerebral arteries patent bilaterally. Fetal origin of the right posterior cerebral artery.  Internal carotid artery widely patent bilaterally without stenosis. Anterior and middle cerebral arteries patent bilaterally. Apparent stenosis at the proximal right M1 segment probably is due to artifact.  Negative for cerebral aneurysm.  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  There are multiple small  increased signal seen on DWI with corresponding reduced signal on ADC scan indicating acute ischemic stroke.  These involve the left paramedian frontal lobe essentially in a watershed distribution.  There is a small encephalomalacia involving the right frontal region and adjacent to the frontal horn of the lateral ventricle.  There is moderate periventricular increased signal on FLAIR imaging consistent with chronic microvascular changes.  No hemorrhages appreciated.  MRA shows hypoplastic left vertebral and a somewhat reduced signal involving the PCA bilaterally more on the right side.    TTE  1. The left ventricle has normal systolic function of 85-27%. The cavity size was normal. There is borderline increase in left ventricular wall thickness. Echo evidence of impaired diastolic relaxation.  2. No evidence of left ventricular regional wall motion abnormalities.  3. The right ventricle has normal systolic function. The cavity was normal. There is no increase in right ventricular wall thickness. Right ventricular systolic pressure could not be assessed.  4. The aortic valve is tricuspid There is mild aortic annular calcification noted.  5. The mitral valve is normal in structure. There is mild thickening and mild calcification. There is mild mitral annular calcification present.  6. The tricuspid valve is normal  in structure.  7. The aortic root is normal in size and structure.  CAROTID - fine   Ivana Nicastro A. Merlene Solis, M.D.  Diplomate, Tax adviser of Psychiatry and Neurology ( Neurology). 08/09/2018, 6:33 PM

## 2018-08-09 NOTE — Progress Notes (Signed)
PROGRESS NOTE    Miguel Solis  ZSW:[id number] DOB: 1950-08-05 DOA: 08/07/2018 PCP: [name], MD    Brief Narrative:  68 year old male with a long history of alcohol use, presented with right hand and numbness/weakness.  He also had hallucinations and tremors.  He was found to have alcohol withdrawal and was admitted for further treatment.  When his neurologic symptoms did not improve, MRI of the brain was performed that confirmed left-sided watershed infarcts.  Further work-up for stroke is underway.   Assessment & Plan:   Principal Problem:   Alcohol withdrawal hallucinosis (HCC) Active Problems:   Hyperlipemia   Essential hypertension, benign   Hyperglycemia   BPH with urinary obstruction   Hypomagnesemia   Polycythemia   Abnormal EKG   Acute CVA (cerebrovascular accident) ([place])   Right hemiparesis ([place])   1. Acute CVA.  Patient presented with right-sided numbness/weakness.  He also had hallucinations and tremors at that time.  Initially, it was felt that his symptoms were related to alcohol withdrawal, since he reported improvement after receiving Ativan.  The following day, his right-sided weakness had persisted.  MRI imaging of the brain showed left-sided watershed infarct.  MRA head was found to be negative.  Echocardiogram and carotid Dopplers unrevealing.  A1c 6.7 and LDL 165.  Will start on statin.  Started on aspirin.  Will get neurology input. 2. Alcohol withdrawal.  Patient drinks approximately 12 beers per day.  He had not had any alcohol for approximately 2 days prior to admission.  He presented with tremors, hallucinations.  He is currently on CIWA protocol with Ativan.  Overall withdrawal appears to be stable. 3. Hypertension.  Currently using IV metoprolol.  Monitor blood pressure. 4. Hyperlipidemia.  LDL 165.  Restart statin. 5. Dehydration.  Improved with IV fluids.   DVT prophylaxis: Lovenox Code Status: Full code Family Communication: Discussed with  wife at the bedside Disposition Plan: Discharge home once improved   Consultants:   Neurology  Procedures:  Echo: 1. The left ventricle has normal systolic function of 32-20%. The cavity size was normal. There is borderline increase in left ventricular wall thickness. Echo evidence of impaired diastolic relaxation.  2. No evidence of left ventricular regional wall motion abnormalities.  3. The right ventricle has normal systolic function. The cavity was normal. There is no increase in right ventricular wall thickness. Right ventricular systolic pressure could not be assessed.  4. The aortic valve is tricuspid There is mild aortic annular calcification noted.  5. The mitral valve is normal in structure. There is mild thickening and mild calcification. There is mild mitral annular calcification present.  6. The tricuspid valve is normal in structure.   7. The aortic root is normal in size and structure  Antimicrobials:       Subjective: Feeling better today.  Ambulated with physical therapy.  No chest pain.  Objective: Vitals:   08/09/18 1037 08/09/18 1437 08/09/18 1448 08/09/18 1837  BP: (!) 141/83 (!) 143/79 (!) 148/85 138/83  Pulse: 86 89 77 79  Resp: 18 20 19 20   Temp: 98 F (36.7 C) 98.3 F (36.8 C) 98.2 F (36.8 C) 98.5 F (36.9 C)  TempSrc: Oral Oral Oral Oral  SpO2: 98% 96% 97% 98%  Weight:      Height:        Intake/Output Summary (Last 24 hours) at 08/09/2018 2053 Last data filed at 08/09/2018 1800 Gross per 24 hour  Intake 4391.45 ml  Output 3050 ml  Net 1341.45 ml   Filed Weights   08/07/18 1530 08/08/18 0139 08/08/18 0500  Weight: 60.8 kg 81.8 kg 81.8 kg    Examination:  General exam: Appears calm and comfortable  Respiratory system: Clear to auscultation. Respiratory effort normal. Cardiovascular system: S1 & S2 heard, RRR. No JVD, murmurs, rubs, gallops or clicks. No pedal edema. Gastrointestinal system: Abdomen is nondistended, soft and nontender.  No organomegaly or masses felt. Normal bowel sounds heard. Central nervous system: Alert and oriented.  Right lower extremity and upper extremity with 3 to 4/5 strength, 5 out of 5 strength in left upper and lower extremity Extremities: Symmetric  Skin: No rashes, lesions or ulcers Psychiatry: Judgement and insight appear normal. Mood & affect appropriate.     Data Reviewed: I have personally reviewed following labs and imaging studies  CBC: Recent Labs  Lab 08/07/18 1530 08/08/18 0436  WBC 10.8* 7.3  NEUTROABS 6.9 4.0  HGB 19.1* 16.5  HCT 56.8* 49.0  MCV 105.6* 105.6*  PLT 301 595   Basic Metabolic Panel: Recent Labs  Lab 08/07/18 1530 08/07/18 1710 08/08/18 0436 08/09/18 0625  NA 138  --  139 135  K 3.6  --  3.2* 3.6  CL 103  --  110 108  CO2 21*  --  22 21*  GLUCOSE 169*  --  138* 130*  BUN 7*  --  7* 6*  CREATININE 0.80  --  0.67 0.66  CALCIUM 9.2  --  8.1* 8.3*  MG  --  1.4*  --  2.0  PHOS  --  2.6  --   --    GFR: Estimated Creatinine Clearance: 91.3 mL/min (by C-G formula based on SCr of 0.66 mg/dL). Liver Function Tests: Recent Labs  Lab 08/07/18 1530 08/08/18 0436  AST 70* 42*  ALT 42 28  ALKPHOS 109 78  BILITOT 1.1 1.9*  PROT 8.7* 6.6  ALBUMIN 3.9 2.9*   Recent Labs  Lab 08/07/18 1643  LIPASE 45   Recent Labs  Lab 08/07/18 1643  AMMONIA 30   Coagulation Profile: Recent Labs  Lab 08/07/18 1643  INR 1.07   Cardiac Enzymes: Recent Labs  Lab 08/07/18 1530  TROPONINI <0.03   BNP (last 3 results) No results for input(s): PROBNP in the last 8760 hours. HbA1C: Recent Labs    08/08/18 0436 08/09/18 0625  HGBA1C 6.7* 6.7*   CBG: Recent Labs  Lab 08/08/18 1622 08/08/18 2146 08/09/18 0807 08/09/18 1119 08/09/18 1627  GLUCAP 117* 123* 126* 172* 107*   Lipid Profile: Recent Labs    08/09/18 0625  CHOL 223*  HDL 35*  LDLCALC 165*  TRIG 114  CHOLHDL 6.4   Thyroid Function Tests: No results for input(s): TSH, T4TOTAL,  FREET4, T3FREE, THYROIDAB in the last 72 hours. Anemia Panel: No results for input(s): VITAMINB12, FOLATE, FERRITIN, TIBC, IRON, RETICCTPCT in the last 72 hours. Sepsis Labs: No results for input(s): PROCALCITON, LATICACIDVEN in the last 168 hours.  Recent Results (from the past 240 hour(s))  MRSA PCR Screening     Status: Abnormal   Collection Time: 08/08/18  1:23 AM  Result Value Ref Range Status   MRSA by PCR POSITIVE (A) NEGATIVE Final    Comment:        The GeneXpert MRSA Assay (FDA approved for NASAL specimens only), is one component of a comprehensive MRSA colonization surveillance program. It is not intended to diagnose MRSA infection nor to guide or monitor treatment for MRSA infections. RESULT CALLED TO,  READ BACK BY AND VERIFIED WITH: MCDANIEL M. AT 2440 A ON [id number] BY THOMPSON S. Performed at [facility], [address], [place], [place] [address]          Radiology Studies: Mr Virgel Paling KV Contrast  Result Date: 08/08/2018 CLINICAL DATA:  Right-sided numbness 1 day EXAM: MRI HEAD WITHOUT CONTRAST MRA HEAD WITHOUT CONTRAST TECHNIQUE: Multiplanar, multiecho pulse sequences of the brain and surrounding structures were obtained without intravenous contrast. Angiographic images of the head were obtained using MRA technique without contrast. COMPARISON:  CT head 08/07/2018 FINDINGS: MRI HEAD FINDINGS Brain: Acute infarct in the deep white matter of the left frontal and parietal lobe compatible with watershed infarct. Mild atrophy and mild chronic microvascular ischemic change in the white matter bilaterally. Negative for hemorrhage or mass. No hydrocephalus. Vascular: Normal arterial flow voids Skull and upper cervical spine: Negative Sinuses/Orbits: Mild mucosal edema paranasal sinuses.  Normal orbit Other: None MRA HEAD FINDINGS Right vertebral dominant and supplies the basilar. Small left vertebral artery ends in PICA. Right PICA patent. Basilar widely patent. Superior  cerebellar and posterior cerebral arteries patent bilaterally. Fetal origin of the right posterior cerebral artery. Internal carotid artery widely patent bilaterally without stenosis. Anterior and middle cerebral arteries patent bilaterally. Apparent stenosis at the proximal right M1 segment probably is due to artifact. Negative for cerebral aneurysm. IMPRESSION: Acute watershed infarct in the deep white matter of the left cerebral hemisphere Mild atrophy and mild chronic microvascular ischemia Negative MRA head Electronically Signed   By: Franchot Gallo M.D.   On: 08/08/2018 13:15   Mr Brain Wo Contrast  Result Date: 08/08/2018 CLINICAL DATA:  Right-sided numbness 1 day EXAM: MRI HEAD WITHOUT CONTRAST MRA HEAD WITHOUT CONTRAST TECHNIQUE: Multiplanar, multiecho pulse sequences of the brain and surrounding structures were obtained without intravenous contrast. Angiographic images of the head were obtained using MRA technique without contrast. COMPARISON:  CT head 08/07/2018 FINDINGS: MRI HEAD FINDINGS Brain: Acute infarct in the deep white matter of the left frontal and parietal lobe compatible with watershed infarct. Mild atrophy and mild chronic microvascular ischemic change in the white matter bilaterally. Negative for hemorrhage or mass. No hydrocephalus. Vascular: Normal arterial flow voids Skull and upper cervical spine: Negative Sinuses/Orbits: Mild mucosal edema paranasal sinuses.  Normal orbit Other: None MRA HEAD FINDINGS Right vertebral dominant and supplies the basilar. Small left vertebral artery ends in PICA. Right PICA patent. Basilar widely patent. Superior cerebellar and posterior cerebral arteries patent bilaterally. Fetal origin of the right posterior cerebral artery. Internal carotid artery widely patent bilaterally without stenosis. Anterior and middle cerebral arteries patent bilaterally. Apparent stenosis at the proximal right M1 segment probably is due to artifact. Negative for cerebral  aneurysm. IMPRESSION: Acute watershed infarct in the deep white matter of the left cerebral hemisphere Mild atrophy and mild chronic microvascular ischemia Negative MRA head Electronically Signed   By: Franchot Gallo M.D.   On: 08/08/2018 13:15   US Carotid Bilateral (at [facility] And [facility] Only)  Result Date: 08/09/2018 CLINICAL DATA:  68 year old male with a history of stroke. EXAM: BILATERAL CAROTID DUPLEX ULTRASOUND TECHNIQUE: Pearline Cables scale imaging, color Doppler and duplex ultrasound were performed of bilateral carotid and vertebral arteries in the neck. COMPARISON:  No prior duplex FINDINGS: Criteria: Quantification of carotid stenosis is based on velocity parameters that correlate the residual internal carotid diameter with NASCET-based stenosis levels, using the diameter of the distal internal carotid lumen as the denominator for stenosis measurement. The following velocity measurements  were obtained: RIGHT ICA:  Systolic 75 cm/sec, Diastolic 22 cm/sec CCA:  825 cm/sec SYSTOLIC ICA/CCA RATIO:  0.7 ECA:  117 cm/sec LEFT ICA:  Systolic 96 cm/sec, Diastolic 20 cm/sec CCA:  189 cm/sec SYSTOLIC ICA/CCA RATIO:  0.9 ECA:  139 cm/sec Right Brachial SBP: Not acquired Left Brachial SBP: Not acquired RIGHT CAROTID ARTERY: No significant calcifications of the right common carotid artery. Intermediate waveform maintained. Heterogeneous and partially calcified plaque at the right carotid bifurcation. No significant lumen shadowing. Low resistance waveform of the right ICA. No significant tortuosity. RIGHT VERTEBRAL ARTERY: Antegrade flow with low resistance waveform. LEFT CAROTID ARTERY: No significant calcifications of the left common carotid artery. Intermediate waveform maintained. Heterogeneous and partially calcified plaque at the left carotid bifurcation without significant lumen shadowing. Low resistance waveform of the left ICA. No significant tortuosity. LEFT VERTEBRAL ARTERY:  Antegrade flow with low resistance  waveform. IMPRESSION: Color duplex indicates minimal heterogeneous and calcified plaque, with no hemodynamically significant stenosis by duplex criteria in the extracranial cerebrovascular circulation. Signed, Dulcy Fanny. Dellia Nims, RPVI [facility] Electronically Signed   By: Corrie Mckusick D.O.   On: 08/09/2018 14:13        Scheduled Meds: . aspirin  300 mg Rectal Daily   Or  . aspirin  325 mg Oral Daily  . enoxaparin (LOVENOX) injection  40 mg Subcutaneous Q24H  . folic acid  1 mg Oral Daily  . lisinopril  10 mg Oral Daily  . multivitamin with minerals  1 tablet Oral Daily  . thiamine  100 mg Oral Daily   Or  . thiamine  100 mg Intravenous Daily   Continuous Infusions: . 0.45 % NaCl with KCl 20 mEq / L 125 mL/hr at 08/09/18 1614     LOS: 2 days    Time spent: 94mins    Kathie Dike, MD [facility]   If 7PM-7AM, please contact night-coverage [email or web address]  08/09/2018, 8:53 PM

## 2018-08-10 ENCOUNTER — Inpatient Hospital Stay (HOSPITAL_COMMUNITY)
Admit: 2018-08-10 | Discharge: 2018-08-10 | Disposition: A | Discharge status: Home / Self Care | Payer: Medicare Other | Attending: Neurology | Admitting: Neurology

## 2018-08-10 DIAGNOSIS — E785 Hyperlipidemia, unspecified: Secondary | ICD-10-CM

## 2018-08-10 DIAGNOSIS — R739 Hyperglycemia, unspecified: Secondary | ICD-10-CM

## 2018-08-10 LAB — GLUCOSE, CAPILLARY
Glucose-Capillary: 125 mg/dL — ABNORMAL HIGH (ref 70–99)
Glucose-Capillary: 132 mg/dL — ABNORMAL HIGH (ref 70–99)
Glucose-Capillary: 114 mg/dL — ABNORMAL HIGH (ref 70–99)

## 2018-08-10 MED ORDER — ATORVASTATIN CALCIUM 40 MG PO TABS: 40.0000 mg | ORAL_TABLET | Freq: Every day | ORAL | 1 refills | Status: DC

## 2018-08-10 MED ORDER — LISINOPRIL 10 MG PO TABS: 10.0000 mg | ORAL_TABLET | Freq: Every day | ORAL | 1 refills | Status: DC

## 2018-08-10 MED ORDER — ASPIRIN 325 MG PO TABS: 325.0000 mg | ORAL_TABLET | Freq: Every day | ORAL | 0 refills | Status: DC

## 2018-08-10 NOTE — Progress Notes (Signed)
OT Cancellation Note  Patient Details Name: KEMOND AMORIN MRN: [id number] DOB: Nov 23, 1950   Cancelled Treatment:      Patient undergoing procedure, will attempt eval at a later time. Vangie Bicker, [place], OTR/L [phone]  [place] Ped 08/10/2018, 2:44 PM

## 2018-08-10 NOTE — Progress Notes (Signed)
EEG completed, results pending. 

## 2018-08-10 NOTE — Discharge Summary (Signed)
Physician Discharge Summary  Miguel Solis GHW:[id number] DOB: 1950-08-25 DOA: 08/07/2018  PCP: [name], MD  Admit date: 08/07/2018 Discharge date: 08/10/2018  Admitted From: Home Disposition: Home  Recommendations for Outpatient Follow-up:  1. Follow up with PCP in 1-2 weeks 2. Follow-up with neurology in 4 to 6 weeks s:  Home Health: Home health PT Equipment/Devices: Cane  Discharge Condition: Stable CODE STATUS: Full code Diet recommendation: Heart healthy  Brief/Interim Summary: 68 year old male with a long history of alcohol use, presented to the hospital with right-handed numbness/weakness.  He was also having hallucinations and tremors.  His main admitting diagnosis was alcohol withdrawal and he was admitted to the stepdown unit for further supportive treatment.  By the following day, his neurologic symptoms of right-handed weakness and numbness had not improved.  MRI of the brain was performed that confirmed left-sided watershed infarcts.  Further work-up with carotid Dopplers and echocardiogram were unrevealing.  EEG was also ordered that was found to be normal study.  He was followed by neurology throughout hospital course.  Recommendation to start the patient on aspirin as well as statin.  The patient was seen by physical therapy with recommendations for home health therapy and to use a cane.  Patient is feeling significantly better now.  He does not have any signs of alcohol withdrawal at present.  He affirms that he wants to stop drinking any alcohol.  He reports he had tried this in the past through an Alcoholics Anonymous group.  He plans to reconnect with his local Alcoholics Anonymous group in an effort to remain sober.  Patient is otherwise felt stable for discharge today.  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[place]) Active Problems:   Hyperlipemia   Essential hypertension, benign   Hyperglycemia   BPH with urinary obstruction    Hypomagnesemia   Polycythemia   Abnormal EKG   Acute CVA (cerebrovascular accident) ([place])   Right hemiparesis [facility])    Discharge Instructions  Discharge Instructions    Diet - low sodium heart healthy   Complete by:  As directed    Increase activity slowly   Complete by:  As directed      Allergies as of 08/10/2018   No Known Allergies     Medication List    TAKE these medications   aspirin 325 MG tablet Take 1 tablet (325 mg total) by mouth daily. Start taking on:  August 11, 2018   atorvastatin 40 MG tablet Commonly known as:  LIPITOR Take 1 tablet (40 mg total) by mouth daily.   lisinopril 10 MG tablet Commonly known as:  PRINIVIL,ZESTRIL Take 1 tablet (10 mg total) by mouth daily.            Durable Medical Equipment  (From admission, onward)         Start     Ordered   08/09/18 1307  For home use only DME Cane  Once     08/09/18 1306          No Known Allergies  Consultations:  Neurology   Procedures/Studies: Ct Head Wo Contrast  Result Date: 08/07/2018 CLINICAL DATA:  Right hand numbness since 6 a.m. today. Multiple falls due to heavy drinking. EXAM: CT HEAD WITHOUT CONTRAST TECHNIQUE: Contiguous axial images were obtained from the base of the skull through the vertex without intravenous contrast. COMPARISON:  Head CT dated 03/06/2018. FINDINGS: Brain: Stable mildly enlarged ventricles and cortical sulci. Minimal patchy white matter low density  in both cerebral hemispheres. No intracranial hemorrhage, mass lesion or CT evidence of acute infarction. Vascular: No hyperdense vessel or unexpected calcification. Skull: Normal. Negative for fracture or focal lesion. Sinuses/Orbits: Mild bilateral sphenoid, bilateral ethmoid and right maxillary sinus mucosal thickening. Unremarkable orbits. Other: None. IMPRESSION: 1. No acute abnormality. 2. Stable mild atrophy and minimal chronic small vessel white matter ischemic changes in both cerebral hemispheres.  3. Mild chronic sinusitis. Electronically Signed   By: Claudie Revering M.D.   On: 08/07/2018 17:57   Mr Jodene Nam Head Wo Contrast  Result Date: 08/08/2018 CLINICAL DATA:  Right-sided numbness 1 day EXAM: MRI HEAD WITHOUT CONTRAST MRA HEAD WITHOUT CONTRAST TECHNIQUE: Multiplanar, multiecho pulse sequences of the brain and surrounding structures were obtained without intravenous contrast. Angiographic images of the head were obtained using MRA technique without contrast. COMPARISON:  CT head 08/07/2018 FINDINGS: MRI HEAD FINDINGS Brain: Acute infarct in the deep white matter of the left frontal and parietal lobe compatible with watershed infarct. Mild atrophy and mild chronic microvascular ischemic change in the white matter bilaterally. Negative for hemorrhage or mass. No hydrocephalus. Vascular: Normal arterial flow voids Skull and upper cervical spine: Negative Sinuses/Orbits: Mild mucosal edema paranasal sinuses.  Normal orbit Other: None MRA HEAD FINDINGS Right vertebral dominant and supplies the basilar. Small left vertebral artery ends in PICA. Right PICA patent. Basilar widely patent. Superior cerebellar and posterior cerebral arteries patent bilaterally. Fetal origin of the right posterior cerebral artery. Internal carotid artery widely patent bilaterally without stenosis. Anterior and middle cerebral arteries patent bilaterally. Apparent stenosis at the proximal right M1 segment probably is due to artifact. Negative for cerebral aneurysm. IMPRESSION: Acute watershed infarct in the deep white matter of the left cerebral hemisphere Mild atrophy and mild chronic microvascular ischemia Negative MRA head Electronically Signed   By: Franchot Gallo M.D.   On: 08/08/2018 13:15   Mr Brain Wo Contrast  Result Date: 08/08/2018 CLINICAL DATA:  Right-sided numbness 1 day EXAM: MRI HEAD WITHOUT CONTRAST MRA HEAD WITHOUT CONTRAST TECHNIQUE: Multiplanar, multiecho pulse sequences of the brain and surrounding structures were  obtained without intravenous contrast. Angiographic images of the head were obtained using MRA technique without contrast. COMPARISON:  CT head 08/07/2018 FINDINGS: MRI HEAD FINDINGS Brain: Acute infarct in the deep white matter of the left frontal and parietal lobe compatible with watershed infarct. Mild atrophy and mild chronic microvascular ischemic change in the white matter bilaterally. Negative for hemorrhage or mass. No hydrocephalus. Vascular: Normal arterial flow voids Skull and upper cervical spine: Negative Sinuses/Orbits: Mild mucosal edema paranasal sinuses.  Normal orbit Other: None MRA HEAD FINDINGS Right vertebral dominant and supplies the basilar. Small left vertebral artery ends in PICA. Right PICA patent. Basilar widely patent. Superior cerebellar and posterior cerebral arteries patent bilaterally. Fetal origin of the right posterior cerebral artery. Internal carotid artery widely patent bilaterally without stenosis. Anterior and middle cerebral arteries patent bilaterally. Apparent stenosis at the proximal right M1 segment probably is due to artifact. Negative for cerebral aneurysm. IMPRESSION: Acute watershed infarct in the deep white matter of the left cerebral hemisphere Mild atrophy and mild chronic microvascular ischemia Negative MRA head Electronically Signed   By: Franchot Gallo M.D.   On: 08/08/2018 13:15   US Carotid Bilateral (at [facility] Only)  Result Date: 08/09/2018 CLINICAL DATA:  68 year old male with a history of stroke. EXAM: BILATERAL CAROTID DUPLEX ULTRASOUND TECHNIQUE: Pearline Cables scale imaging, color Doppler and duplex ultrasound were performed of bilateral carotid and vertebral  arteries in the neck. COMPARISON:  No prior duplex FINDINGS: Criteria: Quantification of carotid stenosis is based on velocity parameters that correlate the residual internal carotid diameter with NASCET-based stenosis levels, using the diameter of the distal internal carotid lumen as the denominator  for stenosis measurement. The following velocity measurements were obtained: RIGHT ICA:  Systolic 75 cm/sec, Diastolic 22 cm/sec CCA:  786 cm/sec SYSTOLIC ICA/CCA RATIO:  0.7 ECA:  117 cm/sec LEFT ICA:  Systolic 96 cm/sec, Diastolic 20 cm/sec CCA:  767 cm/sec SYSTOLIC ICA/CCA RATIO:  0.9 ECA:  139 cm/sec Right Brachial SBP: Not acquired Left Brachial SBP: Not acquired RIGHT CAROTID ARTERY: No significant calcifications of the right common carotid artery. Intermediate waveform maintained. Heterogeneous and partially calcified plaque at the right carotid bifurcation. No significant lumen shadowing. Low resistance waveform of the right ICA. No significant tortuosity. RIGHT VERTEBRAL ARTERY: Antegrade flow with low resistance waveform. LEFT CAROTID ARTERY: No significant calcifications of the left common carotid artery. Intermediate waveform maintained. Heterogeneous and partially calcified plaque at the left carotid bifurcation without significant lumen shadowing. Low resistance waveform of the left ICA. No significant tortuosity. LEFT VERTEBRAL ARTERY:  Antegrade flow with low resistance waveform. IMPRESSION: Color duplex indicates minimal heterogeneous and calcified plaque, with no hemodynamically significant stenosis by duplex criteria in the extracranial cerebrovascular circulation. Signed, Dulcy Fanny. Dellia Nims, RPVI [facility] Electronically Signed   By: Corrie Mckusick D.O.   On: 08/09/2018 14:13       Subjective: Feeling better today.  No complaints of weakness.  No shortness of breath or chest pain.  Discharge Exam: Vitals:   08/10/18 0637 08/10/18 1037 08/10/18 1348 08/10/18 1437  BP: (!) 151/92 138/83 (!) 142/88 136/85  Pulse: 74 82 82 86  Resp: 20 19 18 19   Temp: 98 F (36.7 C) 98.1 F (36.7 C) 98.6 F (37 C) 98.3 F (36.8 C)  TempSrc: Oral Oral  Oral  SpO2: 98% 98% 97% 98%  Weight:      Height:        General: Pt is alert,  awake, not in acute distress Cardiovascular: RRR, S1/S2 +, no rubs, no gallops Respiratory: CTA bilaterally, no wheezing, no rhonchi Abdominal: Soft, NT, ND, bowel sounds + Extremities: no edema, no cyanosis    The results of significant diagnostics from this hospitalization (including imaging, microbiology, ancillary and laboratory) are listed below for reference.     Microbiology: Recent Results (from the past 240 hour(s))  MRSA PCR Screening     Status: Abnormal   Collection Time: 08/08/18  1:23 AM  Result Value Ref Range Status   MRSA by PCR POSITIVE (A) NEGATIVE Final    Comment:        The GeneXpert MRSA Assay (FDA approved for NASAL specimens only), is one component of a comprehensive MRSA colonization surveillance program. It is not intended to diagnose MRSA infection nor to guide or monitor treatment for MRSA infections. RESULT CALLED TO, READ BACK BY AND VERIFIED WITH: MCDANIEL M. AT 2094 A ON [id number] BY THOMPSON S. Performed at [facility], [address] [place], [place] [address]      Labs: BNP (last 3 results) No results for input(s): BNP in the last 8760 hours. Basic Metabolic Panel: Recent Labs  Lab 08/07/18 1530 08/07/18 1710 08/08/18 0436 08/09/18 0625  NA 138  --  139 135  K 3.6  --  3.2* 3.6  CL 103  --  110 108  CO2 21*  --  22 21*  GLUCOSE 169*  --  138* 130*  BUN 7*  --  7* 6*  CREATININE 0.80  --  0.67 0.66  CALCIUM 9.2  --  8.1* 8.3*  MG  --  1.4*  --  2.0  PHOS  --  2.6  --   --    Liver Function Tests: Recent Labs  Lab 08/07/18 1530 08/08/18 0436  AST 70* 42*  ALT 42 28  ALKPHOS 109 78  BILITOT 1.1 1.9*  PROT 8.7* 6.6  ALBUMIN 3.9 2.9*   Recent Labs  Lab 08/07/18 1643  LIPASE 45   Recent Labs  Lab 08/07/18 1643  AMMONIA 30   CBC: Recent Labs  Lab 08/07/18 1530 08/08/18 0436  WBC 10.8* 7.3  NEUTROABS 6.9 4.0  HGB 19.1* 16.5  HCT 56.8* 49.0  MCV 105.6* 105.6*  PLT 301 235   Cardiac Enzymes: Recent Labs   Lab 08/07/18 1530  TROPONINI <0.03   BNP: Invalid input(s): POCBNP CBG: Recent Labs  Lab 08/09/18 1627 08/09/18 2238 08/10/18 0736 08/10/18 1122 08/10/18 1607  GLUCAP 107* 134* 125* 132* 114*   D-Dimer No results for input(s): DDIMER in the last 72 hours. Hgb A1c Recent Labs    08/08/18 0436 08/09/18 0625  HGBA1C 6.7* 6.7*   Lipid Profile Recent Labs    08/09/18 0625  CHOL 223*  HDL 35*  LDLCALC 165*  TRIG 114  CHOLHDL 6.4   Thyroid function studies No results for input(s): TSH, T4TOTAL, T3FREE, THYROIDAB in the last 72 hours.  Invalid input(s): FREET3 Anemia work up No results for input(s): VITAMINB12, FOLATE, FERRITIN, TIBC, IRON, RETICCTPCT in the last 72 hours. Urinalysis    Component Value Date/Time   COLORURINE AMBER (A) 04/13/2018 1709   APPEARANCEUR TURBID (A) 04/13/2018 1709   LABSPEC 1.012 04/13/2018 1709   PHURINE 8.0 04/13/2018 1709   GLUCOSEU NEGATIVE 04/13/2018 1709   HGBUR SMALL (A) 04/13/2018 1709   BILIRUBINUR NEGATIVE 04/13/2018 1709   KETONESUR NEGATIVE 04/13/2018 1709   PROTEINUR 100 (A) 04/13/2018 1709   NITRITE POSITIVE (A) 04/13/2018 1709   LEUKOCYTESUR LARGE (A) 04/13/2018 1709   Sepsis Labs Invalid input(s): PROCALCITONIN,  WBC,  LACTICIDVEN Microbiology Recent Results (from the past 240 hour(s))  MRSA PCR Screening     Status: Abnormal   Collection Time: 08/08/18  1:23 AM  Result Value Ref Range Status   MRSA by PCR POSITIVE (A) NEGATIVE Final    Comment:        The GeneXpert MRSA Assay (FDA approved for NASAL specimens only), is one component of a comprehensive MRSA colonization surveillance program. It is not intended to diagnose MRSA infection nor to guide or monitor treatment for MRSA infections. RESULT CALLED TO, READ BACK BY AND VERIFIED WITH: MCDANIEL M. AT 1093 A ON [id number] BY THOMPSON S. Performed at [facility], [address] [place], [place] [address]      Time coordinating discharge:  48mins  SIGNED:   Kathie Dike, MD  [facility] 08/10/2018, 6:03 PM   If 7PM-7AM, please contact night-coverage [email or web address]

## 2018-08-10 NOTE — Progress Notes (Addendum)
Physical Therapy Treatment Patient Details Name: Miguel Solis MRN: [id number] DOB: 12/10/1950 Today's Date: 08/10/2018    History of Present Illness Miguel Solis is a 68 y.o. male with medical history significant of colon polyps, gout, hyperlipidemia, hypertension, glucose intolerance, alcohol dependence who is coming to the emergency department with complaints of intermittent right hand numbness since 6 in the morning, but also noticed to have tremors, hallucinosis (states he is seeing things like bugs), tremors and is stating that he has inability to control his body from shaking.  Family member stated that he is heavy drinker in the emergency department.  When seen in the ED, the patient does open his eyes briefly.  However, once I saw him in the ICU, he woke up and although somnolent, was able to answer some questions.  He stated he has not had anything to drink in the past 2 days.  He normally drinks about a 12 pack of beers daily.  That was similar to the amount that was provided by family members to the ICU nursing staff.  He denies having any pain or any other symptoms that he could remember.  However, the patient was still mildly sedated with lorazepam.    PT Comments    Patient demonstrates improvement for going up/down stairs in stairwell with step to pattern using SPC and 1 side rail, requires more supervision when coming down due to slightly unsteadiness.  Patient ambulated up/down ramps without loss of balance with good return for using SPC.  Patient will benefit from continued physical therapy in hospital and recommended venue below to increase strength, balance, endurance for safe ADLs and gait.  Patient's spouse/family encouraged to ambulate patient while in hospital - RN notified.    Follow Up Recommendations  Home health PT;Supervision - Intermittent     Equipment Recommendations  Cane    Recommendations for Other Services       Precautions / Restrictions  Precautions Precautions: Fall Restrictions Weight Bearing Restrictions: No    Mobility  Bed Mobility Overal bed mobility: Modified Independent             General bed mobility comments: increased time  Transfers Overall transfer level: Modified independent Equipment used: Straight cane             General transfer comment: good return for use of [facility]  Ambulation/Gait Ambulation/Gait assistance: Modified independent (Device/Increase time);Supervision Gait Distance (Feet): 150 Feet Assistive device: Straight cane Gait Pattern/deviations: Step-through pattern;Decreased step length - left;Decreased stride length Gait velocity: decreased   General Gait Details: good return for ambulation on level, inclined and declined surfaces without loss of balance with mostly 2 point gait pattern using SPC   Stairs Stairs: Yes Stairs assistance: Supervision Stair Management: One rail Right;One rail Left;Step to pattern;With cane Number of Stairs: 9 General stair comments: good return for going up/down stairs using SPC and 1 siderail with step to pattern without loss of balance   Wheelchair Mobility    Modified Rankin (Stroke Patients Only)       Balance Overall balance assessment: Mild deficits observed, not formally tested                                          Cognition Arousal/Alertness: Awake/alert Behavior During Therapy: WFL for tasks assessed/performed Overall Cognitive Status: Within Functional Limits for tasks assessed  Exercises General Exercises - Lower Extremity Long Arc Quad: Seated;Strengthening;AROM;Both;10 reps Hip Flexion/Marching: Seated;Strengthening;AROM;Both;10 reps Toe Raises: Seated;Strengthening;AROM;Both;10 reps Heel Raises: Seated;AROM;Strengthening;Both;10 reps    General Comments        Pertinent Vitals/Pain Pain Assessment: Faces Faces Pain Scale: Hurts a  little bit Pain Location: right knee Pain Descriptors / Indicators: Discomfort Pain Intervention(s): Limited activity within patient's tolerance;Monitored during session    Home Living                      Prior Function            PT Goals (current goals can now be found in the care plan section) Acute Rehab PT Goals Patient Stated Goal: return home with spouse to assist PT Goal Formulation: With patient/family Time For Goal Achievement: 08/11/18 Potential to Achieve Goals: Good Progress towards PT goals: Progressing toward goals    Frequency    7X/week      PT Plan Current plan remains appropriate    Co-evaluation              AM-PAC PT "6 Clicks" Mobility   Outcome Measure  Help needed turning from your back to your side while in a flat bed without using bedrails?: None Help needed moving from lying on your back to sitting on the side of a flat bed without using bedrails?: None Help needed moving to and from a bed to a chair (including a wheelchair)?: None Help needed standing up from a chair using your arms (e.g., wheelchair or bedside chair)?: None Help needed to walk in hospital room?: None Help needed climbing 3-5 steps with a railing? : A Little 6 Click Score: 23    End of Session   Activity Tolerance: Patient tolerated treatment well Patient left: in bed;with family/visitor present Nurse Communication: Mobility status PT Visit Diagnosis: Unsteadiness on feet (R26.81);Other abnormalities of gait and mobility (R26.89);Muscle weakness (generalized) (M62.81)     Time: 6701-1003 PT Time Calculation (min) (ACUTE ONLY): 28 min  Charges:  $Gait Training: 8-22 mins $Therapeutic Exercise: 8-22 mins                     3:59 PM, 08/10/18 Lonell Grandchild, MPT Physical Therapist with [facility] [phone] office [phone] mobile phone

## 2018-08-10 NOTE — Procedures (Signed)
  [place] A. Merlene Laughter, MD     [email or web address]           HISTORY: The patient is 68 year old who presents with somnolence, jerking activity and hallucinations.  The studies been done to evaluate for seizures as a potential etiology.  MEDICATIONS: Scheduled Meds: . aspirin  300 mg Rectal Daily   Or  . aspirin  325 mg Oral Daily  . atorvastatin  40 mg Oral q1800  . enoxaparin (LOVENOX) injection  40 mg Subcutaneous Q24H  . folic acid  1 mg Oral Daily  . lisinopril  10 mg Oral Daily  . multivitamin with minerals  1 tablet Oral Daily  . thiamine  100 mg Oral Daily   Or  . thiamine  100 mg Intravenous Daily   Continuous Infusions: . 0.45 % NaCl with KCl 20 mEq / L 125 mL/hr at 08/10/18 0049   PRN Meds:.LORazepam, metoprolol tartrate, ondansetron **OR** ondansetron (ZOFRAN) IV     ANALYSIS: A 16 channel recording using standard 10 20 measurements is conducted for 22 minutes.  There is a well-formed posterior dominant rhythm of 9.5-10 Hz which attenuates with eye opening.  There is beta activity observed in frontal areas.  Awake and drowsy activities are observed.  Photic stimulation and hyperventilation are not conducted.  There is no focal or lateralized slowing.  There is no epileptiform activity is observed.   IMPRESSION: This is a normal recording of the awake and drowsy states.      Samaiyah Howes A. Merlene Laughter, M.D.  Diplomate, Tax adviser of Psychiatry and Neurology ( Neurology).

## 2018-08-10 NOTE — Progress Notes (Signed)
SLP Cancellation Note  Patient Details Name: Miguel Solis MRN: [id number] DOB: 1951-06-08   Cancelled treatment:       Reason Eval/Treat Not Completed: SLP screened, no needs identified, will sign off. Pt is at cognitive-linguistic baseline. Thank you for this referral,  Sang Blount H. Roddie Mc, [place] Speech Language Pathologist    Wende Bushy 08/10/2018, 4:48 PM

## 2018-08-10 NOTE — Progress Notes (Signed)
Removed IV-clean, dry, intact. Reviewed d/c paperwork. Answered all questions and reviewed new meds. NT wheeled stable patient and belongings to main entrance where he was picked up by his wife. To d/c to home.

## 2018-08-16 DIAGNOSIS — N138 Other obstructive and reflux uropathy: Secondary | ICD-10-CM | POA: Diagnosis not present

## 2018-08-16 DIAGNOSIS — I69351 Hemiplegia and hemiparesis following cerebral infarction affecting right dominant side: Secondary | ICD-10-CM | POA: Diagnosis not present

## 2018-08-16 DIAGNOSIS — I10 Essential (primary) hypertension: Secondary | ICD-10-CM | POA: Diagnosis not present

## 2018-08-16 DIAGNOSIS — Z9181 History of falling: Secondary | ICD-10-CM | POA: Diagnosis not present

## 2018-08-16 DIAGNOSIS — F10239 Alcohol dependence with withdrawal, unspecified: Secondary | ICD-10-CM | POA: Diagnosis not present

## 2018-08-16 DIAGNOSIS — R338 Other retention of urine: Secondary | ICD-10-CM | POA: Diagnosis not present

## 2018-08-16 DIAGNOSIS — Z7982 Long term (current) use of aspirin: Secondary | ICD-10-CM | POA: Diagnosis not present

## 2018-08-16 DIAGNOSIS — E785 Hyperlipidemia, unspecified: Secondary | ICD-10-CM | POA: Diagnosis not present

## 2018-08-16 DIAGNOSIS — F10251 Alcohol dependence with alcohol-induced psychotic disorder with hallucinations: Secondary | ICD-10-CM | POA: Diagnosis not present

## 2018-08-16 DIAGNOSIS — N401 Enlarged prostate with lower urinary tract symptoms: Secondary | ICD-10-CM | POA: Diagnosis not present

## 2018-08-16 DIAGNOSIS — M109 Gout, unspecified: Secondary | ICD-10-CM | POA: Diagnosis not present

## 2018-08-16 DIAGNOSIS — D751 Secondary polycythemia: Secondary | ICD-10-CM | POA: Diagnosis not present

## 2018-08-17 ENCOUNTER — Telehealth: Payer: Self-pay | Admitting: Family Medicine

## 2018-08-17 DIAGNOSIS — R200 Anesthesia of skin: Secondary | ICD-10-CM

## 2018-08-17 NOTE — Telephone Encounter (Signed)
Pt was in hospital on 08/07/2018 for alcohol withdrawal. ER notes states to follow up with Dr. Merlene Laughter in 4-6 weeks. Please advise. Thank you

## 2018-08-17 NOTE — Telephone Encounter (Signed)
Pt left message on my voicemail requesting referral to Dr. Merlene Laughter - no reason given  Please advise & if "ok" to refer - please initiate referral in system

## 2018-08-19 NOTE — Telephone Encounter (Signed)
ok 

## 2018-08-20 NOTE — Telephone Encounter (Signed)
Referral put in.

## 2018-08-21 ENCOUNTER — Telehealth: Payer: Self-pay | Admitting: Family Medicine

## 2018-08-21 NOTE — Telephone Encounter (Signed)
Pt.notified

## 2018-08-21 NOTE — Telephone Encounter (Signed)
[facility] requesting Physical therapy order   1 week one  2 week 3

## 2018-08-21 NOTE — Telephone Encounter (Signed)
sure

## 2018-08-21 NOTE — Telephone Encounter (Signed)
Verbal order given to [facility] with [facility]

## 2018-08-25 DIAGNOSIS — F10239 Alcohol dependence with withdrawal, unspecified: Secondary | ICD-10-CM | POA: Diagnosis not present

## 2018-08-25 DIAGNOSIS — N138 Other obstructive and reflux uropathy: Secondary | ICD-10-CM

## 2018-08-25 DIAGNOSIS — I69351 Hemiplegia and hemiparesis following cerebral infarction affecting right dominant side: Secondary | ICD-10-CM | POA: Diagnosis not present

## 2018-08-25 DIAGNOSIS — N401 Enlarged prostate with lower urinary tract symptoms: Secondary | ICD-10-CM

## 2018-08-25 DIAGNOSIS — R338 Other retention of urine: Secondary | ICD-10-CM

## 2018-08-25 DIAGNOSIS — I10 Essential (primary) hypertension: Secondary | ICD-10-CM | POA: Diagnosis not present

## 2018-08-25 DIAGNOSIS — F10251 Alcohol dependence with alcohol-induced psychotic disorder with hallucinations: Secondary | ICD-10-CM | POA: Diagnosis not present

## 2018-08-27 ENCOUNTER — Encounter: Payer: Self-pay | Admitting: Family Medicine

## 2018-08-27 ENCOUNTER — Ambulatory Visit (INDEPENDENT_AMBULATORY_CARE_PROVIDER_SITE_OTHER): Payer: Medicare Other | Admitting: Family Medicine

## 2018-08-27 VITALS — BP 118/74 | Ht 70.0 in | Wt 183.8 lb

## 2018-08-27 DIAGNOSIS — I639 Cerebral infarction, unspecified: Secondary | ICD-10-CM | POA: Diagnosis not present

## 2018-08-27 DIAGNOSIS — G8191 Hemiplegia, unspecified affecting right dominant side: Secondary | ICD-10-CM | POA: Diagnosis not present

## 2018-08-27 MED ORDER — ASPIRIN 325 MG PO TABS: 325.0000 mg | ORAL_TABLET | Freq: Every day | ORAL | 0 refills | Status: DC

## 2018-08-27 NOTE — Progress Notes (Signed)
   Subjective:    Patient ID: Miguel Solis, male    DOB: 1951-01-03, 68 y.o.   MRN: [id number] Appropriate contact was made within 48 hours after discharge from the hospital. HPI Patient arrives for a follow up o a recent hospitalization for CVA. Patient has had referral sent to Dr Merlene Laughter to follow up on CVA.   Patient arrives office for follow-up  Still having some weakness on his right side.  Using his cane although his wife reports not as much as he should.  Receiving physical therapy and home.  Claims to have not drink any further alcohol since discharge from the hospital.  Complete hospital records discharge summary imaging and blood work and consult reports reviewed today in presence of family.  MRI unfortunately did reveal a true left-sided stroke.  Full dose aspirin plus increased level of Lipitor meds initiated.  Patient claims compliance  Due to see neurologist in a couple weeks  Review of Systems No headache, no major weight loss or weight gain, no chest pain no back pain abdominal pain no change in bowel habits complete ROS otherwise negative     Objective:   Physical Exam  Alert and oriented, vitals reviewed and stable, NAD somewhat disheveled appearance ENT-TM's and ext canals WNL bilat via otoscopic exam Soft palate, tonsils and post pharynx WNL via oropharyngeal exam Neck-symmetric, no masses; thyroid nonpalpable and nontender Pulmonary-no tachypnea or accessory muscle use; Clear without wheezes via auscultation Card--no abnrml murmurs, rhythm reg and rate WNL Carotid pulses symmetric, without bruits  Facial muscles right arm and right leg noticeably weakened.  Utilizing cane for mobilization.  Speech clear.  Occasional slight cloudiness and responsiveness.  Of note patient not driving     Assessment & Plan:  Impression status post cerebrovascular accident.  On higher dose of Lipitor now.  Compliant with blood pressure medication.  Does not smoke.  Claims he  is not going back to alcohol.  School therapy discussed.  Diet discussed.  Exercise discussed compliance with meds discussed.  Follow-up in several months

## 2018-09-05 DIAGNOSIS — E7849 Other hyperlipidemia: Secondary | ICD-10-CM | POA: Diagnosis not present

## 2018-09-05 DIAGNOSIS — F1026 Alcohol dependence with alcohol-induced persisting amnestic disorder: Secondary | ICD-10-CM | POA: Diagnosis not present

## 2018-09-05 DIAGNOSIS — I10 Essential (primary) hypertension: Secondary | ICD-10-CM | POA: Diagnosis not present

## 2018-09-05 DIAGNOSIS — I69352 Hemiplegia and hemiparesis following cerebral infarction affecting left dominant side: Secondary | ICD-10-CM | POA: Diagnosis not present

## 2018-09-20 ENCOUNTER — Telehealth: Payer: Self-pay | Admitting: Family Medicine

## 2018-09-20 ENCOUNTER — Emergency Department (HOSPITAL_COMMUNITY)
Admission: EM | Admit: 2018-09-20 | Discharge: 2018-09-20 | Disposition: A | Discharge status: Home / Self Care | Payer: Medicare Other | Attending: Emergency Medicine | Admitting: Emergency Medicine

## 2018-09-20 ENCOUNTER — Emergency Department (HOSPITAL_COMMUNITY): Payer: Medicare Other

## 2018-09-20 ENCOUNTER — Encounter (HOSPITAL_COMMUNITY): Payer: Self-pay | Admitting: Emergency Medicine

## 2018-09-20 ENCOUNTER — Other Ambulatory Visit: Payer: Self-pay

## 2018-09-20 DIAGNOSIS — G459 Transient cerebral ischemic attack, unspecified: Secondary | ICD-10-CM | POA: Diagnosis not present

## 2018-09-20 DIAGNOSIS — F10120 Alcohol abuse with intoxication, uncomplicated: Secondary | ICD-10-CM | POA: Insufficient documentation

## 2018-09-20 DIAGNOSIS — R29810 Facial weakness: Secondary | ICD-10-CM | POA: Diagnosis not present

## 2018-09-20 DIAGNOSIS — I639 Cerebral infarction, unspecified: Secondary | ICD-10-CM | POA: Diagnosis not present

## 2018-09-20 DIAGNOSIS — R531 Weakness: Secondary | ICD-10-CM | POA: Diagnosis not present

## 2018-09-20 DIAGNOSIS — Z79899 Other long term (current) drug therapy: Secondary | ICD-10-CM | POA: Diagnosis not present

## 2018-09-20 DIAGNOSIS — Z7982 Long term (current) use of aspirin: Secondary | ICD-10-CM | POA: Insufficient documentation

## 2018-09-20 DIAGNOSIS — I10 Essential (primary) hypertension: Secondary | ICD-10-CM | POA: Diagnosis not present

## 2018-09-20 DIAGNOSIS — R410 Disorientation, unspecified: Secondary | ICD-10-CM | POA: Diagnosis not present

## 2018-09-20 DIAGNOSIS — R404 Transient alteration of awareness: Secondary | ICD-10-CM | POA: Diagnosis not present

## 2018-09-20 DIAGNOSIS — F10929 Alcohol use, unspecified with intoxication, unspecified: Secondary | ICD-10-CM

## 2018-09-20 DIAGNOSIS — R52 Pain, unspecified: Secondary | ICD-10-CM | POA: Diagnosis not present

## 2018-09-20 LAB — COMPREHENSIVE METABOLIC PANEL
ALBUMIN: 3.5 g/dL (ref 3.5–5.0)
ALT: 16 U/L (ref 0–44)
AST: 22 U/L (ref 15–41)
Alkaline Phosphatase: 78 U/L (ref 38–126)
Anion gap: 11 (ref 5–15)
BUN: 11 mg/dL (ref 8–23)
CALCIUM: 8.8 mg/dL — AB (ref 8.9–10.3)
CO2: 21 mmol/L — AB (ref 22–32)
Chloride: 102 mmol/L (ref 98–111)
Creatinine, Ser: 0.88 mg/dL (ref 0.61–1.24)
GFR calc Af Amer: >60 mL/min (ref >60)
GFR calc non Af Amer: >60 mL/min (ref >60)
Glucose, Bld: 204 mg/dL — ABNORMAL HIGH (ref 70–99)
Potassium: 3.2 mmol/L — ABNORMAL LOW (ref 3.5–5.1)
SODIUM: 134 mmol/L — AB (ref 135–145)
TOTAL PROTEIN: 7.1 g/dL (ref 6.5–8.1)
Total Bilirubin: 0.8 mg/dL (ref 0.3–1.2)

## 2018-09-20 LAB — URINALYSIS, ROUTINE W REFLEX MICROSCOPIC
Bilirubin Urine: NEGATIVE
Glucose, UA: 50 mg/dL — AB
Hgb urine dipstick: NEGATIVE
Ketones, ur: NEGATIVE mg/dL
Leukocytes,Ua: NEGATIVE
Nitrite: NEGATIVE
Protein, ur: NEGATIVE mg/dL
SPECIFIC GRAVITY, URINE: 1.002 — AB (ref 1.005–1.030)
pH: 6 (ref 5.0–8.0)

## 2018-09-20 LAB — CBC
HCT: 45.7 % (ref 39.0–52.0)
Hemoglobin: 15.5 g/dL (ref 13.0–17.0)
MCH: 34.4 pg — ABNORMAL HIGH (ref 26.0–34.0)
MCHC: 33.9 g/dL (ref 30.0–36.0)
MCV: 101.6 fL — ABNORMAL HIGH (ref 80.0–100.0)
Platelets: 227 10*3/uL (ref 150–400)
RBC: 4.5 MIL/uL (ref 4.22–5.81)
RDW: 11.5 % (ref 11.5–15.5)
WBC: 9.5 10*3/uL (ref 4.0–10.5)
nRBC: 0 % (ref 0.0–0.2)

## 2018-09-20 LAB — DIFFERENTIAL
Abs Immature Granulocytes: 0.02 10*3/uL (ref 0.00–0.07)
Basophils Absolute: 0.1 10*3/uL (ref 0.0–0.1)
Basophils Relative: 1 %
Eosinophils Absolute: 0.7 10*3/uL — ABNORMAL HIGH (ref 0.0–0.5)
Eosinophils Relative: 8 %
IMMATURE GRANULOCYTES: 0 %
LYMPHS ABS: 3.7 10*3/uL (ref 0.7–4.0)
Lymphocytes Relative: 39 %
Monocytes Absolute: 0.7 10*3/uL (ref 0.1–1.0)
Monocytes Relative: 8 %
Neutro Abs: 4.3 10*3/uL (ref 1.7–7.7)
Neutrophils Relative %: 44 %

## 2018-09-20 LAB — PROTIME-INR
INR: 1 (ref 0.8–1.2)
Prothrombin Time: 13.5 seconds (ref 11.4–15.2)

## 2018-09-20 LAB — RAPID URINE DRUG SCREEN, HOSP PERFORMED
Amphetamines: NOT DETECTED
BARBITURATES: NOT DETECTED
Benzodiazepines: NOT DETECTED
Cocaine: NOT DETECTED
Opiates: NOT DETECTED
Tetrahydrocannabinol: NOT DETECTED

## 2018-09-20 LAB — APTT: aPTT: 37 seconds — ABNORMAL HIGH (ref 24–36)

## 2018-09-20 LAB — I-STAT CREATININE, ED: Creatinine, Ser: 1.1 mg/dL (ref 0.61–1.24)

## 2018-09-20 LAB — ETHANOL: Alcohol, Ethyl (B): 304 mg/dL (ref <10)

## 2018-09-20 NOTE — Telephone Encounter (Signed)
Appears to be having a stroke I highly recommend going to ER utilize 911 if unable to quickly get him there

## 2018-09-20 NOTE — ED Notes (Signed)
Code stroke paged out.

## 2018-09-20 NOTE — Discharge Instructions (Addendum)
Extensive work-up to include CT had MRI brain MRA brain without evidence of any new stroke.  Make an appointment follow-up with your doctor.  Your alcohol level was significantly elevated here in the 300 range.  Return for any new or worse symptoms.

## 2018-09-20 NOTE — ED Notes (Signed)
Date and time results received: 09/20/18 1807 (use smartphrase ".now" to insert current time)  Test: ETOH Critical Value: 304  Name of Provider Notified: Dr. Rogene Houston MD  Orders Received? Or Actions Taken?: n/a

## 2018-09-20 NOTE — Telephone Encounter (Signed)
Daughter stated patient is heading to ER via ambulance at this time

## 2018-09-20 NOTE — ED Notes (Signed)
Phlebotomy at bedside.

## 2018-09-20 NOTE — Telephone Encounter (Signed)
Pt daughter called office stating that patient is having right sided weakness, mouth drawing up and pt is drooling, right eye will not stay open and patient is disoriented. Pt daughter states the family began noticing symptoms about noon today. Pt daughter states that she was not sure if she needed to go to ER with everything going on. Daughter advised to take patient to ER with those symptoms or to call 911. Pt daughter verbalized understanding.

## 2018-09-20 NOTE — ED Notes (Signed)
Pt returned from MRI °

## 2018-09-20 NOTE — ED Provider Notes (Addendum)
[facility] Provider Note   CSN: [id number] Arrival date & time: 09/20/18  1630    History   Chief Complaint Chief Complaint  Patient presents with  . Weakness    HPI Miguel Solis is a 68 y.o. male.     Patient brought in by EMS.  Patient had a previous stroke about a month ago.  Because some right-sided deficits.  Per EMS family reported patient woke up at baseline this morning and around noon patient became confused EMS reported that when they arrived patient had right-sided weakness and drift and right-sided facial droop and was confused.  During transport patient became more alert and was oriented to person and time.  His blood sugar was 225.  Patient brought in as a potential code stroke.     Past Medical History:  Diagnosis Date  . Colon polyp 2008  . Foley catheter in place   . Gout   . Hyperlipemia   . Hyperlipidemia   . Hypertension   . Impaired fasting glucose   . Urinary retention     Patient Active Problem List   Diagnosis Date Noted  . Acute CVA (cerebrovascular accident) ([place]) 08/08/2018  . Right hemiparesis ([place]) 08/08/2018  . 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 Hypomagnesemia 08/07/2018  . Polycythemia 08/07/2018  . Abnormal EKG 08/07/2018  . 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 BPH with urinary obstruction 05/04/2018  . Alcohol abuse 04/03/2017  . Prostate hypertrophy 01/10/2013  . Elevated PSA 12/05/2012  . Hyperlipemia 11/08/2012  . Essential hypertension, benign 11/08/2012  . Hyperglycemia 11/08/2012    Past Surgical History:  Procedure Laterality Date  . COLONOSCOPY N/A 08/18/2017   Procedure: COLONOSCOPY;  Surgeon: Daneil Dolin, MD;  Location: [facility];  Service: Endoscopy;  Laterality: N/A;  10:30  . KNEE SURGERY Left yrs ago   torn meniscus  . POLYPECTOMY  08/18/2017   Procedure: POLYPECTOMY;  Surgeon: Daneil Dolin, MD;  Location: [facility];  Service:  Endoscopy;;  cecal x2 (cold snare)and ascending colon x2 (hot snare)  . TRANSURETHRAL RESECTION OF PROSTATE N/A 05/04/2018   Procedure: TRANSURETHRAL RESECTION OF THE PROSTATE (TURP);  Surgeon: Kathie Rhodes, MD;  Location: [facility];  Service: Urology;  Laterality: N/A;        Home Medications    Prior to Admission medications   Medication Sig Start Date End Date Taking? Authorizing Provider  aspirin 325 MG tablet Take 1 tablet (325 mg total) by mouth daily. 08/27/18   [name], MD  atorvastatin (LIPITOR) 40 MG tablet Take 1 tablet (40 mg total) by mouth daily. 08/10/18   Kathie Dike, MD  lisinopril (PRINIVIL,ZESTRIL) 10 MG tablet Take 1 tablet (10 mg total) by mouth daily. 08/10/18 04/10/22  Kathie Dike, MD    Family History Family History  Problem Relation Age of Onset  . Cancer Mother     Social History Social History   Tobacco Use  . Smoking status: Never Smoker  . Smokeless tobacco: Never Used  Substance Use Topics  . Alcohol use: Yes    Comment: occ  . Drug use: No     Allergies   Patient has no known allergies.   Review of Systems Review of Systems  Unable to perform ROS: Mental status change     Physical Exam Updated Vital Signs BP 98/70   Pulse 92   Temp 98 F (36.7 C) (Oral)   Resp 20  Ht 1.778 m (5\' 10" )   Wt 84.4 kg   SpO2 98%   BMI 26.69 kg/m   Physical Exam Vitals signs and nursing note reviewed.  Constitutional:      Appearance: Normal appearance. He is well-developed.  HENT:     Head: Normocephalic and atraumatic.     Mouth/Throat:     Mouth: Mucous membranes are moist.  Eyes:     Extraocular Movements: Extraocular movements intact.     Conjunctiva/sclera: Conjunctivae normal.     Pupils: Pupils are equal, round, and reactive to light.  Neck:     Musculoskeletal: Normal range of motion and neck supple.  Cardiovascular:     Rate and Rhythm: Normal rate and regular rhythm.     Heart sounds: No  murmur.  Pulmonary:     Effort: Pulmonary effort is normal. No respiratory distress.     Breath sounds: Normal breath sounds.  Chest:     Chest wall: No tenderness.  Abdominal:     General: Bowel sounds are normal.     Palpations: Abdomen is soft.     Tenderness: There is no abdominal tenderness.  Musculoskeletal:        General: No swelling.  Skin:    General: Skin is warm and dry.     Capillary Refill: Capillary refill takes less than 2 seconds.  Neurological:     Mental Status: He is alert.     Cranial Nerves: No cranial nerve deficit.     Comments: Patient is alert will follow commands.  No obvious slurred speech.  No facial droop.  Questionable weakness to the right upper extremity.      ED Treatments / Results  Labs (all labs ordered are listed, but only abnormal results are displayed) Labs Reviewed  ETHANOL - Abnormal; Notable for the following components:      Result Value   Alcohol, Ethyl (B) 304 (*)    All other components within normal limits  APTT - Abnormal; Notable for the following components:   aPTT 37 (*)    All other components within normal limits  CBC - Abnormal; Notable for the following components:   MCV 101.6 (*)    MCH 34.4 (*)    All other components within normal limits  DIFFERENTIAL - Abnormal; Notable for the following components:   Eosinophils Absolute 0.7 (*)    All other components within normal limits  COMPREHENSIVE METABOLIC PANEL - Abnormal; Notable for the following components:   Sodium 134 (*)    Potassium 3.2 (*)    CO2 21 (*)    Glucose, Bld 204 (*)    Calcium 8.8 (*)    All other components within normal limits  URINALYSIS, ROUTINE W REFLEX MICROSCOPIC - Abnormal; Notable for the following components:   Color, Urine STRAW (*)    Specific Gravity, Urine 1.002 (*)    Glucose, UA 50 (*)    All other components within normal limits  PROTIME-INR  RAPID URINE DRUG SCREEN, HOSP PERFORMED  I-STAT CREATININE, ED    EKG EKG  Interpretation  Date/Time:  Thursday September 20 2018 16:42:40 EDT Ventricular Rate:  100 PR Interval:    QRS Duration: 89 QT Interval:  370 QTC Calculation: 478 R Axis:   69 Text Interpretation:  Sinus tachycardia Abnormal inferior Q waves Borderline prolonged QT interval Confirmed by Fredia Sorrow [phone]) on 09/20/2018 7:13:23 PM   Radiology No results found.   Results for orders placed or performed during the hospital encounter of  09/20/18  Ethanol  Result Value Ref Range   Alcohol, Ethyl (B) 304 (HH) <10 mg/dL  Protime-INR  Result Value Ref Range   Prothrombin Time 13.5 11.4 - 15.2 seconds   INR 1.0 0.8 - 1.2  APTT  Result Value Ref Range   aPTT 37 (H) 24 - 36 seconds  CBC  Result Value Ref Range   WBC 9.5 4.0 - 10.5 K/uL   RBC 4.50 4.22 - 5.81 MIL/uL   Hemoglobin 15.5 13.0 - 17.0 g/dL   HCT 45.7 39.0 - 52.0 %   MCV 101.6 (H) 80.0 - 100.0 fL   MCH 34.4 (H) 26.0 - 34.0 pg   MCHC 33.9 30.0 - 36.0 g/dL   RDW 11.5 11.5 - 15.5 %   Platelets 227 150 - 400 K/uL   nRBC 0.0 0.0 - 0.2 %  Differential  Result Value Ref Range   Neutrophils Relative % 44 %   Neutro Abs 4.3 1.7 - 7.7 K/uL   Lymphocytes Relative 39 %   Lymphs Abs 3.7 0.7 - 4.0 K/uL   Monocytes Relative 8 %   Monocytes Absolute 0.7 0.1 - 1.0 K/uL   Eosinophils Relative 8 %   Eosinophils Absolute 0.7 (H) 0.0 - 0.5 K/uL   Basophils Relative 1 %   Basophils Absolute 0.1 0.0 - 0.1 K/uL   Immature Granulocytes 0 %   Abs Immature Granulocytes 0.02 0.00 - 0.07 K/uL  Comprehensive metabolic panel  Result Value Ref Range   Sodium 134 (L) 135 - 145 mmol/L   Potassium 3.2 (L) 3.5 - 5.1 mmol/L   Chloride 102 98 - 111 mmol/L   CO2 21 (L) 22 - 32 mmol/L   Glucose, Bld 204 (H) 70 - 99 mg/dL   BUN 11 8 - 23 mg/dL   Creatinine, Ser 0.88 0.61 - 1.24 mg/dL   Calcium 8.8 (L) 8.9 - 10.3 mg/dL   Total Protein 7.1 6.5 - 8.1 g/dL   Albumin 3.5 3.5 - 5.0 g/dL   AST 22 15 - 41 U/L   ALT 16 0 - 44 U/L   Alkaline Phosphatase  78 38 - 126 U/L   Total Bilirubin 0.8 0.3 - 1.2 mg/dL   GFR calc non Af Amer >60 >60 mL/min   GFR calc Af Amer >60 >60 mL/min   Anion gap 11 5 - 15  Urine rapid drug screen (hosp performed)  Result Value Ref Range   Opiates NONE DETECTED NONE DETECTED   Cocaine NONE DETECTED NONE DETECTED   Benzodiazepines NONE DETECTED NONE DETECTED   Amphetamines NONE DETECTED NONE DETECTED   Tetrahydrocannabinol NONE DETECTED NONE DETECTED   Barbiturates NONE DETECTED NONE DETECTED  Urinalysis, Routine w reflex microscopic  Result Value Ref Range   Color, Urine STRAW (A) YELLOW   APPearance CLEAR CLEAR   Specific Gravity, Urine 1.002 (L) 1.005 - 1.030   pH 6.0 5.0 - 8.0   Glucose, UA 50 (A) NEGATIVE mg/dL   Hgb urine dipstick NEGATIVE NEGATIVE   Bilirubin Urine NEGATIVE NEGATIVE   Ketones, ur NEGATIVE NEGATIVE mg/dL   Protein, ur NEGATIVE NEGATIVE mg/dL   Nitrite NEGATIVE NEGATIVE   Leukocytes,Ua NEGATIVE NEGATIVE  I-stat Creatinine, ED  Result Value Ref Range   Creatinine, Ser 1.10 0.61 - 1.24 mg/dL   Mr [facility] Wo Contrast  Result Date: 09/20/2018 CLINICAL DATA:  Confusion and right-sided deficits.  Recent stroke. EXAM: MRI HEAD WITHOUT CONTRAST MRA HEAD WITHOUT CONTRAST TECHNIQUE: Multiplanar, multiecho pulse sequences of the brain and  surrounding structures were obtained without intravenous contrast. Angiographic images of the head were obtained using MRA technique without contrast. COMPARISON:  Head CT 09/20/2018 Brain MRI/MRA 08/08/2018 FINDINGS: MRI HEAD FINDINGS BRAIN: There is an area of abnormal hyperintensity on diffusion-weighted imaging within the left corona radiata without associated depressed ADC value. Site corresponds to the acute infarct demonstrated 08/08/2018. No new site of ischemia. The the midline structures are normal. Multifocal periventricular white matter hyperintensity, most often a result of chronic microvascular ischemia. the CSF spaces are normal for age, with no  hydrocephalus. Susceptibility-sensitive sequences show no chronic microhemorrhage or superficial siderosis. SKULL AND UPPER CERVICAL SPINE: The visualized skull base, calvarium, upper cervical spine and extracranial soft tissues are normal. SINUSES/ORBITS: No fluid levels or advanced mucosal thickening. No mastoid or middle ear effusion. The orbits are normal. MRA HEAD FINDINGS The MRA is markedly motion degraded. Stenosis of the proximal right MCA M1 segment is unchanged. Multifocal narrowing of the M2 segments bilaterally also appears unchanged. There is a fetal origin of the right posterior cerebral artery. Left vertebral artery terminates in PICA. IMPRESSION: 1. Resolving diffusion abnormality within the left corona radiata, at the site of recent watershed infarct. No new infarct. 2. Findings of chronic ischemic microangiopathy. 3. Motion degraded MRA showing multifocal atherosclerotic narrowing of the middle cerebral arteries, unchanged. Electronically Signed   By: Ulyses Jarred M.D.   On: 09/20/2018 18:44   Mr Brain Wo Contrast (neuro Protocol)  Result Date: 09/20/2018 CLINICAL DATA:  Confusion and right-sided deficits.  Recent stroke. EXAM: MRI HEAD WITHOUT CONTRAST MRA HEAD WITHOUT CONTRAST TECHNIQUE: Multiplanar, multiecho pulse sequences of the brain and surrounding structures were obtained without intravenous contrast. Angiographic images of the head were obtained using MRA technique without contrast. COMPARISON:  Head CT 09/20/2018 Brain MRI/MRA 08/08/2018 FINDINGS: MRI HEAD FINDINGS BRAIN: There is an area of abnormal hyperintensity on diffusion-weighted imaging within the left corona radiata without associated depressed ADC value. Site corresponds to the acute infarct demonstrated 08/08/2018. No new site of ischemia. The the midline structures are normal. Multifocal periventricular white matter hyperintensity, most often a result of chronic microvascular ischemia. the CSF spaces are normal for age,  with no hydrocephalus. Susceptibility-sensitive sequences show no chronic microhemorrhage or superficial siderosis. SKULL AND UPPER CERVICAL SPINE: The visualized skull base, calvarium, upper cervical spine and extracranial soft tissues are normal. SINUSES/ORBITS: No fluid levels or advanced mucosal thickening. No mastoid or middle ear effusion. The orbits are normal. MRA HEAD FINDINGS The MRA is markedly motion degraded. Stenosis of the proximal right MCA M1 segment is unchanged. Multifocal narrowing of the M2 segments bilaterally also appears unchanged. There is a fetal origin of the right posterior cerebral artery. Left vertebral artery terminates in PICA. IMPRESSION: 1. Resolving diffusion abnormality within the left corona radiata, at the site of recent watershed infarct. No new infarct. 2. Findings of chronic ischemic microangiopathy. 3. Motion degraded MRA showing multifocal atherosclerotic narrowing of the middle cerebral arteries, unchanged. Electronically Signed   By: Ulyses Jarred M.D.   On: 09/20/2018 18:44   Ct Head Code Stroke Wo Contrast  Result Date: 09/20/2018 CLINICAL DATA:  Code stroke. Confusion right-sided weakness facial droop EXAM: CT HEAD WITHOUT CONTRAST TECHNIQUE: Contiguous axial images were obtained from the base of the skull through the vertex without intravenous contrast. COMPARISON:  MRI head 08/08/2018 FINDINGS: Brain: Recent watershed infarcts on the left. Diffuse white matter disease bilaterally compatible with ischemic change. Ill-defined hypodensity right cerebellar white matter not seen on the prior  study and could represent acute infarct. However, this area is obscured by streak artifact and motion and this is not a definite finding Negative for hemorrhage or mass.  Generalized atrophy. Vascular: Negative for hyperdense vessel. Atherosclerotic calcification right internal carotid artery terminus Skull: Negative Sinuses/Orbits: Negative Other: None ASPECTS ([place] Stroke  Program Early CT Score) - Ganglionic level infarction (caudate, lentiform nuclei, internal capsule, insula, M1-M3 cortex): 7 - Supraganglionic infarction (M4-M6 cortex): 3 Total score (0-10 with 10 being normal): 10 IMPRESSION: 1. New hypodensity right cerebellar white matter, possible artifact versus acute infarct. MRI may be necessary to evaluate further 2. Atrophy and chronic ischemic changes. Recent watershed infarct left cerebral white matter on MRI of 08/08/2018. 3. ASPECTS is 10 4. These results were called by telephone at the time of interpretation on 09/20/2018 at 5:51 pm to Dr. Fredia Sorrow , who verbally acknowledged these results. Electronically Signed   By: Franchot Gallo M.D.   On: 09/20/2018 17:51     Procedures Procedures (including critical care time)  CRITICAL CARE Performed by: Fredia Sorrow Total critical care time: 30 minutes Critical care time was exclusive of separately billable procedures and treating other patients. Critical care was necessary to treat or prevent imminent or life-threatening deterioration. Critical care was time spent personally by me on the following activities: development of treatment plan with patient and/or surrogate as well as nursing, discussions with consultants, evaluation of patient's response to treatment, examination of patient, obtaining history from patient or surrogate, ordering and performing treatments and interventions, ordering and review of laboratory studies, ordering and review of radiographic studies, pulse oximetry and re-evaluation of patient's condition.   Medications Ordered in ED Medications - No data to display   Initial Impression / Assessment and Plan / ED Course  I have reviewed the triage vital signs and the nursing notes.  Pertinent labs & imaging results that were available during my care of the patient were reviewed by me and considered in my medical decision making (see chart for details).         Patient  brought in by EMS concerns for code stroke.  Patient had right-sided facial droop and weakness to right arm rapidly resolving upon arrival to the emergency department.  Patient had a history of alcohol abuse.  Code stroke was initiated but patient not a candidate for TPA with the developing improvement.  Patient had CT head which raise some concerns for cerebellar infarct.  Patient had a previous stroke and there was still some watershed changes there but nothing new.  Based on these findings patient had MRI/MRA.  Which showed no evidence of an acute stroke.  Patient's work-up for the concerns for stroke without any acute findings.  Patient definitely was intoxicated.  Markedly elevated alcohol level.  But patient functional and stable for discharge home.  Final Clinical Impressions(s) / ED Diagnoses   Final diagnoses:  Alcoholic intoxication with complication [facility])    ED Discharge Orders    None       Fredia Sorrow, MD 09/24/18 [id number]    Fredia Sorrow, MD 09/24/18 [phone]

## 2018-09-20 NOTE — ED Triage Notes (Signed)
Per EMS, pt had a stroke about a month ago that caused RT sided deficits. Per EMS, family reported that pt woke up at baseline this morning. Around noon, pt became confused. EMS reports that when they arrived, pt had RT sided weakness with drift, RT sided facial droop, and was confused. During transport, pt became more alert, and was oriented to person and time. CBG 225.

## 2018-09-20 NOTE — ED Notes (Signed)
Patient transported to CT 

## 2018-11-20 ENCOUNTER — Ambulatory Visit: Payer: Medicare Other | Admitting: Family Medicine

## 2018-11-29 ENCOUNTER — Encounter: Payer: Self-pay | Admitting: Family Medicine

## 2018-11-29 ENCOUNTER — Ambulatory Visit (INDEPENDENT_AMBULATORY_CARE_PROVIDER_SITE_OTHER): Payer: Medicare Other | Admitting: Family Medicine

## 2018-11-29 ENCOUNTER — Other Ambulatory Visit: Payer: Self-pay

## 2018-11-29 DIAGNOSIS — E119 Type 2 diabetes mellitus without complications: Secondary | ICD-10-CM | POA: Diagnosis not present

## 2018-11-29 DIAGNOSIS — F1010 Alcohol abuse, uncomplicated: Secondary | ICD-10-CM

## 2018-11-29 DIAGNOSIS — I10 Essential (primary) hypertension: Secondary | ICD-10-CM | POA: Diagnosis not present

## 2018-11-29 MED ORDER — ATORVASTATIN CALCIUM 40 MG PO TABS: 40.0000 mg | ORAL_TABLET | Freq: Every day | ORAL | 1 refills | Status: DC

## 2018-11-29 MED ORDER — ASPIRIN 325 MG PO TABS: 325.0000 mg | ORAL_TABLET | Freq: Every day | ORAL | 5 refills | Status: DC

## 2018-11-29 MED ORDER — LISINOPRIL 10 MG PO TABS: 10.0000 mg | ORAL_TABLET | Freq: Every day | ORAL | 1 refills | Status: DC

## 2018-11-29 NOTE — Progress Notes (Signed)
   Subjective:    Patient ID: Miguel Solis, male    DOB: 1951-06-06, 68 y.o.   MRN: [id number]  Patient arrives for multiple concerns Hypertension  This is a chronic problem. Risk factors for coronary artery disease include diabetes mellitus. There are no compliance problems.   Diabetes  He presents for his follow-up diabetic visit. He has type 2 diabetes mellitus. There are no hypoglycemic associated symptoms. There are no diabetic associated symptoms. There are no hypoglycemic complications. There are no diabetic complications. Risk factors for coronary artery disease include hypertension. He sees a podiatrist. Pt currently not on any diabetic medication. Pt does not have a meter to check blood sugar.  Not interested in a meter  Virtual Visit via Video Note  I connected with Miguel Solis on 11/29/18 at  2:00 PM EDT by a video enabled telemedicine application and verified that I am speaking with the correct person using two identifiers.  Location: Patient: home Provider: office   I discussed the limitations of evaluation and management by telemedicine and the availability of in person appointments. The patient expressed understanding and agreed to proceed.  History of Present Illness:    Observations/Objective:   Assessment and Plan:   Follow Up Instructions:    I discussed the assessment and treatment plan with the patient. The patient was provided an opportunity to ask questions and all were answered. The patient agreed with the plan and demonstrated an understanding of the instructions.   The patient was advised to call back or seek an in-person evaluation if the symptoms worsen or if the condition fails to improve as anticipated.  Blood pressure medicine and blood pressure levels reviewed today with patient. Compliant with blood pressure medicine. States does not miss a dose. No obvious side effects. Blood pressure generally good when checked elsewhere. Watching salt  intake.   Patient claims compliance with diabetes medication. No obvious side effects. Reports no substantial low sugar spells. Most numbers are generally in good range when checked fasting. Generally does not miss a dose of medication. Watching diabetic diet closely  Of note A1c was 6.7 with hospitalization last winter.  Patient states mostly watching his diet  Positive history of stroke.  No new stroke symptomatology thankfully positive history of alcohol abuse patient claims he is working on this and succeeding his wife has stated that he has ongoing challenges in this regard.  Participating in [place] I provided 25 minutes of non-face-to-face time during this encounter.   Vicente Males, LPN     Review of Systems No headache, no major weight loss or weight gain, no chest pain no back pain abdominal pain no change in bowel habits complete ROS otherwise negative     Objective:   Physical Exam   Virtual visit     Assessment & Plan:  Impression type 2 diabetes.  Apparent decent control.  Diet discussed.  2.  Hypertension.  Compliant with meds to maintain same meds  3.  Status post stroke clinically stable  4.  History of alcohol abuse discussed  Follow-up in 1 4 months diet exercise discussed medications refilled warning signs discussed  Greater than 50% of this 25 minute face to face visit was spent in counseling and discussion and coordination of care regarding the above diagnosis/diagnosies

## 2018-12-14 IMAGING — CT CT HEAD W/O CM
3 series · 15 of 47 positions shown, 18 images · non-contrast
Comparison: None.

CLINICAL DATA: 65 y/o M; status post fall with head injury.
Laceration to the left side of head.

EXAM:
CT HEAD WITHOUT CONTRAST
TECHNIQUE: Contiguous axial images were obtained from the base of the skull
through the vertex without intravenous contrast.

[Series 2: head trauma wo · axial · 0.46mm/px · z∈[+278,+403]mm · 9 of 30 slices shown, 12 images]
[im 3/30  brain]
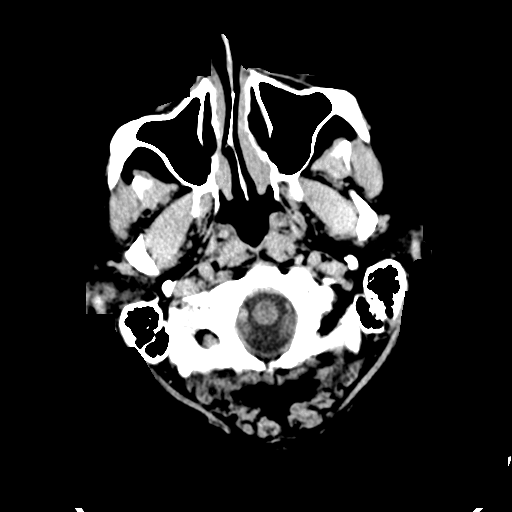
[im 3/30  bone]
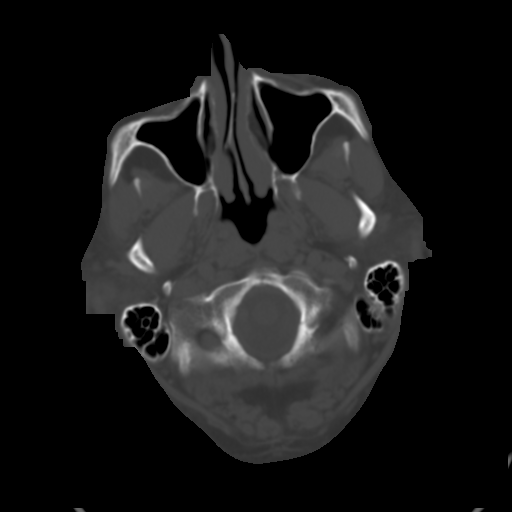
[im 6/30  brain]
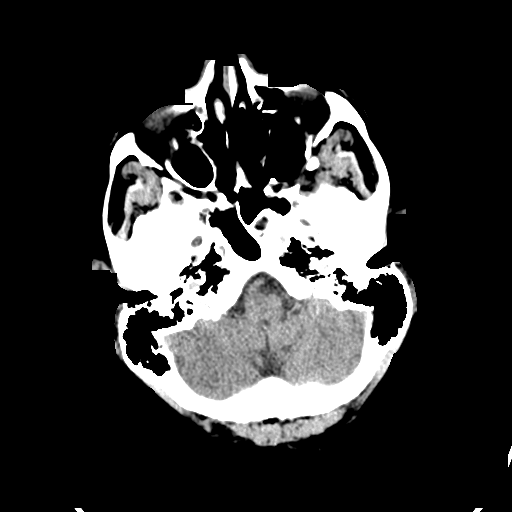
[im 9/30  brain]
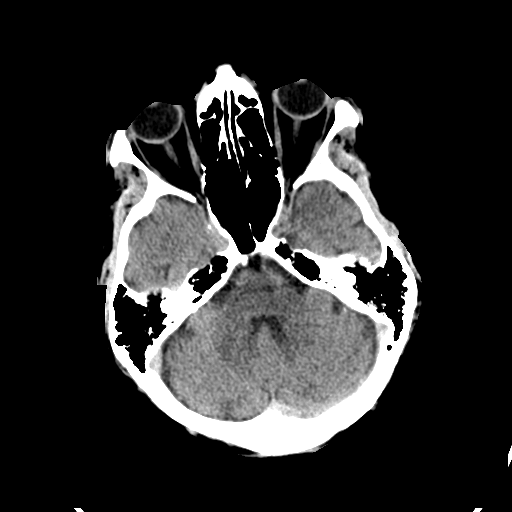
[im 12/30  brain]
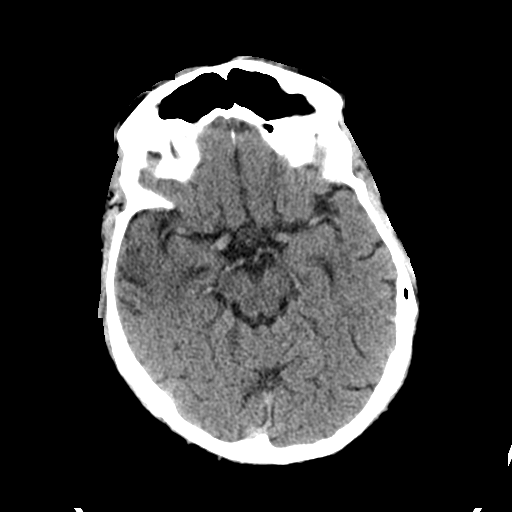
[im 16/30  brain]
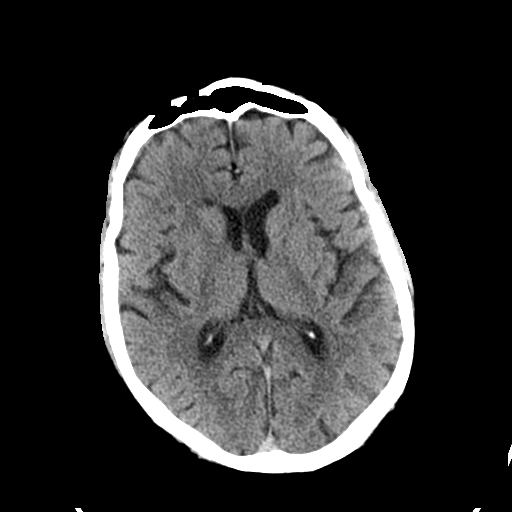
[im 16/30  bone]
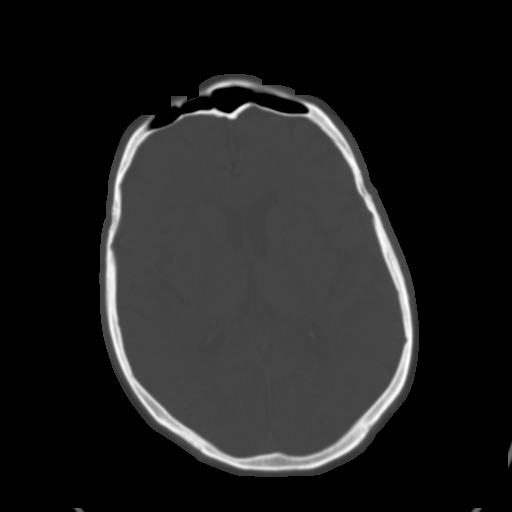
[im 19/30  brain]
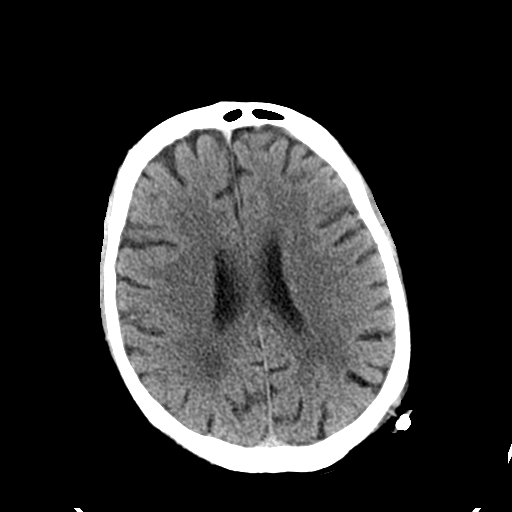
[im 22/30  brain]
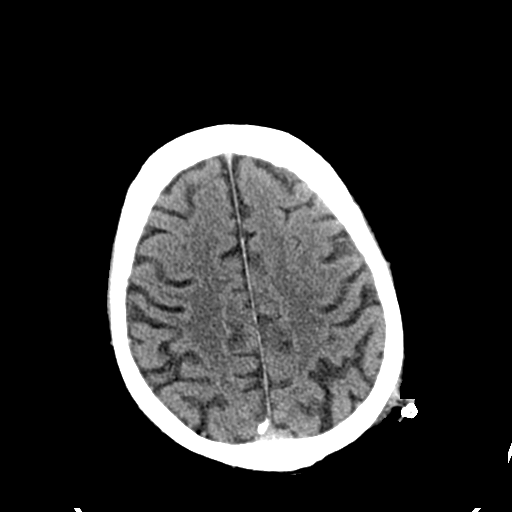
[im 25/30  brain]
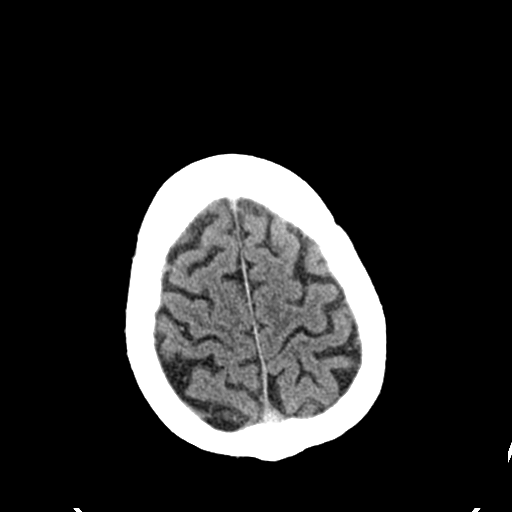
[im 28/30  brain]
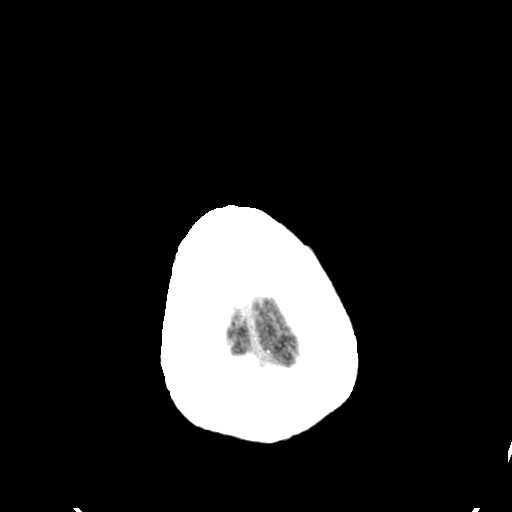
[im 28/30  bone]
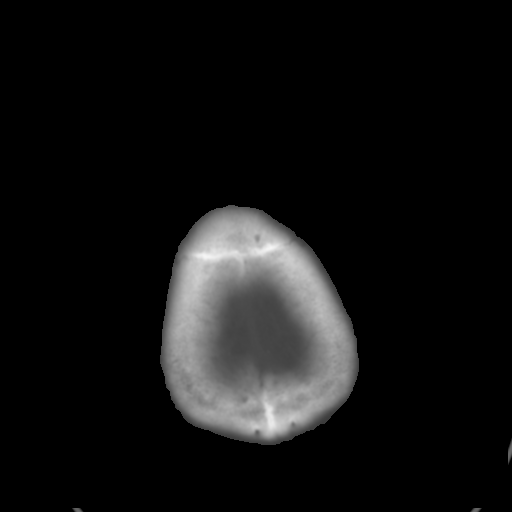

[Series 4: coronal soft tissue · coronal · 0.34mm/px · 3 of 79 slices shown]
[im 27/79  brain]
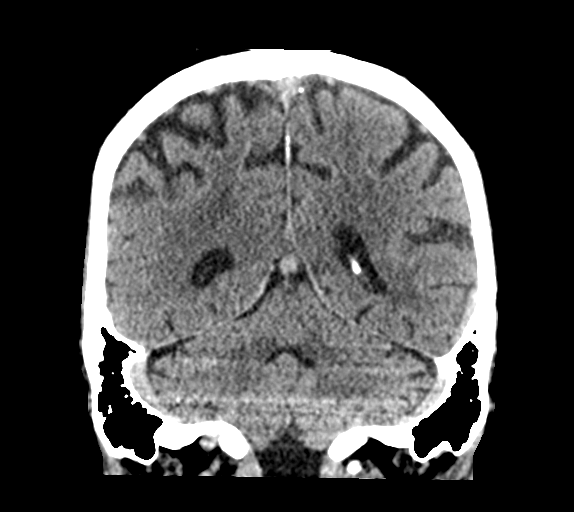
[im 35/79  brain]
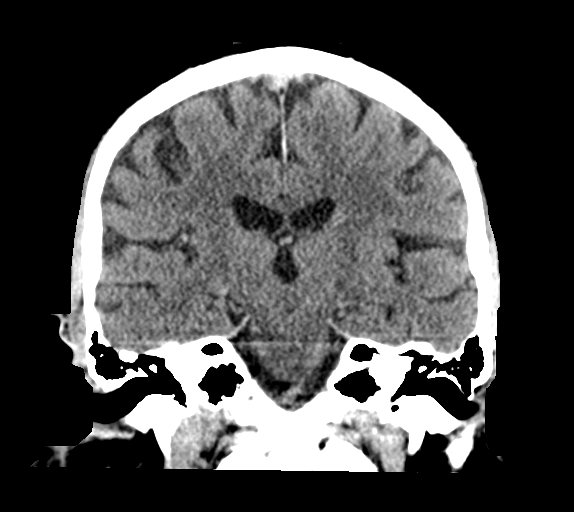
[im 44/79  brain]
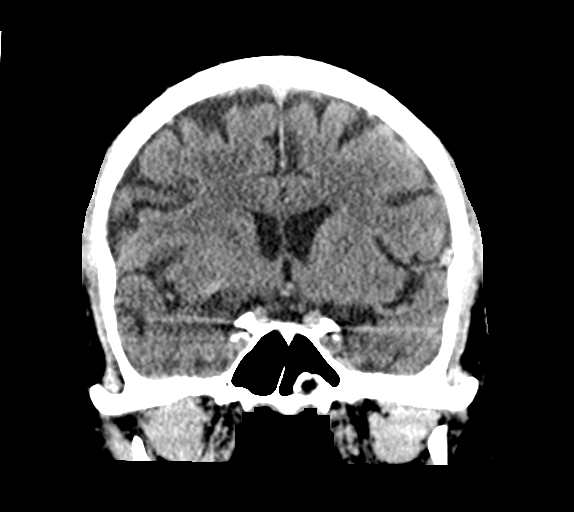

[Series 5: sagittal soft tissue · sagittal · 0.34mm/px · 3 of 67 slices shown]
[im 23/67  brain]
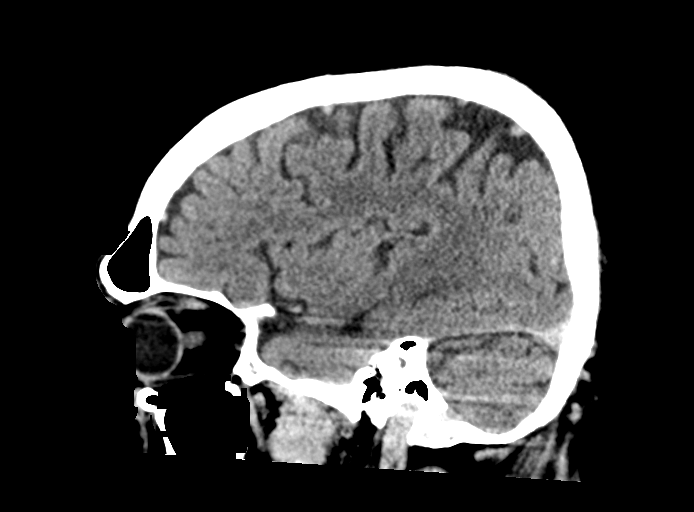
[im 34/67  brain]
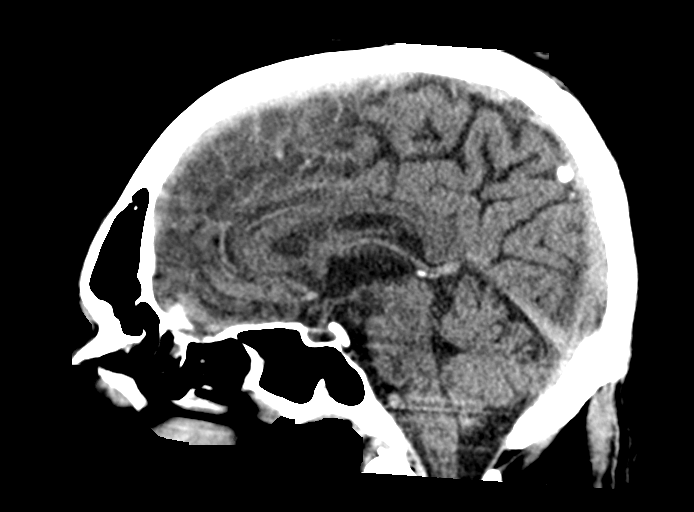
[im 45/67  brain]
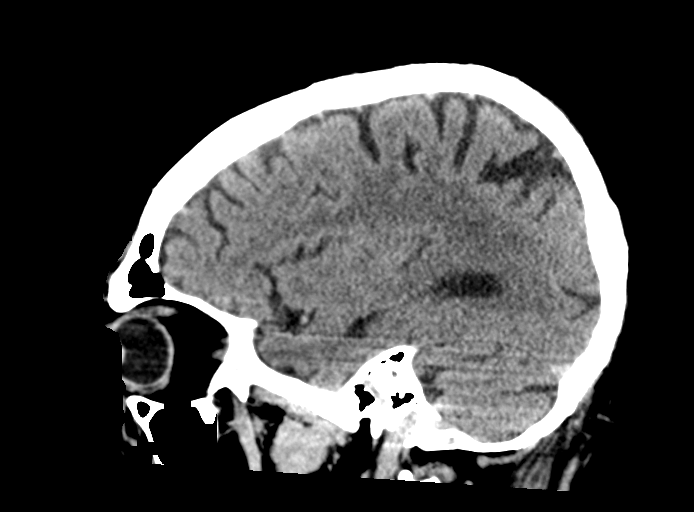

[15 of 47 positions shown; findings below may reference images not displayed]

FINDINGS: Brain: No evidence of acute infarction, hemorrhage, hydrocephalus,
extra-axial collection or mass lesion/mass effect. Few nonspecific
foci of hypoattenuation in subcortical and periventricular white
matter compatible with mild chronic microvascular ischemic changes.
Mild brain parenchymal volume loss.

Vascular: No hyperdense vessel or unexpected calcification.

Skull: Left parietal region small scalp contusion and laceration. No
calvarial fracture.

Sinuses/Orbits: Mild paranasal sinus mucosal thickening. Normal
aeration of mastoid air cells. Orbits are unremarkable.

Other: None.
IMPRESSION: 1. Left parietal region small scalp contusion and laceration. No
calvarial fracture.
2. No acute intracranial abnormality.
3. Mild chronic microvascular ischemic changes and parenchymal
volume loss of the brain.

By: Zveki Ory M.D.

## 2018-12-25 ENCOUNTER — Telehealth: Payer: Self-pay | Admitting: Family Medicine

## 2018-12-25 DIAGNOSIS — M25569 Pain in unspecified knee: Secondary | ICD-10-CM

## 2018-12-25 NOTE — Telephone Encounter (Signed)
Please go ahead and give referral to Ortho of his choice

## 2018-12-25 NOTE — Telephone Encounter (Addendum)
Referral ordered in Epic. 

## 2018-12-25 NOTE — Telephone Encounter (Signed)
Pt is needing a referral for ortho to have fluid drawn off his knee

## 2019-01-01 ENCOUNTER — Encounter: Payer: Self-pay | Admitting: Family Medicine

## 2019-01-02 ENCOUNTER — Ambulatory Visit: Payer: Medicare Other | Admitting: Orthopaedic Surgery

## 2019-01-08 ENCOUNTER — Encounter: Payer: Self-pay | Admitting: Orthopaedic Surgery

## 2019-01-08 ENCOUNTER — Ambulatory Visit (INDEPENDENT_AMBULATORY_CARE_PROVIDER_SITE_OTHER): Payer: Medicare Other

## 2019-01-08 ENCOUNTER — Other Ambulatory Visit: Payer: Self-pay

## 2019-01-08 ENCOUNTER — Ambulatory Visit (INDEPENDENT_AMBULATORY_CARE_PROVIDER_SITE_OTHER): Payer: Medicare Other | Admitting: Orthopaedic Surgery

## 2019-01-08 DIAGNOSIS — M1711 Unilateral primary osteoarthritis, right knee: Secondary | ICD-10-CM

## 2019-01-08 MED ORDER — LIDOCAINE HCL 1 % IJ SOLN
2.0000 mL | INTRAMUSCULAR | Status: AC | PRN
  Administered 2019-01-08: 2 mL

## 2019-01-08 MED ORDER — BUPIVACAINE HCL 0.5 % IJ SOLN
2.0000 mL | INTRAMUSCULAR | Status: AC | PRN
  Administered 2019-01-08: 2 mL via INTRA_ARTICULAR

## 2019-01-08 MED ORDER — METHYLPREDNISOLONE ACETATE 40 MG/ML IJ SUSP
40.0000 mg | INTRAMUSCULAR | Status: AC | PRN
  Administered 2019-01-08: 40 mg via INTRA_ARTICULAR

## 2019-01-08 NOTE — Progress Notes (Signed)
Office Visit Note   Patient: Miguel Solis           Date of Birth: 1950/08/09           MRN: [id number] Visit Date: 01/08/2019              Requested by: [name], [address],  [place] [address] PCP: [name], MD   Assessment & Plan: Visit Diagnoses:  1. Unilateral primary osteoarthritis, right knee     Plan: Impression is right knee degenerative joint disease and moderate effusion.  We performed aspiration and injection today and obtained 20 cc of joint fluid.  Cortisone was injected.  Patient tolerated this well.  Patient would like to consider Visco supplementation if this cortisone injection does not give him much relief.  Follow-Up Instructions: Return if symptoms worsen or fail to improve.   Orders:  Orders Placed This Encounter  Procedures  . XR Knee Complete 4 Views Right   No orders of the defined types were placed in this encounter.     Procedures: Large Joint Inj: R knee on 01/08/2019 3:51 PM Indications: pain Details: 22 G needle  Arthrogram: No  Medications: 40 mg methylPREDNISolone acetate 40 MG/ML; 2 mL lidocaine 1 %; 2 mL bupivacaine 0.5 % Aspirate: 20 mL blood-tinged Outcome: tolerated well, no immediate complications Consent was given by the patient. Patient was prepped and draped in the usual sterile fashion.       Clinical Data: No additional findings.   Subjective: Chief Complaint  Patient presents with  . Right Knee - Pain    Miguel Solis comes in today for reevaluation of his right knee pain.  He was evaluated about a year ago for this.  He states that the cortisone injection helped really well and would like another one.  He is endorsing swelling today.  Denies any injuries.   Review of Systems   Objective: Vital Signs: There were no vitals taken for this visit.  Physical Exam  Ortho Exam Right knee exam shows a moderate joint effusion.  Otherwise exam is unchanged and unremarkable.  Specialty Comments:  No specialty comments available.  Imaging: Xr Knee Complete 4 Views Right  Result Date: 01/08/2019 Progression of degenerative joint disease of the right knee worst in the medial compartment    PMFS History: Patient Active Problem List   Diagnosis Date Noted  . Acute CVA (cerebrovascular accident) ([place]) 08/08/2018  . Right hemiparesis ([place]) 08/08/2018  . 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 Hypomagnesemia 08/07/2018  . Polycythemia 08/07/2018  . Abnormal EKG 08/07/2018  . 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 BPH with urinary obstruction 05/04/2018  . Alcohol abuse 04/03/2017  . Prostate hypertrophy 01/10/2013  . Elevated PSA 12/05/2012  . Hyperlipemia 11/08/2012  . Essential hypertension, benign 11/08/2012  . Hyperglycemia 11/08/2012   Past Medical History:  Diagnosis Date  . Colon polyp 2008  . Foley catheter in place   . Gout   . Hyperlipemia   . Hyperlipidemia   . Hypertension   . Impaired fasting glucose   . Urinary retention     Family History  Problem Relation Age of Onset  . Cancer Mother     Past Surgical History:  Procedure Laterality Date  . COLONOSCOPY N/A 08/18/2017   Procedure: COLONOSCOPY;  Surgeon: Daneil Dolin, MD;  Location: [facility];  Service: Endoscopy;  Laterality: N/A;  10:30  . KNEE SURGERY Left yrs  ago   torn meniscus  . POLYPECTOMY  08/18/2017   Procedure: POLYPECTOMY;  Surgeon: Daneil Dolin, MD;  Location: [facility];  Service: Endoscopy;;  cecal x2 (cold snare)and ascending colon x2 (hot snare)  . TRANSURETHRAL RESECTION OF PROSTATE N/A 05/04/2018   Procedure: TRANSURETHRAL RESECTION OF THE PROSTATE (TURP);  Surgeon: Kathie Rhodes, MD;  Location: [facility];  Service: Urology;  Laterality: N/A;   Social History   Occupational History  . Not on file  Tobacco Use  . Smoking status: Never Smoker  . Smokeless tobacco: Never Used  Substance and  Sexual Activity  . Alcohol use: Yes    Comment: occ  . Drug use: No  . Sexual activity: Not on file

## 2019-02-04 ENCOUNTER — Telehealth: Payer: Self-pay | Admitting: Family Medicine

## 2019-02-04 NOTE — Telephone Encounter (Signed)
Paper from dmv stated patient needed form filled out by a cardio pulmonary physician licensed in [place](I have never seen this paper before) please advise

## 2019-02-04 NOTE — Telephone Encounter (Signed)
Pt came in the office today to have a paper signed saying he could drive but they paper stated a cardiologist or pulmonologist needed to be the ones to sign it. Pt is now needing a referral to a heart doctor so he can have this paper signed.

## 2019-02-15 ENCOUNTER — Telehealth: Payer: Self-pay | Admitting: Family Medicine

## 2019-02-15 NOTE — Telephone Encounter (Signed)
Please advise. Thank you

## 2019-02-15 NOTE — Telephone Encounter (Signed)
Pt came by office on 8/3 to have paper filled out note in from that date. He is needing a referral to cardiology or pulmonary to be able to have that filled out. He prefers a Patent examiner.

## 2019-02-19 ENCOUNTER — Other Ambulatory Visit: Payer: Self-pay | Admitting: *Deleted

## 2019-02-19 DIAGNOSIS — I10 Essential (primary) hypertension: Secondary | ICD-10-CM

## 2019-02-19 NOTE — Telephone Encounter (Signed)
dont know what this is about lets do card ref

## 2019-02-19 NOTE — Telephone Encounter (Signed)
Referral put in.

## 2019-02-21 ENCOUNTER — Encounter: Payer: Self-pay | Admitting: Family Medicine

## 2019-04-03 ENCOUNTER — Ambulatory Visit: Payer: Medicare Other | Admitting: Cardiology

## 2019-04-05 ENCOUNTER — Encounter: Payer: Self-pay | Admitting: Cardiology

## 2019-04-05 ENCOUNTER — Encounter: Payer: Medicare Other | Admitting: Cardiology

## 2019-04-05 ENCOUNTER — Other Ambulatory Visit: Payer: Self-pay

## 2019-04-05 NOTE — Progress Notes (Signed)
Erroneous encounter This encounter was created in error - please disregard. 

## 2019-04-15 ENCOUNTER — Telehealth: Payer: Self-pay | Admitting: Family Medicine

## 2019-04-15 NOTE — Telephone Encounter (Signed)
Card had sent me a message they could not do.Call pt, rec he come in for an ov, have him bring paper work, we may be able to save a visit to the pulm dr

## 2019-04-15 NOTE — Telephone Encounter (Signed)
Pt contacted and verbalized understanding. Pt transferred up front to set up appt for office visit.

## 2019-04-15 NOTE — Telephone Encounter (Signed)
Pt states that cardiology was going to call here to recommend pt see pulmonary - have we heard from them?  (didn't see anything in the chart)  Please advise & call pt

## 2019-04-15 NOTE — Telephone Encounter (Signed)
04/12/2019 on referral notes states that per Dr.McDowell pt does not need to see cardiologist. Please advise. Thank you.

## 2019-04-25 ENCOUNTER — Ambulatory Visit (INDEPENDENT_AMBULATORY_CARE_PROVIDER_SITE_OTHER): Payer: Medicare Other | Admitting: Family Medicine

## 2019-04-25 ENCOUNTER — Other Ambulatory Visit: Payer: Self-pay

## 2019-04-25 VITALS — BP 130/80 | Temp 98.3°F | Wt 174.4 lb

## 2019-04-25 DIAGNOSIS — I10 Essential (primary) hypertension: Secondary | ICD-10-CM | POA: Diagnosis not present

## 2019-04-25 DIAGNOSIS — R0609 Other forms of dyspnea: Secondary | ICD-10-CM

## 2019-04-25 DIAGNOSIS — Z125 Encounter for screening for malignant neoplasm of prostate: Secondary | ICD-10-CM | POA: Diagnosis not present

## 2019-04-25 DIAGNOSIS — E785 Hyperlipidemia, unspecified: Secondary | ICD-10-CM

## 2019-04-25 DIAGNOSIS — Z7689 Persons encountering health services in other specified circumstances: Secondary | ICD-10-CM

## 2019-04-25 NOTE — Progress Notes (Signed)
   Subjective:    Patient ID: Miguel Solis, male    DOB: [birth year], 68 y.o.   MRN: [id number]  HPI  Patient received forms from [facility] stating he needed clearance from cardiology and pulmonology to drive.  Review of Systems No headache, no major weight loss or weight gain, no chest pain no back pain abdominal pain no change in bowel habits complete ROS otherwise negative     Objective:   Physical Exam   Alert vitals stable, NAD. Blood pressure good on repeat. HEENT normal. Lungs clear. Heart regular rate and rhythm.      Assessment & Plan:  Impression recurrent DUIs.  Apparently the patient states that he is unable to use his alcohol detector recognition device.  Patient claims that his breathing is not good enough to trigger it.  Therefore he is received this form from the Department [employer] which states he needs to separate pulmonary physician assessments to test his pulmonary status and to agree that it is insufficient to blow into 1 of these machines.  If family could Dr. Reubin Milan fill this out.  These forms are explained at length to patient.  We will attempt to make referral to 2 separate pulmonary doctors.  I believe this is going to be challenging  Greater than 50% of this 25 minute face to face visit was spent in counseling and discussion and coordination of care regarding the above diagnosis/diagnosies

## 2019-05-05 ENCOUNTER — Encounter: Payer: Self-pay | Admitting: Family Medicine

## 2019-05-22 ENCOUNTER — Emergency Department (HOSPITAL_COMMUNITY): Payer: Medicare Other

## 2019-05-22 ENCOUNTER — Other Ambulatory Visit: Payer: Self-pay

## 2019-05-22 ENCOUNTER — Encounter (HOSPITAL_COMMUNITY): Payer: Self-pay

## 2019-05-22 ENCOUNTER — Emergency Department (HOSPITAL_COMMUNITY)
Admission: EM | Admit: 2019-05-22 | Discharge: 2019-05-23 | Disposition: A | Discharge status: Home / Self Care | Payer: Medicare Other | Attending: Emergency Medicine | Admitting: Emergency Medicine

## 2019-05-22 DIAGNOSIS — W010XXA Fall on same level from slipping, tripping and stumbling without subsequent striking against object, initial encounter: Secondary | ICD-10-CM | POA: Insufficient documentation

## 2019-05-22 DIAGNOSIS — S199XXA Unspecified injury of neck, initial encounter: Secondary | ICD-10-CM | POA: Diagnosis not present

## 2019-05-22 DIAGNOSIS — Y908 Blood alcohol level of 240 mg/100 ml or more: Secondary | ICD-10-CM | POA: Diagnosis not present

## 2019-05-22 DIAGNOSIS — Z23 Encounter for immunization: Secondary | ICD-10-CM | POA: Insufficient documentation

## 2019-05-22 DIAGNOSIS — Y92009 Unspecified place in unspecified non-institutional (private) residence as the place of occurrence of the external cause: Secondary | ICD-10-CM | POA: Diagnosis not present

## 2019-05-22 DIAGNOSIS — R000 Tachycardia, unspecified: Secondary | ICD-10-CM | POA: Diagnosis not present

## 2019-05-22 DIAGNOSIS — R519 Headache, unspecified: Secondary | ICD-10-CM | POA: Insufficient documentation

## 2019-05-22 DIAGNOSIS — F1010 Alcohol abuse, uncomplicated: Secondary | ICD-10-CM

## 2019-05-22 DIAGNOSIS — W19XXXA Unspecified fall, initial encounter: Secondary | ICD-10-CM

## 2019-05-22 DIAGNOSIS — Z7982 Long term (current) use of aspirin: Secondary | ICD-10-CM | POA: Insufficient documentation

## 2019-05-22 DIAGNOSIS — Y939 Activity, unspecified: Secondary | ICD-10-CM | POA: Diagnosis not present

## 2019-05-22 DIAGNOSIS — I10 Essential (primary) hypertension: Secondary | ICD-10-CM | POA: Insufficient documentation

## 2019-05-22 DIAGNOSIS — Z79899 Other long term (current) drug therapy: Secondary | ICD-10-CM | POA: Diagnosis not present

## 2019-05-22 DIAGNOSIS — R52 Pain, unspecified: Secondary | ICD-10-CM | POA: Diagnosis not present

## 2019-05-22 DIAGNOSIS — R404 Transient alteration of awareness: Secondary | ICD-10-CM | POA: Diagnosis not present

## 2019-05-22 DIAGNOSIS — F10129 Alcohol abuse with intoxication, unspecified: Secondary | ICD-10-CM | POA: Diagnosis not present

## 2019-05-22 DIAGNOSIS — Y999 Unspecified external cause status: Secondary | ICD-10-CM | POA: Insufficient documentation

## 2019-05-22 DIAGNOSIS — S0003XA Contusion of scalp, initial encounter: Secondary | ICD-10-CM | POA: Diagnosis not present

## 2019-05-22 DIAGNOSIS — S01112A Laceration without foreign body of left eyelid and periocular area, initial encounter: Secondary | ICD-10-CM | POA: Insufficient documentation

## 2019-05-22 DIAGNOSIS — M25511 Pain in right shoulder: Secondary | ICD-10-CM | POA: Diagnosis not present

## 2019-05-22 DIAGNOSIS — S4991XA Unspecified injury of right shoulder and upper arm, initial encounter: Secondary | ICD-10-CM | POA: Diagnosis not present

## 2019-05-22 LAB — CBC WITH DIFFERENTIAL/PLATELET
Abs Immature Granulocytes: 0.03 10*3/uL (ref 0.00–0.07)
Basophils Absolute: 0.1 10*3/uL (ref 0.0–0.1)
Basophils Relative: 1 %
Eosinophils Absolute: 0.3 10*3/uL (ref 0.0–0.5)
Eosinophils Relative: 4 %
HCT: 49.9 % (ref 39.0–52.0)
Hemoglobin: 16.6 g/dL (ref 13.0–17.0)
Immature Granulocytes: 0 %
Lymphocytes Relative: 25 %
Lymphs Abs: 2.1 10*3/uL (ref 0.7–4.0)
MCH: 33.3 pg (ref 26.0–34.0)
MCHC: 33.3 g/dL (ref 30.0–36.0)
MCV: 100.2 fL — ABNORMAL HIGH (ref 80.0–100.0)
Monocytes Absolute: 0.3 10*3/uL (ref 0.1–1.0)
Monocytes Relative: 4 %
Neutro Abs: 5.5 10*3/uL (ref 1.7–7.7)
Neutrophils Relative %: 66 %
Platelets: 268 10*3/uL (ref 150–400)
RBC: 4.98 MIL/uL (ref 4.22–5.81)
RDW: 13.4 % (ref 11.5–15.5)
WBC: 8.3 10*3/uL (ref 4.0–10.5)
nRBC: 0 % (ref 0.0–0.2)

## 2019-05-22 LAB — BASIC METABOLIC PANEL
Anion gap: 16 — ABNORMAL HIGH (ref 5–15)
BUN: 11 mg/dL (ref 8–23)
CO2: 17 mmol/L — ABNORMAL LOW (ref 22–32)
Calcium: 9.3 mg/dL (ref 8.9–10.3)
Chloride: 105 mmol/L (ref 98–111)
Creatinine, Ser: 0.84 mg/dL (ref 0.61–1.24)
GFR calc Af Amer: >60 mL/min (ref >60)
GFR calc non Af Amer: >60 mL/min (ref >60)
Glucose, Bld: 185 mg/dL — ABNORMAL HIGH (ref 70–99)
Potassium: 3.3 mmol/L — ABNORMAL LOW (ref 3.5–5.1)
Sodium: 138 mmol/L (ref 135–145)

## 2019-05-22 LAB — ETHANOL: Alcohol, Ethyl (B): 360 mg/dL (ref <10)

## 2019-05-22 MED ORDER — LIDOCAINE HCL (PF) 1 % IJ SOLN
10.0000 mL | Freq: Once | INTRAMUSCULAR | Status: AC
  Administered 2019-05-22: 17:00:00 10 mL
  Filled 2019-05-22: qty 30

## 2019-05-22 MED ORDER — TETANUS-DIPHTH-ACELL PERTUSSIS 5-2.5-18.5 LF-MCG/0.5 IM SUSP
0.5000 mL | Freq: Once | INTRAMUSCULAR | Status: AC
  Administered 2019-05-22: 0.5 mL via INTRAMUSCULAR
  Filled 2019-05-22: qty 0.5

## 2019-05-22 MED ORDER — SODIUM CHLORIDE 0.9 % IV BOLUS
1000.0000 mL | Freq: Once | INTRAVENOUS | Status: AC
  Administered 2019-05-22: 17:00:00 1000 mL via INTRAVENOUS

## 2019-05-22 MED ORDER — POVIDONE-IODINE 10 % EX SOLN
CUTANEOUS | Status: AC
  Administered 2019-05-22: 17:00:00
  Filled 2019-05-22: qty 15

## 2019-05-22 NOTE — ED Notes (Signed)
Water given to patient 

## 2019-05-22 NOTE — ED Triage Notes (Addendum)
Pt has been drinking today. Daughter was unable to get in touch with him so she went over to his house and he stood up and fell hitting left side of brow on end of coffee table. Pt is arousable. Daughter reported to EMS he does not not talk when he drinks heavily . C collar in place

## 2019-05-22 NOTE — ED Provider Notes (Signed)
[facility] EMERGENCY DEPARTMENT Provider Note   CSN: [id number] Arrival date & time: 05/22/19  1552     History   Chief Complaint No chief complaint on file.   HPI Miguel Solis is a 68 y.o. male with history of hypertension, hyperlipidemia, gout, CVA, alcohol abuse who presents with fall while drinking.  Patient reportedly got up when his daughter came to his house and he fell and hit his face on the corner of the coffee table.  Patient's daughter reports to EMS that patient does not talk when he drinks heavily.  Per EMS he is arousable.  Patient minimally arousable for me unable to answer any questions.     HPI  Past Medical History:  Diagnosis Date  . Colon polyp 2008  . Foley catheter in place   . Gout   . Hyperlipemia   . Hyperlipidemia   . Hypertension   . Impaired fasting glucose   . Urinary retention     Patient Active Problem List   Diagnosis Date Noted  . Acute CVA (cerebrovascular accident) ([place]) 08/08/2018  . Right hemiparesis ([place]) 08/08/2018  . Alcohol withdrawal hallucinosis ([place]) 08/07/2018  . Hypomagnesemia 08/07/2018  . Polycythemia 08/07/2018  . Abnormal EKG 08/07/2018  . 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 BPH with urinary obstruction 05/04/2018  . Alcohol abuse 04/03/2017  . Prostate hypertrophy 01/10/2013  . Elevated PSA 12/05/2012  . Hyperlipemia 11/08/2012  . Essential hypertension, benign 11/08/2012  . Hyperglycemia 11/08/2012    Past Surgical History:  Procedure Laterality Date  . COLONOSCOPY N/A 08/18/2017   Procedure: COLONOSCOPY;  Surgeon: Daneil Dolin, MD;  Location: [facility];  Service: Endoscopy;  Laterality: N/A;  10:30  . KNEE SURGERY Left yrs ago   torn meniscus  . POLYPECTOMY  08/18/2017   Procedure: POLYPECTOMY;  Surgeon: Daneil Dolin, MD;  Location: [facility];  Service: Endoscopy;;  cecal x2 (cold snare)and ascending colon x2 (hot snare)  . TRANSURETHRAL RESECTION OF PROSTATE N/A  05/04/2018   Procedure: TRANSURETHRAL RESECTION OF THE PROSTATE (TURP);  Surgeon: Kathie Rhodes, MD;  Location: [facility];  Service: Urology;  Laterality: N/A;        Home Medications    Prior to Admission medications   Medication Sig Start Date End Date Taking? Authorizing Provider  aspirin 325 MG tablet Take 1 tablet (325 mg total) by mouth daily. 11/29/18   [name], MD  atorvastatin (LIPITOR) 40 MG tablet Take 1 tablet (40 mg total) by mouth daily. 11/29/18   [name], MD  lisinopril (ZESTRIL) 10 MG tablet Take 1 tablet (10 mg total) by mouth daily. 11/29/18 07/30/22  [name], MD    Family History Family History  Problem Relation Age of Onset  . Cancer Mother     Social History Social History   Tobacco Use  . Smoking status: Never Smoker  . Smokeless tobacco: Never Used  Substance Use Topics  . Alcohol use: Yes    Comment: per EMS several bottles of wine  . Drug use: No     Allergies   Patient has no known allergies.   Review of Systems Review of Systems  Unable to perform ROS: Other (EtOH intoxication)     Physical Exam Updated Vital Signs BP (!) 108/55   Pulse 73   Temp (!) 97.5 F (36.4 C) (Oral)   Resp 15   SpO2 98%   Physical Exam Vitals signs and nursing  note reviewed.  Constitutional:      General: He is not in acute distress.    Appearance: He is well-developed. He is not diaphoretic.  HENT:     Head: Normocephalic and atraumatic.     Mouth/Throat:     Pharynx: No oropharyngeal exudate.  Eyes:     General: No scleral icterus.       Right eye: No discharge.        Left eye: No discharge.     Conjunctiva/sclera: Conjunctivae normal.     Pupils: Pupils are equal, round, and reactive to light.     Comments: 2 cm laceration over the left eyebrow with swelling and tenderness  Neck:     Musculoskeletal: Normal range of motion and neck supple.     Thyroid: No thyromegaly.     Comments: C-collar in  place Cardiovascular:     Rate and Rhythm: Normal rate and regular rhythm.     Heart sounds: Normal heart sounds. No murmur. No friction rub. No gallop.   Pulmonary:     Effort: Pulmonary effort is normal. No respiratory distress.     Breath sounds: Normal breath sounds. No stridor. No wheezing or rales.  Abdominal:     General: Bowel sounds are normal. There is no distension.     Palpations: Abdomen is soft.     Tenderness: There is no abdominal tenderness. There is no guarding or rebound.  Lymphadenopathy:     Cervical: No cervical adenopathy.  Skin:    General: Skin is warm and dry.     Coloration: Skin is not pale.     Findings: No rash.  Neurological:     Mental Status: He is alert.     Coordination: Coordination normal.     Comments: Opens eyes to pain, otherwise will not respond to questions      ED Treatments / Results  Labs (all labs ordered are listed, but only abnormal results are displayed) Labs Reviewed  BASIC METABOLIC PANEL - Abnormal; Notable for the following components:      Result Value   Potassium 3.3 (*)    CO2 17 (*)    Glucose, Bld 185 (*)    Anion gap 16 (*)    All other components within normal limits  CBC WITH DIFFERENTIAL/PLATELET - Abnormal; Notable for the following components:   MCV 100.2 (*)    All other components within normal limits  ETHANOL - Abnormal; Notable for the following components:   Alcohol, Ethyl (B) 360 (*)    All other components within normal limits    EKG None  Radiology Ct Head Wo Contrast  Result Date: 05/22/2019 CLINICAL DATA:  Fall, facial strike EXAM: CT HEAD WITHOUT CONTRAST CT CERVICAL SPINE WITHOUT CONTRAST TECHNIQUE: Multidetector CT imaging of the head and cervical spine was performed following the standard protocol without intravenous contrast. Multiplanar CT image reconstructions of the cervical spine were also generated. COMPARISON:  MRI 09/20/2018, CT head 09/20/2018 FINDINGS: CT HEAD FINDINGS Brain:  Region of gliosis in the left corona radiata corresponding to diffusion abnormality on remote comparison and likely reflecting sequela of prior infarct. No evidence of acute infarction, hemorrhage, hydrocephalus, extra-axial collection or mass lesion/mass effect. Symmetric prominence of the ventricles, cisterns and sulci compatible with parenchymal volume loss. Patchy areas of white matter hypoattenuation are most compatible with chronic microvascular angiopathy. Vascular: Atherosclerotic calcification of the carotid siphons. No hyperdense vessel. Skull: Left frontal and supraorbital scalp swelling and contusion with a small lentiform hematoma  measuring up to 4 mm in maximal thickness. No subjacent calvarial fracture Sinuses/Orbits: Scattered pneumatized secretions in the frontal sinuses and ethmoids as well as some more mild mural thickening in the right maxillary sinus and sphenoid sinuses. Mastoid air cells are well aerated with pneumatization of the petrous apices. Left periorbital swelling and palpebral thickening is noted. Stranding is confined to the preseptal soft tissues. No acute orbital abnormality is seen. Other: None CT CERVICAL SPINE FINDINGS Alignment: Cervical stabilization collar is in place. No traumatic listhesis or malalignment is evident. No jumped or perched facets. Craniocervical and atlantoaxial articulations are maintained accounting for slight leftward rotation of the patient's head Skull base and vertebrae: No acute fracture. No primary bone lesion or focal pathologic process. Degenerative fusion of the right C3-4 articular facets. No acute fracture or suspicious osseous lesion. Soft tissues and spinal canal: No pre or paravertebral fluid or swelling. No visible canal hematoma. Disc levels: Diffuse intervertebral disc height loss with moderate to severe multilevel cervical spondylitic endplate changes as well as uncinate spurring and facet hypertrophic change. Findings are maximal at C6-7  with near complete effacement of intervertebral disc space as well as mild canal stenosis resulting from posterior disc osteophyte complexes at C5-6, C6-7 and C7-T1. Multilevel mild-to-moderate foraminal narrowing is present with more moderate to severe narrowing at the C5-6 level bilaterally. Upper chest: No acute abnormality in the upper chest or imaged lung apices. Other: Great vessel calcifications as well as calcifications of the cervical carotid arteries. Thyroid gland is unremarkable. IMPRESSION: 1. No acute intracranial abnormality. 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 2. Mild parenchymal volume loss and chronic microvascular ischemic white matter disease. 3. Left frontal and supraorbital scalp swelling and contusion with a small lentiform hematoma measuring up to 4 mm in maximal thickness. No subjacent calvarial fracture. 4. Left periorbital swelling and palpebral thickening. No retro septal stranding or fat infiltration. 5. No acute cervical spine fracture or traumatic listhesis. 6. Moderate to severe degenerative changes throughout the cervical spine as described above. Electronically Signed   By: Lovena Le M.D.   On: 05/22/2019 19:24   Ct Cervical Spine Wo Contrast  Result Date: 05/22/2019 CLINICAL DATA:  Fall, facial strike EXAM: CT HEAD WITHOUT CONTRAST CT CERVICAL SPINE WITHOUT CONTRAST TECHNIQUE: Multidetector CT imaging of the head and cervical spine was performed following the standard protocol without intravenous contrast. Multiplanar CT image reconstructions of the cervical spine were also generated. COMPARISON:  MRI 09/20/2018, CT head 09/20/2018 FINDINGS: CT HEAD FINDINGS Brain: Region of gliosis in the left corona radiata corresponding to diffusion abnormality on remote comparison and likely reflecting sequela of prior infarct. No evidence of acute infarction, hemorrhage, hydrocephalus, extra-axial collection or mass lesion/mass effect. Symmetric prominence of the  ventricles, cisterns and sulci compatible with parenchymal volume loss. Patchy areas of white matter hypoattenuation are most compatible with chronic microvascular angiopathy. Vascular: Atherosclerotic calcification of the carotid siphons. No hyperdense vessel. Skull: Left frontal and supraorbital scalp swelling and contusion with a small lentiform hematoma measuring up to 4 mm in maximal thickness. No subjacent calvarial fracture Sinuses/Orbits: Scattered pneumatized secretions in the frontal sinuses and ethmoids as well as some more mild mural thickening in the right maxillary sinus and sphenoid sinuses. Mastoid air cells are well aerated with pneumatization of the petrous apices. Left periorbital swelling and palpebral thickening is noted. Stranding is confined to the preseptal soft tissues. No acute orbital abnormality is seen. Other: None CT CERVICAL SPINE FINDINGS Alignment: Cervical stabilization collar  is in place. No traumatic listhesis or malalignment is evident. No jumped or perched facets. Craniocervical and atlantoaxial articulations are maintained accounting for slight leftward rotation of the patient's head Skull base and vertebrae: No acute fracture. No primary bone lesion or focal pathologic process. Degenerative fusion of the right C3-4 articular facets. No acute fracture or suspicious osseous lesion. Soft tissues and spinal canal: No pre or paravertebral fluid or swelling. No visible canal hematoma. Disc levels: Diffuse intervertebral disc height loss with moderate to severe multilevel cervical spondylitic endplate changes as well as uncinate spurring and facet hypertrophic change. Findings are maximal at C6-7 with near complete effacement of intervertebral disc space as well as mild canal stenosis resulting from posterior disc osteophyte complexes at C5-6, C6-7 and C7-T1. Multilevel mild-to-moderate foraminal narrowing is present with more moderate to severe narrowing at the C5-6 level  bilaterally. Upper chest: No acute abnormality in the upper chest or imaged lung apices. Other: Great vessel calcifications as well as calcifications of the cervical carotid arteries. Thyroid gland is unremarkable. IMPRESSION: 1. No acute intracranial abnormality. 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 2. Mild parenchymal volume loss and chronic microvascular ischemic white matter disease. 3. Left frontal and supraorbital scalp swelling and contusion with a small lentiform hematoma measuring up to 4 mm in maximal thickness. No subjacent calvarial fracture. 4. Left periorbital swelling and palpebral thickening. No retro septal stranding or fat infiltration. 5. No acute cervical spine fracture or traumatic listhesis. 6. Moderate to severe degenerative changes throughout the cervical spine as described above. Electronically Signed   By: Lovena Le M.D.   On: 05/22/2019 19:24    Procedures .Marland KitchenLaceration Repair  Date/Time: 05/22/2019 9:18 PM Performed by: Frederica Kuster, PA-C Authorized by: Frederica Kuster, PA-C   Consent:    Consent obtained:  Verbal   Consent given by:  Patient   Risks discussed:  Infection and pain Anesthesia (see MAR for exact dosages):    Anesthesia method:  Local infiltration   Local anesthetic:  Lidocaine 1% w/o epi Laceration details:    Location:  Face   Face location:  L eyebrow   Length (cm):  2 Repair type:    Repair type:  Simple Pre-procedure details:    Preparation:  Patient was prepped and draped in usual sterile fashion and imaging obtained to evaluate for foreign bodies Exploration:    Hemostasis achieved with:  Direct pressure   Wound extent: no muscle damage noted, no underlying fracture noted and no vascular damage noted     Contaminated: no   Treatment:    Area cleansed with:  Saline   Amount of cleaning:  Standard   Irrigation solution:  Sterile saline   Irrigation volume:  100   Irrigation method:  Syringe   Visualized foreign  bodies/material removed: no   Skin repair:    Repair method:  Sutures   Suture size:  5-0   Suture material:  Fast-absorbing gut   Suture technique:  Simple interrupted   Number of sutures:  4 Approximation:    Approximation:  Close Post-procedure details:    Dressing:  Non-adherent dressing   Patient tolerance of procedure:  Tolerated well, no immediate complications   (including critical care time)  Medications Ordered in ED Medications  Tdap (BOOSTRIX) injection 0.5 mL (0.5 mLs Intramuscular Given 05/22/19 1657)  lidocaine (PF) (XYLOCAINE) 1 % injection 10 mL (10 mLs Infiltration Given by Other 05/22/19 1703)  sodium chloride 0.9 % bolus 1,000 mL (0  mLs Intravenous Stopped 05/22/19 2002)  povidone-iodine (BETADINE) 10 % external solution (  Given 05/22/19 1703)     Initial Impression / Assessment and Plan / ED Course  I have reviewed the triage vital signs and the nursing notes.  Pertinent labs & imaging results that were available during my care of the patient were reviewed by me and considered in my medical decision making (see chart for details).        Patient presenting following fall while intoxicated.  Patient is an alcoholic and reportedly drinks a lot of alcohol daily.  Per daughter and wife, patient does not talk much when he has been drinking.  On arrival, patient really only opening his eyes to pain.  CT head and C-spine are negative for acute findings.  [name] updated on the phone with findings and made aware that patient will need to sober up prior to going home.  [name] also made aware of patient's moderate to severe degenerative disc disease seen on cervical spine.  She denies any knowledge of patient complaining about neck pain.  [place] updated in the ED. At shift change, patient care transferred to [facility], PA-C for reassessment and repeat exam following metabolism of EtOH and determination of disposition. Anticipate discharge if no  further injuries noted by the patient and metabolism of EtOH.  Patient also evaluated by my attending, Dr. Wyvonnia Dusky, who guided the patient's management and agrees with plan.   Final Clinical Impressions(s) / ED Diagnoses   Final diagnoses:  Fall, initial encounter  Alcohol abuse  Laceration of left eyebrow, initial encounter    ED Discharge Orders    None       Caryl Ada 05/22/19 2124    Ezequiel Essex, MD 05/23/19 [phone]

## 2019-05-22 NOTE — ED Provider Notes (Signed)
Patient signed out to me by Renae Gloss, PA-C at end of shift.  Patient is a 68 year old male who appears intoxicated on arrival.  Reported fall at home.  EtOH at 1656 is 360. Work-up tonight including CT head and C-spine are negative for acute findings.  Initial provider spoke with patient's spouse by telephone and she was also advised of findings this evening and that patient will need to sober up prior to discharge.  2300  On recheck, pt now alert and talkative.  Complaining of right shoulder pain on my exam.  Has tenderness at the right [facility] joint.  No bony deformity noted.  Will obtain XR of the shoulder and offer fluids and ambulate.     Dg Shoulder Right  Result Date: 05/22/2019 CLINICAL DATA:  Shoulder pain and injury EXAM: RIGHT SHOULDER - 2+ VIEW COMPARISON:  None. FINDINGS: No fracture or malalignment. Mild AC joint and glenohumeral degenerative change. IMPRESSION: No acute osseous abnormality Electronically Signed   By: Donavan Foil M.D.   On: 05/22/2019 23:36   Ct Head Wo Contrast  Result Date: 05/22/2019 CLINICAL DATA:  Fall, facial strike EXAM: CT HEAD WITHOUT CONTRAST CT CERVICAL SPINE WITHOUT CONTRAST TECHNIQUE: Multidetector CT imaging of the head and cervical spine was performed following the standard protocol without intravenous contrast. Multiplanar CT image reconstructions of the cervical spine were also generated. COMPARISON:  MRI 09/20/2018, CT head 09/20/2018 FINDINGS: CT HEAD FINDINGS Brain: Region of gliosis in the left corona radiata corresponding to diffusion abnormality on remote comparison and likely reflecting sequela of prior infarct. No evidence of acute infarction, hemorrhage, hydrocephalus, extra-axial collection or mass lesion/mass effect. Symmetric prominence of the ventricles, cisterns and sulci compatible with parenchymal volume loss. Patchy areas of white matter hypoattenuation are most compatible with chronic microvascular angiopathy. Vascular:  Atherosclerotic calcification of the carotid siphons. No hyperdense vessel. Skull: Left frontal and supraorbital scalp swelling and contusion with a small lentiform hematoma measuring up to 4 mm in maximal thickness. No subjacent calvarial fracture Sinuses/Orbits: Scattered pneumatized secretions in the frontal sinuses and ethmoids as well as some more mild mural thickening in the right maxillary sinus and sphenoid sinuses. Mastoid air cells are well aerated with pneumatization of the petrous apices. Left periorbital swelling and palpebral thickening is noted. Stranding is confined to the preseptal soft tissues. No acute orbital abnormality is seen. Other: None CT CERVICAL SPINE FINDINGS Alignment: Cervical stabilization collar is in place. No traumatic listhesis or malalignment is evident. No jumped or perched facets. Craniocervical and atlantoaxial articulations are maintained accounting for slight leftward rotation of the patient's head Skull base and vertebrae: No acute fracture. No primary bone lesion or focal pathologic process. Degenerative fusion of the right C3-4 articular facets. No acute fracture or suspicious osseous lesion. Soft tissues and spinal canal: No pre or paravertebral fluid or swelling. No visible canal hematoma. Disc levels: Diffuse intervertebral disc height loss with moderate to severe multilevel cervical spondylitic endplate changes as well as uncinate spurring and facet hypertrophic change. Findings are maximal at C6-7 with near complete effacement of intervertebral disc space as well as mild canal stenosis resulting from posterior disc osteophyte complexes at C5-6, C6-7 and C7-T1. Multilevel mild-to-moderate foraminal narrowing is present with more moderate to severe narrowing at the C5-6 level bilaterally. Upper chest: No acute abnormality in the upper chest or imaged lung apices. Other: Great vessel calcifications as well as calcifications of the cervical carotid arteries. Thyroid  gland is unremarkable. IMPRESSION: 1. No acute intracranial abnormality. 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 2. Mild parenchymal volume loss and chronic microvascular ischemic white matter disease. 3. Left frontal and supraorbital scalp swelling and contusion with a small lentiform hematoma measuring up to 4 mm in maximal thickness. No subjacent calvarial fracture. 4. Left periorbital swelling and palpebral thickening. No retro septal stranding or fat infiltration. 5. No acute cervical spine fracture or traumatic listhesis. 6. Moderate to severe degenerative changes throughout the cervical spine as described above. Electronically Signed   By: Lovena Le M.D.   On: 05/22/2019 19:24   Ct Cervical Spine Wo Contrast  Result Date: 05/22/2019 CLINICAL DATA:  Fall, facial strike EXAM: CT HEAD WITHOUT CONTRAST CT CERVICAL SPINE WITHOUT CONTRAST TECHNIQUE: Multidetector CT imaging of the head and cervical spine was performed following the standard protocol without intravenous contrast. Multiplanar CT image reconstructions of the cervical spine were also generated. COMPARISON:  MRI 09/20/2018, CT head 09/20/2018 FINDINGS: CT HEAD FINDINGS Brain: Region of gliosis in the left corona radiata corresponding to diffusion abnormality on remote comparison and likely reflecting sequela of prior infarct. No evidence of acute infarction, hemorrhage, hydrocephalus, extra-axial collection or mass lesion/mass effect. Symmetric prominence of the ventricles, cisterns and sulci compatible with parenchymal volume loss. Patchy areas of white matter hypoattenuation are most compatible with chronic microvascular angiopathy. Vascular: Atherosclerotic calcification of the carotid siphons. No hyperdense vessel. Skull: Left frontal and supraorbital scalp swelling and contusion with a small lentiform hematoma measuring up to 4 mm in maximal thickness. No subjacent calvarial fracture Sinuses/Orbits: Scattered pneumatized  secretions in the frontal sinuses and ethmoids as well as some more mild mural thickening in the right maxillary sinus and sphenoid sinuses. Mastoid air cells are well aerated with pneumatization of the petrous apices. Left periorbital swelling and palpebral thickening is noted. Stranding is confined to the preseptal soft tissues. No acute orbital abnormality is seen. Other: None CT CERVICAL SPINE FINDINGS Alignment: Cervical stabilization collar is in place. No traumatic listhesis or malalignment is evident. No jumped or perched facets. Craniocervical and atlantoaxial articulations are maintained accounting for slight leftward rotation of the patient's head Skull base and vertebrae: No acute fracture. No primary bone lesion or focal pathologic process. Degenerative fusion of the right C3-4 articular facets. No acute fracture or suspicious osseous lesion. Soft tissues and spinal canal: No pre or paravertebral fluid or swelling. No visible canal hematoma. Disc levels: Diffuse intervertebral disc height loss with moderate to severe multilevel cervical spondylitic endplate changes as well as uncinate spurring and facet hypertrophic change. Findings are maximal at C6-7 with near complete effacement of intervertebral disc space as well as mild canal stenosis resulting from posterior disc osteophyte complexes at C5-6, C6-7 and C7-T1. Multilevel mild-to-moderate foraminal narrowing is present with more moderate to severe narrowing at the C5-6 level bilaterally. Upper chest: No acute abnormality in the upper chest or imaged lung apices. Other: Great vessel calcifications as well as calcifications of the cervical carotid arteries. Thyroid gland is unremarkable. IMPRESSION: 1. No acute intracranial abnormality. 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 2. Mild parenchymal volume loss and chronic microvascular ischemic white matter disease. 3. Left frontal and supraorbital scalp swelling and contusion with a small  lentiform hematoma measuring up to 4 mm in maximal thickness. No subjacent calvarial fracture. 4. Left periorbital swelling and palpebral thickening. No retro septal stranding or fat infiltration. 5. No acute cervical spine fracture or traumatic listhesis. 6. Moderate to severe degenerative changes throughout the cervical spine as described above. Electronically  Signed   By: Lovena Le M.D.   On: 05/22/2019 19:24   Pt now appears sober,.  He has drank fluids and ambulated in the dept. w/o difficulty.  Appears appropriate for d/c home.       Kem Parkinson, PA-C 05/23/19 0044    Ezequiel Essex, MD 05/23/19 [phone]

## 2019-05-22 NOTE — ED Notes (Signed)
CRITICAL VALUE ALERT  Critical Value:  Ethanol 360  Date & Time Notied:  05/22/2019, 1752  Provider Notified: Cristie Hem, PA  Orders Received/Actions taken: no new orders

## 2019-05-22 NOTE — ED Notes (Signed)
Patient appears intoxicated -redirected to rest his eyes and stop pulling at IV.

## 2019-05-22 NOTE — Discharge Instructions (Addendum)
Your tetanus was updated today.  Treatment: Keep your wound dry and dressing applied until this time tomorrow. After 24 hours, you may wash with warm soapy water. Dry and apply clean dressing. Avoid antibiotic ointment as this can make the sutures dissolve too quickly. Do this daily until your sutures are removed.  Follow-up: Please follow-up with your primary care provider or return to emergency department in 7 days for suture removal if sutures are not coming out on their own with a gentle tug. Be aware of signs of infection: fever, increasing pain, redness, swelling, drainage from the area. Please call your primary care provider or return to emergency department if you develop any of these symptoms or if any of the sutures come out prior to removal. Please return to the emergency department if you develop any other new or worsening symptoms.

## 2019-05-23 NOTE — ED Notes (Signed)
Attempt to call wife to pick up patient. No answer on either provided number

## 2019-05-23 NOTE — ED Notes (Signed)
Ambulated pt walking slow and unsteady on feet assisted from staff.

## 2019-05-23 NOTE — ED Notes (Signed)
Wife on the way

## 2019-05-23 NOTE — ED Notes (Signed)
No answer by wife.

## 2019-05-29 ENCOUNTER — Other Ambulatory Visit: Payer: Self-pay

## 2019-05-29 ENCOUNTER — Ambulatory Visit (INDEPENDENT_AMBULATORY_CARE_PROVIDER_SITE_OTHER): Payer: Medicare Other | Admitting: Family Medicine

## 2019-05-29 ENCOUNTER — Encounter: Payer: Self-pay | Admitting: Family Medicine

## 2019-05-29 VITALS — BP 128/76 | Temp 97.6°F | Ht 70.0 in | Wt 177.2 lb

## 2019-05-29 DIAGNOSIS — J449 Chronic obstructive pulmonary disease, unspecified: Secondary | ICD-10-CM

## 2019-05-29 DIAGNOSIS — S0181XD Laceration without foreign body of other part of head, subsequent encounter: Secondary | ICD-10-CM

## 2019-05-29 DIAGNOSIS — R519 Headache, unspecified: Secondary | ICD-10-CM

## 2019-05-29 NOTE — Progress Notes (Signed)
   Subjective:    Patient ID: Miguel Solis, male    DOB: [birth year], 68 y.o.   MRN: [id number]  HPIpt arrives for suture removal.  Patient had a laceration above his left eyebrow.  Unfortunately fell when he had been drinking alcohol.  We talked about the importance of trying to get away from alcohol.  He denies any drainage or issues with his laceration  Pt also checking on status of pft. Never heard back about appt. New referral put in and referral coordinator will be working on this today.  Wendy/Brendale helped him regarding this issue   Review of Systems Noncontributory denies any headaches double vision nausea vomiting    Objective:   Physical Exam  Laceration well-healed small sutures present Lungs clear heart regular respiratory rate normal no murmurs      Assessment & Plan:  Sutures removed without difficulty warning signs were discussed patient was encouraged to follow-up if any ongoing troubles

## 2019-08-08 ENCOUNTER — Encounter: Payer: Self-pay | Admitting: Family Medicine

## 2019-09-11 DIAGNOSIS — Z01812 Encounter for preprocedural laboratory examination: Secondary | ICD-10-CM | POA: Diagnosis not present

## 2019-09-11 DIAGNOSIS — J449 Chronic obstructive pulmonary disease, unspecified: Secondary | ICD-10-CM | POA: Diagnosis not present

## 2019-09-11 DIAGNOSIS — Z20822 Contact with and (suspected) exposure to covid-19: Secondary | ICD-10-CM | POA: Diagnosis not present

## 2019-09-12 ENCOUNTER — Ambulatory Visit (INDEPENDENT_AMBULATORY_CARE_PROVIDER_SITE_OTHER): Payer: Medicare Other | Admitting: Family Medicine

## 2019-09-12 ENCOUNTER — Telehealth: Payer: Self-pay | Admitting: *Deleted

## 2019-09-12 ENCOUNTER — Other Ambulatory Visit: Payer: Self-pay

## 2019-09-12 DIAGNOSIS — F321 Major depressive disorder, single episode, moderate: Secondary | ICD-10-CM

## 2019-09-12 DIAGNOSIS — I10 Essential (primary) hypertension: Secondary | ICD-10-CM

## 2019-09-12 DIAGNOSIS — F1010 Alcohol abuse, uncomplicated: Secondary | ICD-10-CM | POA: Diagnosis not present

## 2019-09-12 MED ORDER — ATORVASTATIN CALCIUM 40 MG PO TABS: 40.0000 mg | ORAL_TABLET | Freq: Every day | ORAL | 1 refills | Status: DC

## 2019-09-12 MED ORDER — LISINOPRIL 10 MG PO TABS: 10.0000 mg | ORAL_TABLET | Freq: Every day | ORAL | 1 refills | Status: DC

## 2019-09-12 MED ORDER — ESCITALOPRAM OXALATE 10 MG PO TABS: 10.0000 mg | ORAL_TABLET | ORAL | 1 refills | Status: DC

## 2019-09-12 NOTE — Progress Notes (Signed)
   Subjective:  Audiovideo  Patient ID: Miguel Solis, male    DOB: [birth year], 69 y.o.   MRN: [id number]  Depression        This is a new problem.  Episode onset: started after wife passed in december.   Virtual Visit via Telephone Note  I connected with Kemarri Armentrout Littles on 09/12/19 at  2:00 PM EST by telephone and verified that I am speaking with the correct person using two identifiers.  Location: Patient: home Provider: office   I discussed the limitations, risks, security and privacy concerns of performing an evaluation and management service by telephone and the availability of in person appointments. I also discussed with the patient that there may be a patient responsible charge related to this service. The patient expressed understanding and agreed to proceed.   History of Present Illness:    Observations/Objective:   Assessment and Plan:   Follow Up Instructions:    I discussed the assessment and treatment plan with the patient. The patient was provided an opportunity to ask questions and all were answered. The patient agreed with the plan and demonstrated an understanding of the instructions.   The patient was advised to call back or seek an in-person evaluation if the symptoms worsen or if the condition fails to improve as anticipated.  I provided 30 minutes of non-face-to-face time during this encounter.  Patient claims compliance with his blood pressure medication.  Blood pressure good when checked elsewhere.  Also notes compliance with his cholesterol medication.  Main reason for visit is his protracted grief.  Lost his wife from complications of liver cirrhosis earlier this year.  Patient continues to struggle with his own health issues including chronic alcoholAbuse and chronic liver damage  Claims no suicidal thoughts.  No homicidal thoughts.  Just progressive depression and sadness since his wife's death.   See PHQ-9  Review of Systems    Psychiatric/Behavioral: Positive for depression.       Objective:   Physical Exam   Virtual     Assessment & Plan:  Impression protracted grief and substantial depression.  Discussed.  Patient claims he has not gone back to substantial drinking, though he states he is drinking "a little bit" Long discussion held regarding options.  We will go ahead and initiate Lexapro low dose rationale discussed.  Follow-up as scheduled.  Greater than 50% of this 30 minute face to face visit was spent in counseling and discussion and coordination of care regarding the above diagnosis/diagnosies

## 2019-09-12 NOTE — Telephone Encounter (Signed)
Pt states he was suppose to have two referral to pulmonary. He has appt at [facility] next Wednesday but states he never heard back about appt at [facility].

## 2019-09-13 NOTE — Telephone Encounter (Signed)
Re-sent referral to [facility], called & explained to patient, he will let us know if he doesn't hear from them so we can refer to [facility] or [facility]

## 2019-09-18 DIAGNOSIS — J449 Chronic obstructive pulmonary disease, unspecified: Secondary | ICD-10-CM | POA: Diagnosis not present

## 2019-10-09 ENCOUNTER — Encounter: Payer: Self-pay | Admitting: Family Medicine

## 2019-10-09 ENCOUNTER — Other Ambulatory Visit: Payer: Self-pay

## 2019-10-09 ENCOUNTER — Ambulatory Visit (INDEPENDENT_AMBULATORY_CARE_PROVIDER_SITE_OTHER): Payer: Medicare Other | Admitting: Family Medicine

## 2019-10-09 VITALS — BP 122/86 | Temp 97.7°F | Wt 175.0 lb

## 2019-10-09 DIAGNOSIS — E785 Hyperlipidemia, unspecified: Secondary | ICD-10-CM | POA: Diagnosis not present

## 2019-10-09 DIAGNOSIS — I10 Essential (primary) hypertension: Secondary | ICD-10-CM | POA: Diagnosis not present

## 2019-10-09 DIAGNOSIS — F1010 Alcohol abuse, uncomplicated: Secondary | ICD-10-CM

## 2019-10-09 DIAGNOSIS — F321 Major depressive disorder, single episode, moderate: Secondary | ICD-10-CM

## 2019-10-09 DIAGNOSIS — Z125 Encounter for screening for malignant neoplasm of prostate: Secondary | ICD-10-CM | POA: Diagnosis not present

## 2019-10-09 DIAGNOSIS — Z79899 Other long term (current) drug therapy: Secondary | ICD-10-CM

## 2019-10-09 MED ORDER — LISINOPRIL 10 MG PO TABS: 10.0000 mg | ORAL_TABLET | Freq: Every day | ORAL | 1 refills | Status: DC

## 2019-10-09 MED ORDER — ASPIRIN 325 MG PO TABS: 325.0000 mg | ORAL_TABLET | Freq: Every day | ORAL | 5 refills | Status: DC

## 2019-10-09 MED ORDER — KETOCONAZOLE 2 % EX CREA: 1.0000 "application " | TOPICAL_CREAM | Freq: Two times a day (BID) | CUTANEOUS | 3 refills | Status: DC

## 2019-10-09 MED ORDER — ATORVASTATIN CALCIUM 40 MG PO TABS: 40.0000 mg | ORAL_TABLET | Freq: Every day | ORAL | 1 refills | Status: DC

## 2019-10-09 MED ORDER — ESCITALOPRAM OXALATE 10 MG PO TABS: 10.0000 mg | ORAL_TABLET | ORAL | 1 refills | Status: DC

## 2019-10-09 NOTE — Progress Notes (Signed)
   Subjective:    Patient ID: Miguel Solis, male    DOB: Aug 02, 1950, 69 y.o.   MRN: [id number]  Rash This is a new problem. The current episode started in the past 7 days. The problem has been gradually worsening since onset. The affected locations include the right upper leg, left upper leg and groin. The rash is characterized by burning, itchiness and redness. He was exposed to nothing. Past treatments include nothing.    Two weeks ago itches  Uncomfortable   Has tried no prescription meds so  Far  Blood pressure medicine and blood pressure levels reviewed today with patient. Compliant with blood pressure medicine. States does not miss a dose. No obvious side effects. Blood pressure generally good when checked elsewhere. Watching salt intake.   Patient continues to take lipid medication regularly. No obvious side effects from it. Generally does not miss a dose. Prior blood work results are reviewed with patient. Patient continues to work on fat intake in diet  Patient notes ongoing compliance with antidepressant medication. No obvious side effects. Reports does not miss a dose. Overall continues to help depression substantially. No thoughts of homicide or suicide. Would like to maintain medication.   Patient states overall his grief is manageable but states medication definitely helping him.  Still working hard on trying to stay away from alcohol  Review of Systems  Skin: Positive for rash.       Objective:   Physical Exam  Alert oriented no acute distress.  Slightly disheveled appearance lungs clear.  Heart regular rate and rhythm.  Groin intertrigo rash evident      Assessment & Plan:  Impression 1 hypertension good control discussed maintain same meds  2.  Hyperlipidemia.  Status uncertain check blood work compliance discussed diet discussed  3.  Depression with substantial grief Lost his spouse this past winter.  Maintain medication.  4.  Rash intertrigo in nature  antifungal cream added  5.  Alcohol abuse.  Ongoing challenge.  Encouraged to maintain clean.  His daughter is working with him to avoid resumption of abuse  Follow-up as scheduled.  Medications refilled.  Appropriate blood work-up only

## 2019-11-13 ENCOUNTER — Ambulatory Visit: Payer: Medicare Other | Admitting: Family Medicine

## 2019-11-16 ENCOUNTER — Emergency Department (HOSPITAL_COMMUNITY): Payer: Medicare Other

## 2019-11-16 ENCOUNTER — Other Ambulatory Visit: Payer: Self-pay

## 2019-11-16 ENCOUNTER — Emergency Department (HOSPITAL_COMMUNITY)
Admission: EM | Admit: 2019-11-16 | Discharge: 2019-11-17 | Disposition: A | Discharge status: Home / Self Care | Payer: Medicare Other | Attending: Emergency Medicine | Admitting: Emergency Medicine

## 2019-11-16 ENCOUNTER — Encounter (HOSPITAL_COMMUNITY): Payer: Self-pay | Admitting: Emergency Medicine

## 2019-11-16 DIAGNOSIS — I10 Essential (primary) hypertension: Secondary | ICD-10-CM | POA: Diagnosis not present

## 2019-11-16 DIAGNOSIS — F10129 Alcohol abuse with intoxication, unspecified: Secondary | ICD-10-CM | POA: Diagnosis not present

## 2019-11-16 DIAGNOSIS — S01112A Laceration without foreign body of left eyelid and periocular area, initial encounter: Secondary | ICD-10-CM | POA: Diagnosis not present

## 2019-11-16 DIAGNOSIS — Y939 Activity, unspecified: Secondary | ICD-10-CM | POA: Insufficient documentation

## 2019-11-16 DIAGNOSIS — Z79899 Other long term (current) drug therapy: Secondary | ICD-10-CM | POA: Diagnosis not present

## 2019-11-16 DIAGNOSIS — W19XXXA Unspecified fall, initial encounter: Secondary | ICD-10-CM | POA: Insufficient documentation

## 2019-11-16 DIAGNOSIS — F10929 Alcohol use, unspecified with intoxication, unspecified: Secondary | ICD-10-CM | POA: Insufficient documentation

## 2019-11-16 DIAGNOSIS — Z9229 Personal history of other drug therapy: Secondary | ICD-10-CM | POA: Insufficient documentation

## 2019-11-16 DIAGNOSIS — S199XXA Unspecified injury of neck, initial encounter: Secondary | ICD-10-CM | POA: Diagnosis not present

## 2019-11-16 DIAGNOSIS — Y929 Unspecified place or not applicable: Secondary | ICD-10-CM | POA: Insufficient documentation

## 2019-11-16 DIAGNOSIS — Y999 Unspecified external cause status: Secondary | ICD-10-CM | POA: Diagnosis not present

## 2019-11-16 DIAGNOSIS — S0990XA Unspecified injury of head, initial encounter: Secondary | ICD-10-CM | POA: Insufficient documentation

## 2019-11-16 DIAGNOSIS — S0181XA Laceration without foreign body of other part of head, initial encounter: Secondary | ICD-10-CM

## 2019-11-16 DIAGNOSIS — R45851 Suicidal ideations: Secondary | ICD-10-CM | POA: Diagnosis not present

## 2019-11-16 MED ORDER — LIDOCAINE-EPINEPHRINE (PF) 1 %-1:200000 IJ SOLN
10.0000 mL | Freq: Once | INTRAMUSCULAR | Status: AC
  Administered 2019-11-17: 10 mL
  Filled 2019-11-16: qty 30

## 2019-11-16 NOTE — ED Notes (Signed)
Per Network engineer, patient's daughter called and stated her sister and her husband took patient to beach and when they brought him back today he was intoxicated.

## 2019-11-16 NOTE — ED Notes (Signed)
Patient's daughter Ireneo Sickel at bedside, states she is leaving and to call her when discharged. Number is [phone].

## 2019-11-16 NOTE — ED Triage Notes (Signed)
Pt brought in by EMS after he fell at home and sustained a laceration above R eyebrow. Pt has ETOH onboard.

## 2019-11-16 NOTE — ED Notes (Signed)
Suture cart and lidocaine at bedside.  

## 2019-11-16 NOTE — ED Notes (Signed)
Cleaned dried blood from patient's bilateral arms and face. Patient has approximately 1 1/2" laceration noted to left forehead. Covered with sterile gauze, bleeding controlled at this time. Skin tear noted to left hand and left elbow.

## 2019-11-17 ENCOUNTER — Emergency Department (HOSPITAL_COMMUNITY)
Admission: EM | Admit: 2019-11-17 | Discharge: 2019-11-18 | Disposition: A | Discharge status: Home / Self Care | Payer: Medicare Other | Source: Home / Self Care | Attending: Emergency Medicine | Admitting: Emergency Medicine

## 2019-11-17 ENCOUNTER — Encounter (HOSPITAL_COMMUNITY): Payer: Self-pay

## 2019-11-17 DIAGNOSIS — F10129 Alcohol abuse with intoxication, unspecified: Secondary | ICD-10-CM | POA: Diagnosis not present

## 2019-11-17 DIAGNOSIS — R404 Transient alteration of awareness: Secondary | ICD-10-CM | POA: Diagnosis not present

## 2019-11-17 DIAGNOSIS — I10 Essential (primary) hypertension: Secondary | ICD-10-CM | POA: Insufficient documentation

## 2019-11-17 DIAGNOSIS — R410 Disorientation, unspecified: Secondary | ICD-10-CM | POA: Diagnosis not present

## 2019-11-17 DIAGNOSIS — E876 Hypokalemia: Secondary | ICD-10-CM | POA: Insufficient documentation

## 2019-11-17 DIAGNOSIS — W19XXXA Unspecified fall, initial encounter: Secondary | ICD-10-CM | POA: Diagnosis not present

## 2019-11-17 DIAGNOSIS — S0990XA Unspecified injury of head, initial encounter: Secondary | ICD-10-CM | POA: Diagnosis not present

## 2019-11-17 DIAGNOSIS — Z79899 Other long term (current) drug therapy: Secondary | ICD-10-CM | POA: Insufficient documentation

## 2019-11-17 DIAGNOSIS — S01112A Laceration without foreign body of left eyelid and periocular area, initial encounter: Secondary | ICD-10-CM | POA: Diagnosis not present

## 2019-11-17 DIAGNOSIS — S199XXA Unspecified injury of neck, initial encounter: Secondary | ICD-10-CM | POA: Diagnosis not present

## 2019-11-17 DIAGNOSIS — F1020 Alcohol dependence, uncomplicated: Secondary | ICD-10-CM | POA: Insufficient documentation

## 2019-11-17 DIAGNOSIS — R0902 Hypoxemia: Secondary | ICD-10-CM | POA: Diagnosis not present

## 2019-11-17 DIAGNOSIS — F10920 Alcohol use, unspecified with intoxication, uncomplicated: Secondary | ICD-10-CM

## 2019-11-17 LAB — COMPREHENSIVE METABOLIC PANEL
ALT: 39 U/L (ref 0–44)
AST: 50 U/L — ABNORMAL HIGH (ref 15–41)
Albumin: 3.8 g/dL (ref 3.5–5.0)
Alkaline Phosphatase: 106 U/L (ref 38–126)
Anion gap: 13 (ref 5–15)
BUN: 6 mg/dL — ABNORMAL LOW (ref 8–23)
CO2: 24 mmol/L (ref 22–32)
Calcium: 8.6 mg/dL — ABNORMAL LOW (ref 8.9–10.3)
Chloride: 102 mmol/L (ref 98–111)
Creatinine, Ser: 0.64 mg/dL (ref 0.61–1.24)
GFR calc Af Amer: >60 mL/min (ref >60)
GFR calc non Af Amer: >60 mL/min (ref >60)
Glucose, Bld: 110 mg/dL — ABNORMAL HIGH (ref 70–99)
Potassium: 3.3 mmol/L — ABNORMAL LOW (ref 3.5–5.1)
Sodium: 139 mmol/L (ref 135–145)
Total Bilirubin: 1 mg/dL (ref 0.3–1.2)
Total Protein: 8.2 g/dL — ABNORMAL HIGH (ref 6.5–8.1)

## 2019-11-17 LAB — CBC WITH DIFFERENTIAL/PLATELET
Abs Immature Granulocytes: 0.05 10*3/uL (ref 0.00–0.07)
Basophils Absolute: 0.1 10*3/uL (ref 0.0–0.1)
Basophils Relative: 1 %
Eosinophils Absolute: 0.6 10*3/uL — ABNORMAL HIGH (ref 0.0–0.5)
Eosinophils Relative: 6 %
HCT: 52.7 % — ABNORMAL HIGH (ref 39.0–52.0)
Hemoglobin: 18.3 g/dL — ABNORMAL HIGH (ref 13.0–17.0)
Immature Granulocytes: 1 %
Lymphocytes Relative: 49 %
Lymphs Abs: 4.7 10*3/uL — ABNORMAL HIGH (ref 0.7–4.0)
MCH: 36.5 pg — ABNORMAL HIGH (ref 26.0–34.0)
MCHC: 34.7 g/dL (ref 30.0–36.0)
MCV: 105.2 fL — ABNORMAL HIGH (ref 80.0–100.0)
Monocytes Absolute: 0.6 10*3/uL (ref 0.1–1.0)
Monocytes Relative: 6 %
Neutro Abs: 3.6 10*3/uL (ref 1.7–7.7)
Neutrophils Relative %: 37 %
Platelets: 249 10*3/uL (ref 150–400)
RBC: 5.01 MIL/uL (ref 4.22–5.81)
RDW: 13.6 % (ref 11.5–15.5)
WBC: 9.7 10*3/uL (ref 4.0–10.5)
nRBC: 0 % (ref 0.0–0.2)

## 2019-11-17 LAB — RAPID URINE DRUG SCREEN, HOSP PERFORMED
Amphetamines: NOT DETECTED
Barbiturates: NOT DETECTED
Benzodiazepines: NOT DETECTED
Cocaine: NOT DETECTED
Opiates: NOT DETECTED
Tetrahydrocannabinol: NOT DETECTED

## 2019-11-17 LAB — URINALYSIS, ROUTINE W REFLEX MICROSCOPIC
Bacteria, UA: NONE SEEN
Bilirubin Urine: NEGATIVE
Glucose, UA: NEGATIVE mg/dL
Ketones, ur: NEGATIVE mg/dL
Leukocytes,Ua: NEGATIVE
Nitrite: NEGATIVE
Protein, ur: NEGATIVE mg/dL
Specific Gravity, Urine: 1.002 — ABNORMAL LOW (ref 1.005–1.030)
pH: 6 (ref 5.0–8.0)

## 2019-11-17 LAB — ETHANOL: Alcohol, Ethyl (B): 338 mg/dL (ref <10)

## 2019-11-17 LAB — ACETAMINOPHEN LEVEL: Acetaminophen (Tylenol), Serum: <10 ug/mL — ABNORMAL LOW (ref 10–30)

## 2019-11-17 LAB — SALICYLATE LEVEL: Salicylate Lvl: <7 mg/dL — ABNORMAL LOW (ref 7.0–30.0)

## 2019-11-17 MED ORDER — POTASSIUM CHLORIDE CRYS ER 20 MEQ PO TBCR
40.0000 meq | EXTENDED_RELEASE_TABLET | Freq: Once | ORAL | Status: AC
  Administered 2019-11-17: 40 meq via ORAL
  Filled 2019-11-17: qty 2

## 2019-11-17 MED ORDER — LORAZEPAM 1 MG PO TABS: 0.0000 mg | ORAL_TABLET | Freq: Four times a day (QID) | ORAL | Status: DC

## 2019-11-17 MED ORDER — LORAZEPAM 2 MG/ML IJ SOLN: 0.0000 mg | Freq: Two times a day (BID) | INTRAMUSCULAR | Status: DC

## 2019-11-17 MED ORDER — LORAZEPAM 1 MG PO TABS: 0.0000 mg | ORAL_TABLET | Freq: Two times a day (BID) | ORAL | Status: DC

## 2019-11-17 MED ORDER — SODIUM CHLORIDE 0.9 % IV BOLUS
1000.0000 mL | Freq: Once | INTRAVENOUS | Status: AC
  Administered 2019-11-17: 1000 mL via INTRAVENOUS

## 2019-11-17 MED ORDER — THIAMINE HCL 100 MG/ML IJ SOLN: 100.0000 mg | Freq: Every day | INTRAMUSCULAR | Status: DC

## 2019-11-17 MED ORDER — THIAMINE HCL 100 MG PO TABS: 100.0000 mg | ORAL_TABLET | Freq: Every day | ORAL | Status: DC

## 2019-11-17 MED ORDER — LORAZEPAM 2 MG/ML IJ SOLN: 0.0000 mg | Freq: Four times a day (QID) | INTRAMUSCULAR | Status: DC

## 2019-11-17 NOTE — ED Notes (Signed)
Patient urinated on self. NT Tori cleaned patient and this nurse assisted with placing patient in scrubs for transfer home.

## 2019-11-17 NOTE — [facility] Counselor (Signed)
Received TTS consult request. Spoke to Leverne Humbles, RN who said Pt is intoxicated, somnolent and currently unable to participate in assessment. [facility] staff will contact TTS at [phone] when Pt is able to participate.   Evelena Peat, [facility], [facility] Triage Specialist [phone]
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
## 2019-11-17 NOTE — Discharge Instructions (Addendum)
You were evaluated in the Emergency Department and after careful evaluation, we did not find any emergent condition requiring admission or further testing in the hospital.  Your exam/testing today is overall reassuring.  Your stitches will need to be removed in 5 to 7 days by healthcare professional.  Please return to the Emergency Department if you experience any worsening of your condition.  We encourage you to follow up with a primary care provider.  Thank you for allowing [place] to be a part of your care.

## 2019-11-17 NOTE — ED Notes (Signed)
Spoke with daughter Estill Bamberg on the phone and she agreed to come pick up patient. States she will be here in approximately 25-30 minutes.

## 2019-11-17 NOTE — ED Provider Notes (Signed)
[facility] Emergency Department Provider Note MRN:  [id number]  Arrival date & time: 11/17/19     Chief Complaint   Fall   History of Present Illness   Miguel Solis is a 69 y.o. year-old male with a history of hypertension, hyperlipidemia presenting to the ED with chief complaint of fall.  Patient explains "I just drank too much".  He thinks he fell but he is not exactly sure how it happened.  He has a laceration to his left eyebrow.  I was unable to obtain an accurate HPI, PMH, or ROS due to the patient's alcohol intoxication.  Level 5 caveat.  Review of Systems  Positive for alcohol intoxication, fall, laceration.  Patient's Health History    Past Medical History:  Diagnosis Date  . Colon polyp 2008  . Foley catheter in place   . Gout   . Hyperlipemia   . Hyperlipidemia   . Hypertension   . Impaired fasting glucose   . Urinary retention     Past Surgical History:  Procedure Laterality Date  . COLONOSCOPY N/A 08/18/2017   Procedure: COLONOSCOPY;  Surgeon: Daneil Dolin, MD;  [facility];  Service: Endoscopy;  Laterality: N/A;  10:30  . KNEE SURGERY Left yrs ago   torn meniscus  . POLYPECTOMY  08/18/2017   Procedure: POLYPECTOMY;  Surgeon: Daneil Dolin, MD;  [facility];  Service: Endoscopy;;  cecal x2 (cold snare)and ascending colon x2 (hot snare)  . TRANSURETHRAL RESECTION OF PROSTATE N/A 05/04/2018   Procedure: TRANSURETHRAL RESECTION OF THE PROSTATE (TURP);  Surgeon: Kathie Rhodes, MD;  Location: [facility];  Service: Urology;  Laterality: N/A;    Family History  Problem Relation Age of Onset  . Cancer Mother     Social History   Socioeconomic History  . Marital status: Married    Spouse name: Not on file  . Number of children: Not on file  . Years of education: Not on file  . Highest education level: Not on file  Occupational History  . Not on file  Tobacco Use  . Smoking status: Never  Smoker  . Smokeless tobacco: Never Used  Substance and Sexual Activity  . Alcohol use: Yes    Comment: per EMS several bottles of wine  . Drug use: No  . Sexual activity: Not on file  Other Topics Concern  . Not on file  Social History Narrative  . Not on file   Social Determinants of Health   Financial Resource Strain:   . Difficulty of Paying Living Expenses:   Food Insecurity:   . Worried About Charity fundraiser in the Last Year:   . Arboriculturist in the Last Year:   Transportation Needs:   . Film/video editor (Medical):   Marland Kitchen Lack of Transportation (Non-Medical):   Physical Activity:   . Days of Exercise per Week:   . Minutes of Exercise per Session:   Stress:   . Feeling of Stress :   Social Connections:   . Frequency of Communication with Friends and Family:   . Frequency of Social Gatherings with Friends and Family:   . Attends Religious Services:   . Active Member of Clubs or Organizations:   . Attends Archivist Meetings:   Marland Kitchen Marital Status:   Intimate Partner Violence:   . Fear of Current or Ex-Partner:   . Emotionally Abused:   Marland Kitchen Physically Abused:   .  Sexually Abused:      Physical Exam  There were no vitals filed for this visit.  CONSTITUTIONAL: Chronically ill-appearing, NAD NEURO:  Alert and oriented x 3, no focal deficits EYES:  eyes equal and reactive ENT/NECK:  no LAD, no JVD CARDIO: Regular rate, well-perfused, normal S1 and S2 PULM:  CTAB no wheezing or rhonchi GI/GU:  normal bowel sounds, non-distended, non-tender MSK/SPINE:  No gross deformities, no edema SKIN:  no rash; 6.5 cm laceration above the left eyebrow PSYCH:  Appropriate speech and behavior  *Additional and/or pertinent findings included in MDM below  Diagnostic and Interventional Summary    EKG Interpretation  Date/Time:    Ventricular Rate:    PR Interval:    QRS Duration:   QT Interval:    QTC Calculation:   R Axis:     Text Interpretation:          Labs Reviewed - No data to display  CT HEAD WO CONTRAST  Final Result    CT CERVICAL SPINE WO CONTRAST  Final Result      Medications  lidocaine-EPINEPHrine (XYLOCAINE-EPINEPHrine) 1 %-1:200000 (PF) injection 10 mL (has no administration in time range)     Procedures  /  Critical Care .Marland KitchenLaceration Repair  Date/Time: 11/17/2019 1:15 AM Performed by: Maudie Flakes, MD Authorized by: Maudie Flakes, MD   Consent:    Consent obtained:  Verbal   Consent given by:  Patient   Risks discussed:  Infection, nerve damage, need for additional repair, poor cosmetic result, pain, poor wound healing, retained foreign body and tendon damage Anesthesia (see MAR for exact dosages):    Anesthesia method:  Local infiltration   Local anesthetic:  Lidocaine 1% WITH epi Laceration details:    Location:  Face   Face location:  L eyebrow   Length (cm):  6.5   Depth (mm):  3 Repair type:    Repair type:  Simple Pre-procedure details:    Preparation:  Patient was prepped and draped in usual sterile fashion Exploration:    Hemostasis achieved with:  Direct pressure   Wound exploration: wound explored through full range of motion and entire depth of wound probed and visualized     Contaminated: no   Treatment:    Area cleansed with:  Saline   Amount of cleaning:  Standard Skin repair:    Repair method:  Sutures   Suture size:  5-0   Suture material:  Nylon   Suture technique:  Simple interrupted   Number of sutures:  8 Approximation:    Approximation:  Close Post-procedure details:    Dressing:  Open (no dressing)   Patient tolerance of procedure:  Tolerated well, no immediate complications    ED Course and Medical Decision Making  I have reviewed the triage vital signs, the nursing notes, and pertinent available records from the EMR.  Listed above are laboratory and imaging tests that I personally ordered, reviewed, and interpreted and then considered in my medical decision  making (see below for details).      Alcohol intoxication with unclear mechanism of injury, laceration to the left brow.  Reassuring exam, vitals, neurological exam.  No other evidence of trauma.  CT imaging is reassuring, no intracranial bleeding or cervical spinal injury.  Laceration repaired as described above.  Patient is sober enough for discharge, will be driven home by family member.    Barth Kirks. Sedonia Small, [place] [email or web address]  Final Clinical  Impressions(s) / ED Diagnoses     ICD-10-CM   1. Fall, initial encounter  W19.XXXA   2. Laceration of brow without complication, initial encounter  S01.81XA   3. Alcoholic intoxication with complication [facility])  [id number]     ED Discharge Orders    None       Discharge Instructions Discussed with and Provided to Patient:     Discharge Instructions     You were evaluated in the Emergency Department and after careful evaluation, we did not find any emergent condition requiring admission or further testing in the hospital.  Your exam/testing today is overall reassuring.  Your stitches will need to be removed in 5 to 7 days by healthcare professional.  Please return to the Emergency Department if you experience any worsening of your condition.  We encourage you to follow up with a primary care provider.  Thank you for allowing [place] to be a part of your care.      Maudie Flakes, MD 11/17/19 [phone]

## 2019-11-17 NOTE — ED Notes (Signed)
Pt ambulated to restroom and voided on himself instead of the urinal. Pt urinated on pants and abdomen. Pt back to bed.

## 2019-11-17 NOTE — ED Triage Notes (Signed)
Pt discharged last night due to laceration to left forehead due to being intoxicated. Sister found him sitting up in a chair and couldn't arose him. He told EMS he drank too much

## 2019-11-17 NOTE — ED Notes (Signed)
CRITICAL VALUE ALERT  Critical Value:  ETOH 338  Date & Time Notied:  11/17/19 @ 1912 Provider Notified[place] Orders Received/Actions taken: None yet

## 2019-11-17 NOTE — ED Notes (Signed)
Pt alert able to follow commands

## 2019-11-17 NOTE — BH Assessment (Signed)
Tele Assessment Note   Patient Name: Miguel Solis MRN: [id number] Referring Physician: Rodell Perna, PA-C Location of Patient: [facility], PA-C Location of Provider: [place] [facility] Br[facility]ggemann is an 69 y.o. widowed male who presents unaccompanied to [facility] ED due to alcohol intoxication. Pt was medically treated for a laceration on his head after falling last night and discharged. Today his sister found Pt sitting up in a chair and was unable to rouse him. Pt's blood alcohol level on arrival was 338. Pt reports he was brought to ED "because I drank too much." He reports drinking "two or three" drinks of vodka daily. He says he has been drinking daily for the past 50 years. He reports his longest period of sobriety was two months. He denies experiencing alcohol withdrawal. He says he doesn't want to stop drinking and is not interested in treatment.  Pt reports his wife of 57 years died of liver cancer 2019-07-20. Pt is very tearful when discussing her death and says he thinks about her every day. He denies current suicidal ideation or history of suicide attempts. Protective factors against suicide include good family support, relatives in the home, future orientation, and no prior attempts. Pt denies any history of intentional self-injurious behaviors. Pt denies current homicidal ideation or history of violence. Pt denies any history of auditory or visual hallucinations. He denies use of substances other than alcohol.  Pt reports he lives with his grandson. He identifies his daughters and other relatives as supportive. Pt denies legal problems. Pt reports he own firearms and they are secure.   With Pt's permission, TTS contacted his daughter, Miguel Solis [phone], for collateral information. She says Pt drinks approximately a fifth of liquor daily. She says when Pt isn't drinking he functions well but when he drinks he falls. She states Pt has not verbalized any suicidal  ideation. She says he is not physically aggressive and when he drinks he wants to isolate. She says Pt needs to be admitted to a treatment center to stop drinking. She confirms that Pt's grandson stays in the home and she says she checks in on Pt daily.  Pt is dressed in hospital scrubs and appears disheveled. He is alert and oriented x4. Pt speaks in a slurred tone, at moderate volume and normal pace. Motor behavior appears normal. Eye contact is good. Pt's mood is sad and affect is congruent with mood. Thought process is coherent and relevant. There is no indication Pt is currently responding to internal stimuli or experiencing delusional thought content. Pt was cooperative throughout assessment. He says he wants to be discharged.   Diagnosis: F10.20 Alcohol use disorder, Severe  Past Medical History:  Past Medical History:  Diagnosis Date  . Colon polyp 2008  . Foley catheter in place   . Gout   . Hyperlipemia   . Hyperlipidemia   . Hypertension   . Impaired fasting glucose   . Urinary retention     Past Surgical History:  Procedure Laterality Date  . COLONOSCOPY N/A 08/18/2017   Procedure: COLONOSCOPY;  Surgeon: Daneil Dolin, MD;  Location: [facility];  Service: Endoscopy;  Laterality: N/A;  10:30  . KNEE SURGERY Left yrs ago   torn meniscus  . POLYPECTOMY  08/18/2017   Procedure: POLYPECTOMY;  Surgeon: Daneil Dolin, MD;  Location: [facility];  Service: Endoscopy;;  cecal x2 (cold snare)and ascending colon x2 (hot snare)  . TRANSURETHRAL RESECTION OF PROSTATE  N/A 05/04/2018   Procedure: TRANSURETHRAL RESECTION OF THE PROSTATE (TURP);  Surgeon: Kathie Rhodes, MD;  Location: [facility];  Service: Urology;  Laterality: N/A;    Family History:  Family History  Problem Relation Age of Onset  . Cancer Mother     Social History:  reports that he has never smoked. He has never used smokeless tobacco. He reports current alcohol use. He reports that he does  not use drugs.  Additional Social History:  Alcohol / Drug Use Pain Medications: Denies abuse Prescriptions: Denies abuse Over the Counter: Denies abuse History of alcohol / drug use?: Yes Longest period of sobriety (when/how long): 2 months Negative Consequences of Use: Financial, Legal, Personal relationships, Work / School Withdrawal Symptoms: (Pt denies) Substance #1 Name of Substance 1: Alcohol 1 - Age of First Use: Adolescent 1 - Amount (size/oz): Approximately 1 fifth of vodka 1 - Frequency: Daily 1 - Duration: Ongoing for years 1 - Last Use / Amount: 11/17/2019, unknown  CIWA: CIWA-Ar BP: 108/75 Pulse Rate: 82 COWS:    Allergies: No Known Allergies  Home Medications: (Not in a hospital admission)   OB/GYN Status:  No LMP for male patient.  General Assessment Data Assessment unable to be completed: Yes Reason for not completing assessment: Pt intoxicated, somnolent and unable to participate. Location of Assessment: [facility] TTS Assessment: In system Is this a Tele or Face-to-Face Assessment?: Tele Assessment Is this an Initial Assessment or a Re-assessment for this encounter?: Initial Assessment Patient Accompanied by:: N/A Language Other than English: No Living Arrangements: Other (Comment)(Lives with grandson) What gender do you identify as?: Male Marital status: Widowed [place] name: NA Pregnancy Status: No Living Arrangements: Other relatives Can pt return to current living arrangement?: Yes Admission Status: Voluntary Is patient capable of signing voluntary admission?: Yes Referral Source: Self/Family/Friend Insurance type: BCBS     Crisis Care Plan Living Arrangements: Other relatives Legal Guardian: Other:(Self) Name of Psychiatrist: None Name of Therapist: None  Education Status Is patient currently in school?: No Is the patient employed, unemployed or receiving disability?: Unemployed  Risk to self with the past 6 months Suicidal Ideation:  No Has patient been a risk to self within the past 6 months prior to admission? : No Suicidal Intent: No Has patient had any suicidal intent within the past 6 months prior to admission? : No Is patient at risk for suicide?: No Suicidal Plan?: No Has patient had any suicidal plan within the past 6 months prior to admission? : No Access to Means: No What has been your use of drugs/alcohol within the last 12 months?: Pt drinking alcohol daily Previous Attempts/Gestures: No How many times?: 0 Other Self Harm Risks: Pt falls while intoxicated Triggers for Past Attempts: None known Intentional Self Injurious Behavior: None Family Suicide History: Unknown Recent stressful life event(s): Loss (Comment)(Wife died 2019-07-26) Persecutory voices/beliefs?: No Depression: Yes Depression Symptoms: Tearfulness, Despondent, Fatigue, Guilt, Loss of interest in usual pleasures Substance abuse history and/or treatment for substance abuse?: No Suicide prevention information given to non-admitted patients: Not applicable  Risk to Others within the past 6 months Homicidal Ideation: No Does patient have any lifetime risk of violence toward others beyond the six months prior to admission? : No Thoughts of Harm to Others: No Current Homicidal Intent: No Current Homicidal Plan: No Access to Homicidal Means: No Identified Victim: None History of harm to others?: No Assessment of Violence: None Noted Violent Behavior Description: No known history of violence Does patient  have access to weapons?: Yes (Comment)(Gun in the home) Criminal Charges Pending?: No Does patient have a court date: No Is patient on probation?: No  Psychosis Hallucinations: None noted Delusions: None noted  Mental Status Report Appearance/Hygiene: Disheveled, In scrubs Eye Contact: Good Motor Activity: Freedom of movement Speech: Slurred, Logical/coherent Level of Consciousness: Other (Comment), Alert(Intoxicated) Mood:  Sad Affect: Sad Anxiety Level: None Thought Processes: Coherent, Relevant Judgement: Impaired Orientation: Person, Place, Time, Situation Obsessive Compulsive Thoughts/Behaviors: None  Cognitive Functioning Concentration: Decreased Memory: Recent Intact, Remote Intact Is patient IDD: No Insight: Poor Impulse Control: Fair Appetite: Fair Have you had any weight changes? : No Change Sleep: Decreased Total Hours of Sleep: 6 Vegetative Symptoms: Decreased grooming  ADLScreening [facility] Assessment Services) Patient's cognitive ability adequate to safely complete daily activities?: Yes Patient able to express need for assistance with ADLs?: Yes Independently performs ADLs?: Yes (appropriate for developmental age)  Prior Inpatient Therapy Prior Inpatient Therapy: No  Prior Outpatient Therapy Prior Outpatient Therapy: No Does patient have an ACCT team?: No Does patient have Intensive In-House Services?  : No Does patient have [facility] services? : No Does patient have P4CC services?: No  ADL Screening (condition at time of admission) Patient's cognitive ability adequate to safely complete daily activities?: Yes Is the patient deaf or have difficulty hearing?: No Does the patient have difficulty seeing, even when wearing glasses/contacts?: No Does the patient have difficulty concentrating, remembering, or making decisions?: No Patient able to express need for assistance with ADLs?: Yes Does the patient have difficulty dressing or bathing?: No Independently performs ADLs?: Yes (appropriate for developmental age) Does the patient have difficulty walking or climbing stairs?: No Weakness of Legs: None Weakness of Arms/Hands: None  Home Assistive Devices/Equipment Home Assistive Devices/Equipment: None    Abuse/Neglect Assessment (Assessment to be complete while patient is alone) Abuse/Neglect Assessment Can Be Completed: Yes Physical Abuse: Denies Verbal Abuse: Denies Sexual  Abuse: Denies Exploitation of patient/patient's resources: Denies Self-Neglect: Denies     Regulatory affairs officer (For Healthcare) Does Patient Have a Medical Advance Directive?: No Would patient like information on creating a medical advance directive?: No - Patient declined          Disposition: Gave clinical report to Lindon Romp, FNP who said Pt does not meet criteria for inpatient dual-diagnosis treatment and is psychiatrically cleared. Notified Rodell Perna, PA-C and Leverne Humbles, RN of recommendation.  Disposition Initial Assessment Completed for this Encounter: Yes  This service was provided via telemedicine using a 2-way, interactive audio and video technology.  Names of all persons participating in this telemedicine service and their role in this encounter. Name: Mallie Mussel Mangel Role: Patient  Name: Cleta Alberts (via telephone) Role: Pt's daughter  Name: Storm Frisk, [facility] Role: TTS counselor      Orpah Greek Anson Fret, [facility], [facility] Triage Specialist [phone]  Evelena Peat 11/17/2019 10:55 PM

## 2019-11-17 NOTE — ED Notes (Signed)
Attempted to call daughter Estill Bamberg to pick patient up at discharge. No answer. Unable to leave message.

## 2019-11-18 NOTE — Discharge Instructions (Signed)
Please follow-up with outpatient resources for management of your alcoholism.  You can follow-up at your PCPs office, urgent care or come back here in 4 days to get your stitches taken out.  Return sooner if any concerning signs or symptoms develop such as fevers, persistent vomiting, loss of consciousness.

## 2019-11-22 ENCOUNTER — Other Ambulatory Visit: Payer: Self-pay

## 2019-11-22 ENCOUNTER — Telehealth: Payer: Self-pay | Admitting: Family Medicine

## 2019-11-22 ENCOUNTER — Encounter: Payer: Self-pay | Admitting: Family Medicine

## 2019-11-22 ENCOUNTER — Ambulatory Visit: Payer: Medicare Other | Admitting: Family Medicine

## 2019-11-22 VITALS — BP 148/88 | Temp 97.6°F | Wt 173.6 lb

## 2019-11-22 DIAGNOSIS — S0003XD Contusion of scalp, subsequent encounter: Secondary | ICD-10-CM

## 2019-11-22 DIAGNOSIS — F1010 Alcohol abuse, uncomplicated: Secondary | ICD-10-CM

## 2019-11-22 DIAGNOSIS — Z79899 Other long term (current) drug therapy: Secondary | ICD-10-CM | POA: Diagnosis not present

## 2019-11-22 DIAGNOSIS — E785 Hyperlipidemia, unspecified: Secondary | ICD-10-CM | POA: Diagnosis not present

## 2019-11-22 DIAGNOSIS — I10 Essential (primary) hypertension: Secondary | ICD-10-CM | POA: Diagnosis not present

## 2019-11-22 DIAGNOSIS — Z4802 Encounter for removal of sutures: Secondary | ICD-10-CM

## 2019-11-22 DIAGNOSIS — Z125 Encounter for screening for malignant neoplasm of prostate: Secondary | ICD-10-CM | POA: Diagnosis not present

## 2019-11-22 NOTE — Progress Notes (Signed)
   Subjective:    Patient ID: Miguel Solis, male    DOB: January 02, 1951, 69 y.o.   MRN: [id number]  HPIpt arrives for suture removal. Went to ED on the 15th for laceration of left brow.   Patient states some mild residual discomfort of his head after the injury.  Patient claims he tripped on his untied shoestrings  He does not mention alcohol.  Review of the emergency room report shows his alcohol level is 388.  Toxicology assessment done at that time showed no other substances  Review of Systems No shortness of breath no cough no fever no substantial headaches    Objective:   Physical Exam  Alert talkative no acute distress lungs clear heart regular rhythm.  Left eyebrow healing laceration present.  Sutures removed      Assessment & Plan:  Impression contusion with laceration and unfortunately ongoing alcohol abuse.  Please see prior notes in this regard.  Patient encouraged and important once again to stop drinking.  Patient due to get blood work.  Follow-up next week for discussion
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
## 2019-11-22 NOTE — Telephone Encounter (Signed)
Left message to return call 
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
## 2019-11-22 NOTE — Progress Notes (Signed)
wq 

## 2019-11-23 LAB — LIPID PANEL
Chol/HDL Ratio: 3.1 ratio (ref 0.0–5.0)
Cholesterol, Total: 223 mg/dL — ABNORMAL HIGH (ref 100–199)
HDL: 73 mg/dL (ref >39)
LDL Chol Calc (NIH): 132 mg/dL — ABNORMAL HIGH (ref 0–99)
Triglycerides: 102 mg/dL (ref 0–149)
VLDL Cholesterol Cal: 18 mg/dL (ref 5–40)

## 2019-11-23 LAB — BASIC METABOLIC PANEL
BUN/Creatinine Ratio: 11 (ref 10–24)
BUN: 10 mg/dL (ref 8–27)
CO2: 23 mmol/L (ref 20–29)
Calcium: 9.8 mg/dL (ref 8.6–10.2)
Chloride: 100 mmol/L (ref 96–106)
Creatinine, Ser: 0.89 mg/dL (ref 0.76–1.27)
GFR calc Af Amer: 101 mL/min/{1.73_m2} (ref >59)
GFR calc non Af Amer: 87 mL/min/{1.73_m2} (ref >59)
Glucose: 111 mg/dL — ABNORMAL HIGH (ref 65–99)
Potassium: 4.2 mmol/L (ref 3.5–5.2)
Sodium: 141 mmol/L (ref 134–144)

## 2019-11-23 LAB — HEPATIC FUNCTION PANEL
ALT: 27 IU/L (ref 0–44)
AST: 46 IU/L — ABNORMAL HIGH (ref 0–40)
Albumin: 4.3 g/dL (ref 3.8–4.8)
Alkaline Phosphatase: 137 IU/L — ABNORMAL HIGH (ref 48–121)
Bilirubin Total: 1 mg/dL (ref 0.0–1.2)
Bilirubin, Direct: 0.36 mg/dL (ref 0.00–0.40)
Total Protein: 8 g/dL (ref 6.0–8.5)

## 2019-11-23 LAB — PSA: Prostate Specific Ag, Serum: 2.1 ng/mL (ref 0.0–4.0)

## 2019-11-25 NOTE — Telephone Encounter (Addendum)
Advised daughter Nira Conn that Dr Richardson Landry stated he could not discuss her father's case with her since there is no DPR on file but he will talk with her father at his next visit.

## 2019-11-26 ENCOUNTER — Other Ambulatory Visit: Payer: Self-pay

## 2019-11-26 ENCOUNTER — Ambulatory Visit (INDEPENDENT_AMBULATORY_CARE_PROVIDER_SITE_OTHER): Payer: Medicare Other | Admitting: Family Medicine

## 2019-11-26 ENCOUNTER — Encounter: Payer: Self-pay | Admitting: Family Medicine

## 2019-11-26 VITALS — BP 134/82 | Temp 98.3°F

## 2019-11-26 DIAGNOSIS — F321 Major depressive disorder, single episode, moderate: Secondary | ICD-10-CM | POA: Diagnosis not present

## 2019-11-26 DIAGNOSIS — F1010 Alcohol abuse, uncomplicated: Secondary | ICD-10-CM | POA: Diagnosis not present

## 2019-11-26 DIAGNOSIS — I10 Essential (primary) hypertension: Secondary | ICD-10-CM

## 2019-11-26 DIAGNOSIS — E785 Hyperlipidemia, unspecified: Secondary | ICD-10-CM | POA: Diagnosis not present
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
## 2019-11-26 NOTE — Patient Instructions (Signed)
One multivitamin with folic acid one daily going forward

## 2020-01-03 ENCOUNTER — Encounter: Payer: Self-pay | Admitting: Internal Medicine

## 2020-01-03 ENCOUNTER — Ambulatory Visit: Payer: Medicare Other | Admitting: Internal Medicine

## 2020-01-03 ENCOUNTER — Other Ambulatory Visit: Payer: Self-pay

## 2020-01-03 VITALS — BP 130/90 | HR 63 | Temp 98.0°F | Ht 70.0 in | Wt 170.0 lb

## 2020-01-03 DIAGNOSIS — R0600 Dyspnea, unspecified: Secondary | ICD-10-CM | POA: Diagnosis not present

## 2020-01-03 DIAGNOSIS — I10 Essential (primary) hypertension: Secondary | ICD-10-CM | POA: Diagnosis not present

## 2020-01-03 DIAGNOSIS — R0609 Other forms of dyspnea: Secondary | ICD-10-CM

## 2020-01-03 MED ORDER — VALSARTAN 160 MG PO TABS
160.0000 mg | ORAL_TABLET | Freq: Every day | ORAL | 11 refills | Status: DC
Start: 2020-01-03 — End: 2020-12-02
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
## 2020-01-03 NOTE — Patient Instructions (Addendum)
Stop zestoril and start valsartan 160 mg one daily   To get the most out of exercise, you need to be continuously aware that you are short of breath, but never out of breath, for 30 minutes daily. As you improve, it will actually be easier for you to do the same amount of exercise  in  30 minutes so always push to the level where you are short of breath.     We will schedule for the next availabe PFT and cxr same day  and call the results  I strongly recommend your get the pfizer or moderna vaccine as soon as you can,  especially if you hear about the Delta variant in our area - both of these vaccines have proven to be safe and effective with over 150 million [place] receiving them.    Follow up with your primary provider in 6 weeks to recheck your blood pressure and manage it going forward

## 2020-01-03 NOTE — Progress Notes (Signed)
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 Pulmonary Consult    Referred by Dr Miguel Solis. Pt c/o SOB x 2 years. Needs form filled out for [facility]- ignition interlock so will need PFT.  Dyspnea:  Walks x 2 hours same pace at mall / never does more than 2 steps / no problem with yard work  Cough: denies/ though has gruff sounding  Sleep: fine flat  SABA use: none   No obvious day to day or daytime variability or assoc excess/ purulent sputum or mucus plugs or hemoptysis or cp or chest tightness, subjective wheeze or overt sinus or hb symptoms.   Sleeping  without nocturnal  or early am exacerbation  of respiratory  c/o's or need for noct saba. Also denies any obvious fluctuation of symptoms with weather or environmental changes or other aggravating or alleviating factors except as outlined above   No unusual exposure hx or h/o childhood pna/ asthma or knowledge of premature birth.  Current Allergies, Complete Past Medical History, Past Surgical History, Family History, and Social History were reviewed in [employer] record.  ROS  The following are not active complaints unless bolded Hoarseness, sore throat, dysphagia, dental problems, itching, sneezing,  nasal congestion or discharge of excess mucus or purulent secretions, ear ache,   fever, chills, sweats, unintended wt loss or wt gain, classically pleuritic or exertional cp,  orthopnea pnd or arm/hand swelling  or leg swelling, presyncope, palpitations, abdominal pain, anorexia, nausea,  vomiting, diarrhea  or change in bowel habits or change in bladder habits, change in stools or change in urine, dysuria, hematuria,  rash, arthralgias, visual complaints, headache, numbness, weakness or ataxia or problems with walking or coordination,  change in mood or  memory.           Past Medical History:  Diagnosis Date  . Colon polyp 2008  . Foley catheter in place   . Gout   . Hyperlipemia   . Hyperlipidemia   . Hypertension   . Impaired fasting glucose   . Urinary retention     Outpatient Medications Prior to Visit  Medication Sig Dispense Refill  . aspirin 325 MG tablet Take 1 tablet (325 mg total) by mouth daily. 30 tablet 5  . atorvastatin (LIPITOR) 40 MG tablet Take 1 tablet (40 mg total) by mouth daily. 90 tablet 1  . escitalopram (LEXAPRO) 10 MG tablet Take 1 tablet (10 mg total) by mouth every morning. 90 tablet 1  . ketoconazole (NIZORAL) 2 % cream Apply 1 application topically 2 (two) times daily. Apply BID to rash 30 g 3  . lisinopril (ZESTRIL) 10 MG tablet Take 1 tablet (10 mg total) by mouth daily. 90 tablet 1   No facility-administered medications prior to visit.     Objective:     BP 130/90 (BP Location: Left Arm, Cuff Size: Normal)   Pulse 63   Temp 98 F (36.7 C) (Oral)   Ht  5\' 10"  (1.778 m)   Wt 170 lb (77.1 kg)   SpO2 98% Comment: on RA  BMI 24.39 kg/m   SpO2: 98 % (on RA)   amb thin wm wm gruff voice texture    HEENT : pt wearing mask not removed for exam due to covid -19 concerns.    NECK :  without JVD/Nodes/TM/ nl carotid upstrokes bilaterally   LUNGS: no acc muscle use,  Nl contour chest which is clear to A and P bilaterally without cough on insp or exp maneuvers   CV:  RRR  no s3 or murmur or increase in P2, and no edema   ABD:  soft and nontender with nl inspiratory excursion in the supine position. No bruits or organomegaly appreciated, bowel sounds nl  MS:  Nl gait/ ext warm without deformities, calf tenderness, cyanosis or  clubbing No obvious joint restrictions   SKIN: warm and dry without lesions    NEURO:  alert, approp, nl sensorium with  no motor or cerebellar deficits apparent.   cxr rec but pt wants to wait until returns for pfts      Assessment   DOE (dyspnea on exertion) Onset was when tried to use ignition lockout - Spirometry 09/18/2019  FEV1 2.65 (90%)  Ratio 0.67 with very mild curvature on f/v loop with variable insp truncatoin - 01/03/2020 rec trial off acei and repeat cxr/ pfts in 2 weeeks    No reason I see why he can't use the ignition lockout based on today's eval but I suspect he has ACEi related insp flow issues and rec trial off acei then repeat the pfts to complete the w/u   Discussed in detail all the  indications, usual  risks and alternatives  relative to the benefits with patient who agrees to proceed with Rx as outlined.        Essential hypertension Try off acei 01/03/2020 due to hoarseness and atypical sob  In the best review of chronic cough to date ( NEJM 2016 375 5035-4656) ,  ACEi are now felt to cause cough in up to  20% of pts which is a 4 fold increase from previous reports and does not include the variety of non-specific complaints we see in pulmonary clinic in pts on ACEi but previously attributed to another dx like  Copd/asthma and  include PNDS, throat and chest congestion, "bronchitis", unexplained dyspnea and noct "strangling" sensations, and hoarseness, but also  atypical /refractory GERD symptoms like dysphagia and "bad heartburn"   The only way I know  to prove this is not an "ACEi Case" is a trial off ACEi x a minimum of 6 weeks then regroup.   Try valsartan 160 mg daily and f/u pcp  Medical decision making was a moderate level of complexity in this case because of  More than one  chronic conditions /diagnoses requiring extra time for  H and P, chart review, counseling,  and generating customized AVS unique to this office visit and charting.   Each maintenance  medication was reviewed in detail including emphasizing most importantly the difference between maintenance and prns and under what circumstances the prns are to be triggered using an action plan format where appropriate. Please see avs for details which were reviewed in writing by both me and my nurse and patient given a written copy highlighted where appropriate with yellow highlighter for the patient's continued care at home along with an updated version of their medications.  Patient was asked to maintain  medication reconciliation by comparing this list to the actual medications being used at home and to contact this office right away if there is a conflict or discrepancy.        Christinia Gully, MD 01/03/2020

## 2020-01-03 NOTE — Assessment & Plan Note (Signed)
Try off acei 01/03/2020 due to hoarseness and atypical sob  In the best review of chronic cough to date ( NEJM 2016 375 9191-6606) ,  ACEi are now felt to cause cough in up to  20% of pts which is a 4 fold increase from previous reports and does not include the variety of non-specific complaints we see in pulmonary clinic in pts on ACEi but previously attributed to another dx like  Copd/asthma and  include PNDS, throat and chest congestion, "bronchitis", unexplained dyspnea and noct "strangling" sensations, and hoarseness, but also  atypical /refractory GERD symptoms like dysphagia and "bad heartburn"   The only way I know  to prove this is not an "ACEi Case" is a trial off ACEi x a minimum of 6 weeks then regroup.   Try valsartan 160 mg daily and f/u pcp  Medical decision making was a moderate level of complexity in this case because of  More than one  chronic conditions /diagnoses requiring extra time for  H and P, chart review, counseling,  and generating customized AVS unique to this office visit and charting.   Each maintenance medication was reviewed in detail including emphasizing most importantly the difference between maintenance and prns and under what circumstances the prns are to be triggered using an action plan format where appropriate. Please see avs for details which were reviewed in writing by both me and my nurse and patient given a written copy highlighted where appropriate with yellow highlighter for the patient's continued care at home along with an updated version of their medications.  Patient was asked to maintain medication reconciliation by comparing this list to the actual medications being used at home and to contact this office right away if there is a conflict or discrepancy.

## 2020-01-20 ENCOUNTER — Other Ambulatory Visit (HOSPITAL_COMMUNITY)
Admission: RE | Admit: 2020-01-20 | Discharge: 2020-01-20 | Disposition: A | Discharge status: Home / Self Care | Payer: Medicare Other | Source: Ambulatory Visit | Attending: Internal Medicine | Admitting: Internal Medicine

## 2020-01-20 ENCOUNTER — Other Ambulatory Visit: Payer: Medicare Other

## 2020-01-20 ENCOUNTER — Other Ambulatory Visit: Payer: Self-pay

## 2020-01-20 DIAGNOSIS — Z20822 Contact with and (suspected) exposure to covid-19: Secondary | ICD-10-CM | POA: Diagnosis not present

## 2020-01-20 DIAGNOSIS — Z01812 Encounter for preprocedural laboratory examination: Secondary | ICD-10-CM | POA: Diagnosis not present

## 2020-01-21 LAB — SARS CORONAVIRUS 2 (TAT 6-24 HRS): SARS Coronavirus 2: NEGATIVE

## 2020-01-23 ENCOUNTER — Ambulatory Visit (HOSPITAL_COMMUNITY)
Admission: RE | Admit: 2020-01-23 | Discharge: 2020-01-23 | Disposition: A | Discharge status: Home / Self Care | Payer: Medicare Other | Source: Ambulatory Visit | Attending: Internal Medicine | Admitting: Internal Medicine

## 2020-01-23 ENCOUNTER — Other Ambulatory Visit: Payer: Self-pay

## 2020-01-23 DIAGNOSIS — R0600 Dyspnea, unspecified: Secondary | ICD-10-CM | POA: Diagnosis not present

## 2020-01-23 DIAGNOSIS — R0609 Other forms of dyspnea: Secondary | ICD-10-CM

## 2020-01-23 LAB — PULMONARY FUNCTION TEST
DL/VA % pred: 93 %
DL/VA: 3.81 ml/min/mmHg/L
DLCO unc % pred: 93 %
DLCO unc: 24.39 ml/min/mmHg
FEF 25-75 Post: 2.5 L/sec
FEF 25-75 Pre: 1.46 L/sec
FEF2575-%Change-Post: 71 %
FEF2575-%Pred-Post: 99 %
FEF2575-%Pred-Pre: 57 %
FEV1-%Change-Post: 18 %
FEV1-%Pred-Post: 83 %
FEV1-%Pred-Pre: 70 %
FEV1-Post: 2.76 L
FEV1-Pre: 2.33 L
FEV1FVC-%Change-Post: 0 %
FEV1FVC-%Pred-Pre: 90 %
FEV6-%Change-Post: 17 %
FEV6-%Pred-Post: 94 %
FEV6-%Pred-Pre: 80 %
FEV6-Post: 3.99 L
FEV6-Pre: 3.41 L
FEV6FVC-%Change-Post: 0 %
FEV6FVC-%Pred-Post: 102 %
FEV6FVC-%Pred-Pre: 103 %
FVC-%Change-Post: 18 %
FVC-%Pred-Post: 92 %
FVC-%Pred-Pre: 78 %
FVC-Post: 4.14 L
FVC-Pre: 3.5 L
Post FEV1/FVC ratio: 67 %
Post FEV6/FVC ratio: 97 %
Pre FEV1/FVC ratio: 67 %
Pre FEV6/FVC Ratio: 97 %
RV % pred: 162 %
RV: 3.95 L
TLC % pred: 106 %
TLC: 7.46 L

## 2020-01-23 MED ORDER — ALBUTEROL SULFATE (2.5 MG/3ML) 0.083% IN NEBU
2.5000 mg | INHALATION_SOLUTION | Freq: Once | RESPIRATORY_TRACT | Status: AC
  Administered 2020-01-23: 2.5 mg via RESPIRATORY_TRACT

## 2020-01-27 NOTE — Progress Notes (Signed)
Tried calling the pt- someone answered and then hung up, WCB.

## 2020-01-30 NOTE — Progress Notes (Signed)
LMTCB

## 2020-02-04 NOTE — Progress Notes (Signed)
Attempted to call patient, got the voicemail but was unable to leave a message due to it saying it was full.

## 2020-02-04 NOTE — Progress Notes (Signed)
Attempted to call patient and was unable to leave a voicemail message to call back due to it stating that it was full.

## 2020-02-13 ENCOUNTER — Encounter: Payer: Self-pay | Admitting: *Deleted

## 2020-02-13 NOTE — Progress Notes (Signed)
Letter mailed asking pt to call for results

## 2020-03-09 ENCOUNTER — Other Ambulatory Visit: Payer: Self-pay

## 2020-03-09 ENCOUNTER — Emergency Department (HOSPITAL_COMMUNITY): Payer: Medicare Other

## 2020-03-09 ENCOUNTER — Emergency Department (HOSPITAL_COMMUNITY)
Admission: EM | Admit: 2020-03-09 | Discharge: 2020-03-10 | Disposition: A | Discharge status: Home / Self Care | Payer: Medicare Other | Attending: Emergency Medicine | Admitting: Emergency Medicine

## 2020-03-09 ENCOUNTER — Encounter (HOSPITAL_COMMUNITY): Payer: Self-pay | Admitting: Emergency Medicine

## 2020-03-09 DIAGNOSIS — R5383 Other fatigue: Secondary | ICD-10-CM | POA: Diagnosis present

## 2020-03-09 DIAGNOSIS — Z7982 Long term (current) use of aspirin: Secondary | ICD-10-CM | POA: Diagnosis not present

## 2020-03-09 DIAGNOSIS — F10120 Alcohol abuse with intoxication, uncomplicated: Secondary | ICD-10-CM | POA: Insufficient documentation

## 2020-03-09 DIAGNOSIS — Y908 Blood alcohol level of 240 mg/100 ml or more: Secondary | ICD-10-CM | POA: Diagnosis not present

## 2020-03-09 DIAGNOSIS — G319 Degenerative disease of nervous system, unspecified: Secondary | ICD-10-CM | POA: Diagnosis not present

## 2020-03-09 DIAGNOSIS — I10 Essential (primary) hypertension: Secondary | ICD-10-CM | POA: Diagnosis not present

## 2020-03-09 DIAGNOSIS — R404 Transient alteration of awareness: Secondary | ICD-10-CM | POA: Diagnosis not present

## 2020-03-09 DIAGNOSIS — R4182 Altered mental status, unspecified: Secondary | ICD-10-CM | POA: Diagnosis not present

## 2020-03-09 DIAGNOSIS — R569 Unspecified convulsions: Secondary | ICD-10-CM | POA: Insufficient documentation

## 2020-03-09 DIAGNOSIS — I639 Cerebral infarction, unspecified: Secondary | ICD-10-CM | POA: Diagnosis not present

## 2020-03-09 DIAGNOSIS — Z8673 Personal history of transient ischemic attack (TIA), and cerebral infarction without residual deficits: Secondary | ICD-10-CM | POA: Diagnosis not present

## 2020-03-09 DIAGNOSIS — F10920 Alcohol use, unspecified with intoxication, uncomplicated: Secondary | ICD-10-CM

## 2020-03-09 DIAGNOSIS — I6782 Cerebral ischemia: Secondary | ICD-10-CM | POA: Diagnosis not present

## 2020-03-09 DIAGNOSIS — Z79899 Other long term (current) drug therapy: Secondary | ICD-10-CM | POA: Diagnosis not present

## 2020-03-09 DIAGNOSIS — R531 Weakness: Secondary | ICD-10-CM | POA: Diagnosis not present

## 2020-03-09 DIAGNOSIS — R0689 Other abnormalities of breathing: Secondary | ICD-10-CM | POA: Diagnosis not present

## 2020-03-09 DIAGNOSIS — I6389 Other cerebral infarction: Secondary | ICD-10-CM | POA: Diagnosis not present

## 2020-03-09 DIAGNOSIS — R29818 Other symptoms and signs involving the nervous system: Secondary | ICD-10-CM | POA: Diagnosis not present

## 2020-03-09 DIAGNOSIS — R4020 Unspecified coma: Secondary | ICD-10-CM | POA: Diagnosis not present

## 2020-03-09 LAB — CBC
HCT: 46.8 % (ref 39.0–52.0)
Hemoglobin: 17 g/dL (ref 13.0–17.0)
MCH: 39 pg — ABNORMAL HIGH (ref 26.0–34.0)
MCHC: 36.3 g/dL — ABNORMAL HIGH (ref 30.0–36.0)
MCV: 107.3 fL — ABNORMAL HIGH (ref 80.0–100.0)
Platelets: 185 10*3/uL (ref 150–400)
RBC: 4.36 MIL/uL (ref 4.22–5.81)
RDW: 14.1 % (ref 11.5–15.5)
WBC: 12.5 10*3/uL — ABNORMAL HIGH (ref 4.0–10.5)
nRBC: 0 % (ref 0.0–0.2)

## 2020-03-09 LAB — DIFFERENTIAL
Abs Immature Granulocytes: 0.08 10*3/uL — ABNORMAL HIGH (ref 0.00–0.07)
Basophils Absolute: 0.1 10*3/uL (ref 0.0–0.1)
Basophils Relative: 1 %
Eosinophils Absolute: 0.2 10*3/uL (ref 0.0–0.5)
Eosinophils Relative: 2 %
Immature Granulocytes: 1 %
Lymphocytes Relative: 33 %
Lymphs Abs: 4.1 10*3/uL — ABNORMAL HIGH (ref 0.7–4.0)
Monocytes Absolute: 1.2 10*3/uL — ABNORMAL HIGH (ref 0.1–1.0)
Monocytes Relative: 10 %
Neutro Abs: 6.8 10*3/uL (ref 1.7–7.7)
Neutrophils Relative %: 53 %

## 2020-03-09 LAB — APTT: aPTT: 33 seconds (ref 24–36)

## 2020-03-09 LAB — PROTIME-INR
INR: 1.1 (ref 0.8–1.2)
Prothrombin Time: 13.3 seconds (ref 11.4–15.2)

## 2020-03-09 NOTE — Consult Note (Signed)
[facility] Consult Services   Date of Service:   03/09/2020 23:39:03  Impression:     .  G93.49 - Encephalopathy Multifactorial  Comments/Sign-Out: 69 yr old male with sig PMHx of HTN, HLD, hx of CVA with right side residual deficits, per record review hx of seizures ? ethanol withdrawal, ethanol abuse, drinks liquor LKN unknown. Present with presents with AMS and aphasia. NIHSS = 10-12. patient lethargic some left gaze preference with left nasolabial fold droop, left facial droop , follows some commands , is able to state name and squeeze hands and move legs. he seems weaker on left, CT head non acute. out of iv altpelase time window. clinically not suspecting a LVO. chief differential include seizure ( left gaze preference aphasia and left facial droop) vs possible ischemic stroke. advise admit for MRI brain , stroke work-up and EEG, would start ASA if he has not been taking.  Metrics: Last Known Well: Unknown [facility] Notification Time: 03/09/2020 23:39:03 Arrival Time: 03/09/2020 23:15:00 Stamp Time: 03/09/2020 23:39:03 Time First Login Attempt: 03/09/2020 23:45:57 Symptoms: ams . NIHSS Start Assessment Time: 03/09/2020 23:48:14 Patient is not a candidate for Thrombolytic. Thrombolytic Medical Decision: 03/09/2020 23:48:05 Patient was not deemed candidate for Thrombolytic because of following reasons: Last Well Known Above 4.5 Hours.  CT head showed no acute hemorrhage or acute core infarct.  ED Physician notified of diagnostic impression and management plan on 03/10/2020 00:24:34  Advanced Imaging: Advanced Imaging Not Recommended because:  Clinical Presentation is not Suggestive of LVO and NIHSS is <6   Our recommendations are outlined below.  Recommendations:     .  Activate Stroke Protocol Admission/Order Set     .  Stroke/Telemetry Floor     .  Neuro Checks     .  Bedside Swallow Eval     .  DVT Prophylaxis     .  IV  Fluids, Normal Saline     .  Head of Bed 30 Degrees     .  Euglycemia and Avoid Hyperthermia (PRN Acetaminophen)     .  Initiate Aspirin 81 MG Daily  Routine Consultation with [facility] for Follow up Care  Sign Out:     .  Discussed with Emergency Department Provider - attempted calling, will call back    ------------------------------------------------------------------------------  History of Present Illness: Patient is a 69 year old Male.  Patient was brought by EMS for symptoms of ams .  69 yr old male with sig PMHx of HTN HLD hx of CVA with right side residual deficits, per record review hs of seizures ? ethanol withdrawal, s ethanol abuse, drinks liquor presents with AMS and aphasia. per nursing staff unclear Last known normal. not on [facility]   Past Medical History:     . Hypertension     . Hyperlipidemia     . Stroke     . There is NO history of Diabetes Mellitus     . There is NO history of Atrial Fibrillation     . There is NO history of Coronary Artery Disease     . ethanol use  Social History: Smoking: No Alcohol Use: Yes Drug Use: No  Anticoagulant use:  No  Antiplatelet use: No    Examination: BP(100/67), Pulse(101), Blood Glucose(170) 1A: Level of Consciousness - Arouses to minor stimulation + 1 1B: Ask Month and Age - Could Not Answer Either Question Correctly + 2 1C: Blink Eyes & Squeeze Hands - Performs Both Tasks + 0 2:  Test Horizontal Extraocular Movements - Partial Gaze Palsy: Can Be Overcome + 1 3: Test Visual Fields - No Visual Loss + 0 4: Test Facial Palsy (Use Grimace if Obtunded) - Minor paralysis (flat nasolabial fold, smile asymmetry) + 1 5A: Test Left Arm Motor Drift - Drift, but doesn't hit bed + 1 5B: Test Right Arm Motor Drift - Drift, but doesn't hit bed + 1 6A: Test Left Leg Motor Drift - No Drift for 5 Seconds + 0 6B: Test Right Leg Motor Drift - No Drift for 5 Seconds + 0 7: Test Limb Ataxia (FNF/Heel-Shin) - No Ataxia +  0 8: Test Sensation - Normal; No sensory loss + 0 9: Test Language/Aphasia - Severe Aphasia: Fragmentary Expression, Inference Needed, Cannot Identify Materials + 2 10: Test Dysarthria - Mute/Anarthric + 2 11: Test Extinction/Inattention - No abnormality + 0  NIHSS Score: 11  Pre-Morbid Modified Rankin Scale: 2 Points = Slight disability; unable to carry out all previous activities, but able to look after own affairs without assistance   Patient/Family was informed the Neurology Consult would occur via TeleHealth consult by way of interactive audio and video telecommunications and consented to receiving care in this manner.   Patient is being evaluated for possible acute neurologic impairment and high probability of imminent or life-threatening deterioration. I spent total of 30 minutes providing care to this patient, including time for face to face visit via telemedicine, review of medical records, imaging studies and discussion of findings with providers, the patient and/or family.   Dr Annabelle Harman   [facility] [phone]  Case [id number]

## 2020-03-09 NOTE — ED Provider Notes (Addendum)
[facility] EMERGENCY DEPARTMENT Provider Note   CSN: [id number] Arrival date & time: 03/09/20  2315  An emergency department physician performed an initial assessment on this suspected stroke patient at 2317.Time seen on arrival  History Chief Complaint  Patient presents with  . Code Stroke   Level 5 caveat for altered mental status  Miguel Solis is a 69 y.o. male.  HPI EMS reports they were called for patient seizing in truck.  They had to break a window to extricate him.  They state he was lethargic and they thought he had a left facial droop.  He was not verbal.  His CBG was 196 and blood pressure on arrival to the ED was 118/74.  The last time he been seen normal by family was about 2 hours prior to that.  Patient does have a history of alcohol abuse.  When I talk to patient he opens his eyes.  He does try to tell me his name but his speech is very slurred.  PCP Erven Colla, DO     Past Medical History:  Diagnosis Date  . Colon polyp 2008  . Foley catheter in place   . Gout   . Hyperlipemia   . Hyperlipidemia   . Hypertension   . Impaired fasting glucose   . Urinary retention     Patient Active Problem List   Diagnosis Date Noted  . DOE (dyspnea on exertion) 01/03/2020  . Acute CVA (cerebrovascular accident) ([place]) 08/08/2018  . Right hemiparesis ([place]) 08/08/2018  . Alcohol withdrawal hallucinosis ([place]) 08/07/2018  . Hypomagnesemia 08/07/2018  . Polycythemia 08/07/2018  . Abnormal EKG 08/07/2018  . 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 BPH with urinary obstruction 05/04/2018  . Alcohol abuse 04/03/2017  . Prostate hypertrophy 01/10/2013  . Elevated PSA 12/05/2012  . Hyperlipemia 11/08/2012  . Essential hypertension 11/08/2012  . Hyperglycemia 11/08/2012    Past Surgical History:  Procedure Laterality Date  . COLONOSCOPY N/A 08/18/2017   Procedure: COLONOSCOPY;  Surgeon: Daneil Dolin, MD;  Location: [facility];  Service:  Endoscopy;  Laterality: N/A;  10:30  . KNEE SURGERY Left yrs ago   torn meniscus  . POLYPECTOMY  08/18/2017   Procedure: POLYPECTOMY;  Surgeon: Daneil Dolin, MD;  Location: [facility];  Service: Endoscopy;;  cecal x2 (cold snare)and ascending colon x2 (hot snare)  . TRANSURETHRAL RESECTION OF PROSTATE N/A 05/04/2018   Procedure: TRANSURETHRAL RESECTION OF THE PROSTATE (TURP);  Surgeon: Kathie Rhodes, MD;  Location: [facility];  Service: Urology;  Laterality: N/A;       Family History  Problem Relation Age of Onset  . Cancer Mother     Social History   Tobacco Use  . Smoking status: Never Smoker  . Smokeless tobacco: Never Used  Vaping Use  . Vaping Use: Never used  Substance Use Topics  . Alcohol use: Yes    Comment: per EMS several bottles of wine  . Drug use: No    Home Medications Prior to Admission medications   Medication Sig Start Date End Date Taking? Authorizing Provider  aspirin 325 MG tablet Take 1 tablet (325 mg total) by mouth daily. 10/09/19   [name], MD  atorvastatin (LIPITOR) 40 MG tablet Take 1 tablet (40 mg total) by mouth daily. 10/09/19   [name], MD  escitalopram (LEXAPRO) 10 MG tablet Take 1 tablet (10 mg total) by mouth every morning. 10/09/19   Baltazar Apo  S, MD  ketoconazole (NIZORAL) 2 % cream Apply 1 application topically 2 (two) times daily. Apply BID to rash 10/09/19   [name], MD  levETIRAcetam (KEPPRA) 500 MG tablet Take 1 tablet (500 mg total) by mouth 2 (two) times daily. 03/10/20   Rolland Porter, MD  valsartan (DIOVAN) 160 MG tablet Take 1 tablet (160 mg total) by mouth daily. 01/03/20   Tanda Rockers, MD    Allergies    Patient has no known allergies.  Review of Systems   Review of Systems  Unable to perform ROS: Mental status change    Physical Exam Updated Vital Signs BP (!) 89/70   Pulse 93   Temp 97.9 F (36.6 C) (Oral)   Resp (!) 27   Ht 5\' 10"  (1.778 m)   Wt 77.3 kg   SpO2 98%    BMI 24.45 kg/m   Physical Exam Vitals and nursing note reviewed.  Constitutional:      Appearance: Normal appearance. He is normal weight.     Comments: Patient is lying on stretcher with eyes closed  HENT:     Head: Normocephalic and atraumatic.     Right Ear: External ear normal.     Left Ear: External ear normal.  Eyes:     Extraocular Movements: Extraocular movements intact.     Conjunctiva/sclera: Conjunctivae normal.     Pupils: Pupils are equal, round, and reactive to light.  Cardiovascular:     Rate and Rhythm: Normal rate and regular rhythm.     Pulses: Normal pulses.     Heart sounds: Normal heart sounds.  Pulmonary:     Effort: Pulmonary effort is normal. No respiratory distress.     Breath sounds: Normal breath sounds.  Musculoskeletal:        General: No deformity.     Cervical back: Normal range of motion.  Skin:    General: Skin is warm and dry.  Neurological:     Comments: Patient does not grip my fingers on command, he does not smile on command  Psychiatric:     Comments: Unable to assess     ED Results / Procedures / Treatments   Labs (all labs ordered are listed, but only abnormal results are displayed) Results for orders placed or performed during the hospital encounter of 03/09/20  Ethanol  Result Value Ref Range   Alcohol, Ethyl (B) 380 (HH) <10 mg/dL  Protime-INR  Result Value Ref Range   Prothrombin Time 13.3 11.4 - 15.2 seconds   INR 1.1 0.8 - 1.2  APTT  Result Value Ref Range   aPTT 33 24 - 36 seconds  CBC  Result Value Ref Range   WBC 12.5 (H) 4.0 - 10.5 K/uL   RBC 4.36 4.22 - 5.81 MIL/uL   Hemoglobin 17.0 13.0 - 17.0 g/dL   HCT 46.8 39 - 52 %   MCV 107.3 (H) 80.0 - 100.0 fL   MCH 39.0 (H) 26.0 - 34.0 pg   MCHC 36.3 (H) 30.0 - 36.0 g/dL   RDW 14.1 11.5 - 15.5 %   Platelets 185 150 - 400 K/uL   nRBC 0.0 0.0 - 0.2 %  Differential  Result Value Ref Range   Neutrophils Relative % 53 %   Neutro Abs 6.8 1.7 - 7.7 K/uL   Lymphocytes  Relative 33 %   Lymphs Abs 4.1 (H) 0.7 - 4.0 K/uL   Monocytes Relative 10 %   Monocytes Absolute 1.2 (H) 0 - 1  K/uL   Eosinophils Relative 2 %   Eosinophils Absolute 0.2 0 - 0 K/uL   Basophils Relative 1 %   Basophils Absolute 0.1 0 - 0 K/uL   Immature Granulocytes 1 %   Abs Immature Granulocytes 0.08 (H) 0.00 - 0.07 K/uL  Comprehensive metabolic panel  Result Value Ref Range   Sodium 133 (L) 135 - 145 mmol/L   Potassium 2.8 (L) 3.5 - 5.1 mmol/L   Chloride 94 (L) 98 - 111 mmol/L   CO2 23 22 - 32 mmol/L   Glucose, Bld 170 (H) 70 - 99 mg/dL   BUN 8 8 - 23 mg/dL   Creatinine, Ser 0.72 0.61 - 1.24 mg/dL   Calcium 8.8 (L) 8.9 - 10.3 mg/dL   Total Protein 7.9 6.5 - 8.1 g/dL   Albumin 3.4 (L) 3.5 - 5.0 g/dL   AST 130 (H) 15 - 41 U/L   ALT 61 (H) 0 - 44 U/L   Alkaline Phosphatase 129 (H) 38 - 126 U/L   Total Bilirubin 2.0 (H) 0.3 - 1.2 mg/dL   GFR calc non Af Amer >60 >60 mL/min   GFR calc Af Amer >60 >60 mL/min   Anion gap 16 (H) 5 - 15  Urine rapid drug screen (hosp performed)  Result Value Ref Range   Opiates NONE DETECTED NONE DETECTED   Cocaine NONE DETECTED NONE DETECTED   Benzodiazepines NONE DETECTED NONE DETECTED   Amphetamines NONE DETECTED NONE DETECTED   Tetrahydrocannabinol NONE DETECTED NONE DETECTED   Barbiturates NONE DETECTED NONE DETECTED  Urinalysis, Routine w reflex microscopic Urine, Clean Catch  Result Value Ref Range   Color, Urine YELLOW YELLOW   APPearance CLEAR CLEAR   Specific Gravity, Urine 1.002 (L) 1.005 - 1.030   pH 6.0 5.0 - 8.0   Glucose, UA NEGATIVE NEGATIVE mg/dL   Hgb urine dipstick NEGATIVE NEGATIVE   Bilirubin Urine NEGATIVE NEGATIVE   Ketones, ur NEGATIVE NEGATIVE mg/dL   Protein, ur NEGATIVE NEGATIVE mg/dL   Nitrite NEGATIVE NEGATIVE   Leukocytes,Ua NEGATIVE NEGATIVE  Magnesium  Result Value Ref Range   Magnesium 2.1 1.7 - 2.4 mg/dL   Laboratory interpretation all normal except alcohol intoxication, leukocytosis, hypokalemia,  hyperglycemia, low sodium and low chloride,    EKG EKG Interpretation  Date/Time:  Monday March 09 2020 23:31:43 EDT Ventricular Rate:  101 PR Interval:    QRS Duration: 89 QT Interval:  385 QTC Calculation: 500 R Axis:   66 Text Interpretation: Sinus tachycardia Probable inferior infarct, old No significant change since last tracing 20 Sep 2018 Confirmed by Rolland Porter [phone]) on 03/10/2020 12:14:44 AM   Radiology CT HEAD CODE STROKE WO CONTRAST  Result Date: 03/09/2020 CLINICAL DATA:  Code stroke.  Acute neurologic deficit.  Weakness. EXAM: CT HEAD WITHOUT CONTRAST TECHNIQUE: Contiguous axial images were obtained from the base of the skull through the vertex without intravenous contrast. COMPARISON:  None. FINDINGS: Brain: There is no mass, hemorrhage or extra-axial collection. There is generalized atrophy without lobar predilection. There is hypoattenuation of the periventricular white matter, most commonly indicating chronic ischemic microangiopathy. Vascular: No abnormal hyperdensity of the major intracranial arteries or dural venous sinuses. No intracranial atherosclerosis. Skull: The visualized skull base, calvarium and extracranial soft tissues are normal. Sinuses/Orbits: No fluid levels or advanced mucosal thickening of the visualized paranasal sinuses. No mastoid or middle ear effusion. The orbits are normal. ASPECTS [facility] Stroke Program Early CT Score) - Ganglionic level infarction (caudate, lentiform nuclei, internal capsule, insula, M1-M3  cortex): 7 - Supraganglionic infarction (M4-M6 cortex): 3 Total score (0-10 with 10 being normal): 10 IMPRESSION: 1. No acute intracranial abnormality. 2. ASPECTS is 10. These results were called by telephone at the time of interpretation on 03/09/2020 at 11:34 pm to provider ZACKOWSKI, who verbally acknowledged these results. Electronically Signed   By: Ulyses Jarred M.D.   On: 03/09/2020 23:36    Procedures .Critical Care Performed by:  Rolland Porter, MD Authorized by: Rolland Porter, MD   Critical care provider statement:    Critical care time (minutes):  39   Critical care was necessary to treat or prevent imminent or life-threatening deterioration of the following conditions:  CNS failure or compromise and toxidrome   Critical care was time spent personally by me on the following activities:  Examination of patient, obtaining history from patient or surrogate, ordering and review of laboratory studies, ordering and review of radiographic studies, pulse oximetry and re-evaluation of patient's condition   (including critical care time)  Medications Ordered in ED Medications  LORazepam (ATIVAN) injection 0-4 mg (2 mg Intravenous Given 03/10/20 0412)    Or  LORazepam (ATIVAN) tablet 0-4 mg ( Oral See Alternative 03/10/20 0412)  LORazepam (ATIVAN) injection 0-4 mg (has no administration in time range)    Or  LORazepam (ATIVAN) tablet 0-4 mg (has no administration in time range)  thiamine tablet 100 mg (has no administration in time range)    Or  thiamine (B-1) injection 100 mg (has no administration in time range)  potassium chloride 10 mEq in 100 mL IVPB (0 mEq Intravenous Stopped 03/10/20 0542)  levETIRAcetam (KEPPRA) IVPB 1000 mg/100 mL premix (0 mg Intravenous Stopped 03/10/20 0112)  sodium chloride 0.9 % bolus 1,000 mL (0 mLs Intravenous Stopped 03/10/20 0542)    ED Course  I have reviewed the triage vital signs and the nursing notes.  Pertinent labs & imaging results that were available during my care of the patient were reviewed by me and considered in my medical decision making (see chart for details).    MDM Rules/Calculators/A&P                         Code stroke was called, concern was for acute intracranial hemorrhage, stroke, alcohol intoxication etc.  On reviewing his chart he does have a history of prior stroke.  Patient was reported to have had a seizure so he will probably not be a TPA candidate.  11:25 pt moving  all extremities when returned from CT  11:48 PM Teleneurology nurse called about reason for consult  12:34 AM Dr. Vickki Muff, teleneurologist has examined patient.  She thinks there is some mild left-sided weakness and we are not sure if that is from his prior stroke or new.  With his documented witnessed seizure she recommends giving him Keppra 1000 mg IV and then 500 mg twice a day.  She thinks he should stay in the ED overnight to get an MRI of his brain and an outpatient EEG.   3:30 AM over the past hour patient seems to be getting more anxious and fidgety.  He was started on CIWA protocol.  Patient became hypotensive around 2:45 AM Form.  When I Review His Vital Signs at 4:20 I Ordered a Liter of Normal Saline.  Patient Had CIWA Done Which Was around 23 and He Was Given Ativan.  He Is Now Calm and Not As Agitated and Fidgety.  Patient turned over to Dr Wilson Singer at change  of shift to get MR results and disposition.   Final Clinical Impression(s) / ED Diagnoses Final diagnoses:  Seizure ([place])  Alcoholic intoxication without complication [facility])    Rx / DC Orders ED Discharge Orders         Ordered    EEG        03/10/20 0429    levETIRAcetam (KEPPRA) 500 MG tablet  2 times daily        03/10/20 0429          Disposition pending  Rolland Porter, MD, Barbette Or, MD 03/10/20 [place], [place], MD 04/28/20 [phone]

## 2020-03-09 NOTE — ED Triage Notes (Signed)
Pt found by family having seizure like activity in his vehicle. Pt did have some stroke like symptoms for EMS and code stroke was activated.

## 2020-03-10 ENCOUNTER — Emergency Department (HOSPITAL_COMMUNITY): Payer: Medicare Other

## 2020-03-10 DIAGNOSIS — R29818 Other symptoms and signs involving the nervous system: Secondary | ICD-10-CM | POA: Diagnosis not present

## 2020-03-10 DIAGNOSIS — R569 Unspecified convulsions: Secondary | ICD-10-CM | POA: Diagnosis not present

## 2020-03-10 DIAGNOSIS — I6782 Cerebral ischemia: Secondary | ICD-10-CM | POA: Diagnosis not present

## 2020-03-10 DIAGNOSIS — I6389 Other cerebral infarction: Secondary | ICD-10-CM | POA: Diagnosis not present

## 2020-03-10 LAB — URINALYSIS, ROUTINE W REFLEX MICROSCOPIC
Bilirubin Urine: NEGATIVE
Glucose, UA: NEGATIVE mg/dL
Hgb urine dipstick: NEGATIVE
Ketones, ur: NEGATIVE mg/dL
Leukocytes,Ua: NEGATIVE
Nitrite: NEGATIVE
Protein, ur: NEGATIVE mg/dL
Specific Gravity, Urine: 1.002 — ABNORMAL LOW (ref 1.005–1.030)
pH: 6 (ref 5.0–8.0)

## 2020-03-10 LAB — ETHANOL: Alcohol, Ethyl (B): 380 mg/dL (ref <10)

## 2020-03-10 LAB — COMPREHENSIVE METABOLIC PANEL
ALT: 61 U/L — ABNORMAL HIGH (ref 0–44)
AST: 130 U/L — ABNORMAL HIGH (ref 15–41)
Albumin: 3.4 g/dL — ABNORMAL LOW (ref 3.5–5.0)
Alkaline Phosphatase: 129 U/L — ABNORMAL HIGH (ref 38–126)
Anion gap: 16 — ABNORMAL HIGH (ref 5–15)
BUN: 8 mg/dL (ref 8–23)
CO2: 23 mmol/L (ref 22–32)
Calcium: 8.8 mg/dL — ABNORMAL LOW (ref 8.9–10.3)
Chloride: 94 mmol/L — ABNORMAL LOW (ref 98–111)
Creatinine, Ser: 0.72 mg/dL (ref 0.61–1.24)
GFR calc Af Amer: >60 mL/min (ref >60)
GFR calc non Af Amer: >60 mL/min (ref >60)
Glucose, Bld: 170 mg/dL — ABNORMAL HIGH (ref 70–99)
Potassium: 2.8 mmol/L — ABNORMAL LOW (ref 3.5–5.1)
Sodium: 133 mmol/L — ABNORMAL LOW (ref 135–145)
Total Bilirubin: 2 mg/dL — ABNORMAL HIGH (ref 0.3–1.2)
Total Protein: 7.9 g/dL (ref 6.5–8.1)

## 2020-03-10 LAB — RAPID URINE DRUG SCREEN, HOSP PERFORMED
Amphetamines: NOT DETECTED
Barbiturates: NOT DETECTED
Benzodiazepines: NOT DETECTED
Cocaine: NOT DETECTED
Opiates: NOT DETECTED
Tetrahydrocannabinol: NOT DETECTED

## 2020-03-10 LAB — MAGNESIUM: Magnesium: 2.1 mg/dL (ref 1.7–2.4)

## 2020-03-10 MED ORDER — LORAZEPAM 1 MG PO TABS: 0.0000 mg | ORAL_TABLET | Freq: Four times a day (QID) | ORAL | Status: DC

## 2020-03-10 MED ORDER — SODIUM CHLORIDE 0.9 % IV BOLUS
1000.0000 mL | Freq: Once | INTRAVENOUS | Status: AC
  Administered 2020-03-10: 1000 mL via INTRAVENOUS

## 2020-03-10 MED ORDER — LORAZEPAM 1 MG PO TABS: 0.0000 mg | ORAL_TABLET | Freq: Two times a day (BID) | ORAL | Status: DC

## 2020-03-10 MED ORDER — LEVETIRACETAM IN NACL 1000 MG/100ML IV SOLN
1000.0000 mg | Freq: Once | INTRAVENOUS | Status: AC
  Administered 2020-03-10: 1000 mg via INTRAVENOUS
  Filled 2020-03-10: qty 100

## 2020-03-10 MED ORDER — LORAZEPAM 2 MG/ML IJ SOLN: 0.0000 mg | Freq: Two times a day (BID) | INTRAMUSCULAR | Status: DC

## 2020-03-10 MED ORDER — THIAMINE HCL 100 MG PO TABS: 100.0000 mg | ORAL_TABLET | Freq: Every day | ORAL | Status: DC

## 2020-03-10 MED ORDER — LORAZEPAM 2 MG/ML IJ SOLN
0.0000 mg | Freq: Four times a day (QID) | INTRAMUSCULAR | Status: DC
  Administered 2020-03-10: 2 mg via INTRAVENOUS
  Filled 2020-03-10: qty 1

## 2020-03-10 MED ORDER — LEVETIRACETAM 500 MG PO TABS: 500.0000 mg | ORAL_TABLET | Freq: Two times a day (BID) | ORAL | 1 refills | Status: DC

## 2020-03-10 MED ORDER — POTASSIUM CHLORIDE 10 MEQ/100ML IV SOLN
10.0000 meq | INTRAVENOUS | Status: AC
  Administered 2020-03-10 (×5): 10 meq via INTRAVENOUS
  Filled 2020-03-10 (×4): qty 100

## 2020-03-10 MED ORDER — THIAMINE HCL 100 MG/ML IJ SOLN: 100.0000 mg | Freq: Every day | INTRAMUSCULAR | Status: DC

## 2020-03-10 NOTE — Discharge Instructions (Signed)
No driving or any activity that you could get hurt if you had a seizure such as climbing ladders.  Take the seizure medication twice a day.  You should get called about getting an outpatient EEG scheduled which is a brainwave test to check for a area in your brain that would make you have seizures.  Call Dr. Freddie Apley office, he is a neurologist, and will follow up on your seizure and your EEG.  You need to get help with your alcohol problem, look at the resource guide.

## 2020-03-10 NOTE — Progress Notes (Signed)
CODE STROKE  CALL TIME  2300   EMS IN ROUTE BEEPER  2303 EXAM STARTED 2320 EXAM FINISHED 2324 SOC   2324 COMP. IN Epic 2324 RAD CALLED  2325

## 2020-03-10 NOTE — ED Provider Notes (Signed)
  Physical Exam  BP 110/76 (BP Location: Right Arm)   Pulse 71   Temp 97.8 F (36.6 C) (Oral)   Resp 19   Ht 5\' 10"  (1.778 m)   Wt 77.3 kg   SpO2 97%   BMI 24.45 kg/m     ED Course/Procedures     Patient remained in the emergency room overnight.  His subsequent MRI not showing any acute abnormality.  No further seizure-like activity.  He was loaded with Keppra. Potassium repleted.  On my exam he is tired but awakens and answers questions. He has been getting ativan per CIWA protocol. He states that he does have someone that can give him a ride home.  We will continue him on Keppra.  We will have him follow-up with neurology on outpatient basis.        Virgel Manifold, MD 03/10/20 [phone]

## 2020-03-10 NOTE — Consult Note (Signed)
[facility] Tele[facility]rology Consult Services  Stat Consult  Date of Service:   03/09/2020 23:36:45  Impression:     .  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 He is now lethargic, postictal appearing. He is arousable and intermittently follows commands. Recommend keppra 1000 mg x 1 now then 500 mg b.i.d. Monitor overnight, MRI brain w/o in the morning. EEG (routine) recommended, and can be done as outpt if patient is back to baseline in the morning and MRI brain neg for acute process.  CT HEAD: Showed No Acute Hemorrhage or Acute Core Infarct Reviewed  Metrics: [facility] Notification Time: 03/09/2020 23:34:28 Stamp Time: 03/09/2020 23:36:45 Callback Response Time: 03/09/2020 23:45:07  Our recommendations are outlined below.  Recommendations:     .  Load With Keppra 1000 mg Now     .  Start Keppra 500 mg BID     .  Recommend keppra 1000 mg x 1 now then 500 mg b.i.d.     .  Monitor overnight, MRI brain w/o in the morning. EEG (routine) recommended, and can be done as outpt if patient is back to baseline in the morning and MRI brain neg for acute process.  Imaging Studies:     .  MRI Head Without Contrast  Therapies:     .  Physical Therapy, Occupational Therapy, Speech Therapy Assessment When Applicable  Other WorkUp:     .  Check B12 level     .  Check TSH  Disposition: Neurology Follow Up Recommended  Sign Out:     .  Discussed with Emergency Department Provider  ----------------------------------------------------------------------------------------------------  Chief Complaint: seizure  History of Present Illness: Patient is a 69 year old Male.  69 yo male with a h/o prior stroke. Found in car having a seizure, unresponsive around 2200. Wasn't following commands at first. Now opening eyes,  slurred speech, slow to respond. In ct noted to be moving all extremities, still a little slow to respond. ETOH 380. He has mild left sided weakness.   Past Medical History:     . Hypertension     . Stroke   Antiplatelet use: asa 81    Examination: BP(89/61), Pulse(100), Blood Glucose(196) 1A: Level of Consciousness - Arouses to minor stimulation + 1 1B: Ask Month and Age - 1 Question Right + 1 1C: Blink Eyes & Squeeze Hands - Performs 1 Task + 1 2: Test Horizontal Extraocular Movements - Normal + 0 3: Test Visual Fields - No Visual Loss + 0 4: Test Facial Palsy (Use Grimace if Obtunded) - Minor paralysis (flat nasolabial fold, smile asymmetry) + 1 5A: Test Left Arm Motor Drift - Drift, but doesn't hit bed + 1 5B: Test Right Arm Motor Drift - No Drift for 10 Seconds + 0 6A: Test Left Leg Motor Drift - Drift, but doesn't hit bed + 1 6B: Test Right Leg Motor Drift - No Drift for 5 Seconds + 0 7: Test Limb Ataxia (FNF/Heel-Shin) - No Ataxia + 0 8: Test Sensation - Normal; No sensory loss + 0 9: Test Language/Aphasia - Normal; No aphasia + 0 10: Test Dysarthria - Mild-Moderate Dysarthria: Slurring but can be understood + 1 11: Test Extinction/Inattention - No abnormality + 0  NIHSS Score: 7    Patient is being evaluated for possible acute neurologic impairment and high probability of imminent or life-threatening deterioration. I spent total of 16 minutes  providing care to this patient, including time for face to face visit via telemedicine, review of medical records, imaging studies and discussion of findings with providers, the patient and/or family.   Dr Jeralene Huff   [facility] [phone]  Case [id number]

## 2020-04-17 ENCOUNTER — Ambulatory Visit (INDEPENDENT_AMBULATORY_CARE_PROVIDER_SITE_OTHER): Payer: Medicare Other | Admitting: Family Medicine

## 2020-04-17 ENCOUNTER — Encounter: Payer: Self-pay | Admitting: Family Medicine

## 2020-04-17 ENCOUNTER — Telehealth: Payer: Self-pay | Admitting: *Deleted

## 2020-04-17 VITALS — BP 90/62 | HR 96 | Temp 97.4°F | Ht 70.0 in | Wt 156.0 lb

## 2020-04-17 DIAGNOSIS — Z79899 Other long term (current) drug therapy: Secondary | ICD-10-CM

## 2020-04-17 DIAGNOSIS — F1010 Alcohol abuse, uncomplicated: Secondary | ICD-10-CM

## 2020-04-17 DIAGNOSIS — F4321 Adjustment disorder with depressed mood: Secondary | ICD-10-CM | POA: Diagnosis not present

## 2020-04-17 DIAGNOSIS — E785 Hyperlipidemia, unspecified: Secondary | ICD-10-CM

## 2020-04-17 DIAGNOSIS — I10 Essential (primary) hypertension: Secondary | ICD-10-CM

## 2020-04-17 NOTE — Telephone Encounter (Signed)
Has upcoming appt for physical. Do you want labs. Last routine labs 11/22/19 lipid, liver, psa and had some labs done in hospital on 03/09/20

## 2020-04-17 NOTE — Progress Notes (Signed)
Subjective:    Patient ID: Miguel Solis, male    DOB: 25-Apr-1951, 69 y.o.   MRN: [id number]    Patient ID: Miguel Solis, male    DOB: Sep 01, 1950, 69 y.o.   MRN: [id number]   Chief Complaint  Patient presents with  . not eating   Subjective:  CC: not eating  Miguel Solis presents today with his daughter, with concerns of not eating, sleeping a lot.  Upon further interview, it is noted that the patient's wife died in 2023/07/23.  The daughter moved out for a few months, she moved back and and discovered that her dad had stopped taking his medication, and is drinking a lot of alcohol most days.  Daughter reports there are days when she cannot get him to get out of bed until 6 PM and that he is not eating.  Pertinent negatives include no fever, no chest pain, no shortness of breath, no cough, no history of cancer, no thoughts or plans of self-harm.  He is on an SSRI-has only been back on that medication since approximately September 10.  When his daughter moved back in.  It is noted that on September 6, Miguel Solis had an emergency room visit for seizure activity.  His alcohol level at that time was 380.  He was treated in the emergency room, and started on Keppra at that time.  He does not report any seizure activity today. not eating and not sleeping well for the past 3 days.    Medical History Miguel Solis has a past medical history of Colon polyp (2008), Foley catheter in place, Gout, Hyperlipemia, Hyperlipidemia, Hypertension, Impaired fasting glucose, and Urinary retention.   Outpatient Encounter Medications as of 04/17/2020  Medication Sig  . aspirin 325 MG tablet Take 1 tablet (325 mg total) by mouth daily.  Marland Kitchen atorvastatin (LIPITOR) 40 MG tablet Take 1 tablet (40 mg total) by mouth daily.  Marland Kitchen escitalopram (LEXAPRO) 10 MG tablet Take 1 tablet (10 mg total) by mouth every morning.  . levETIRAcetam (KEPPRA) 500 MG tablet Take 1 tablet (500 mg total) by mouth 2 (two) times daily.  Marland Kitchen lisinopril  (ZESTRIL) 10 MG tablet Take 10 mg by mouth daily.  . valsartan (DIOVAN) 160 MG tablet Take 1 tablet (160 mg total) by mouth daily. (Patient not taking: Reported on 04/17/2020)  . [DISCONTINUED] ketoconazole (NIZORAL) 2 % cream Apply 1 application topically 2 (two) times daily. Apply BID to rash   No facility-administered encounter medications on file as of 04/17/2020.     Review of Systems  Constitutional: Negative for chills and fever.  Cardiovascular: Negative for chest pain.  Gastrointestinal: Negative for abdominal pain.  Genitourinary: Negative.   Psychiatric/Behavioral:       Mourning loss of spouse     Vitals BP 90/62   Pulse 96   Temp (!) 97.4 F (36.3 C)   Ht 5\' 10"  (1.778 m)   Wt 156 lb (70.8 kg)   SpO2 100%   BMI 22.38 kg/m   Objective:   Physical Exam Vitals and nursing note reviewed.  Constitutional:      General: He is not in acute distress. Cardiovascular:     Rate and Rhythm: Normal rate and regular rhythm.     Heart sounds: Normal heart sounds.  Pulmonary:     Effort: Pulmonary effort is normal.     Breath sounds: Normal breath sounds.  Skin:    General: Skin is warm and dry.  Neurological:  General: No focal deficit present.     Mental Status: He is alert.  Psychiatric:        Behavior: Behavior normal.      Assessment and Plan   1. Alcohol abuse  2. Unresolved grief   Miguel Solis sitting on the exam table, not extremely talkative.  Denies thoughts of self-harm, endorses sadness after wife's death.  Daughter is managing medications, and only allows him  Four 16 ounce beers per day.  This is an improvement from previous alcohol consumption. PHQ-9: 7 in the office today, however, I wonder if some questions were not answered appropriately.  He reports that he just does not have an appetite.  Information given at discharge, on grief counseling through hospice.  This service is available by self-referral.  The nurse gave the information to his  daughter.  I asked Miguel Solis if he would consider not drinking beer every day until he is first had food.  He states that he can do this.  He will stick with his 4 beers per day for now.  Go to grief counseling.  And continue taking his medications every day.  It is possible that he will need a more intensive alcohol rehab therapy.  Agrees with plan of care discussed today. Understands warning signs to seek further care: Significant changes in his health status, thoughts of self-harm, bleeding. Understands to follow-up with Miguel Solis in 1 month for an annual physical and possible repeat laboratory work, return sooner if there are any changes in health status.   Chalmers Guest, NP 04/19/2020       Assessment & Plan:

## 2020-04-17 NOTE — Patient Instructions (Addendum)
Don't drink beer until you eat food first. Don't drink more than 4 beers for now. WE will try to get you set up with grief counseling. Continue taking your medicines every day.     Alcohol Abuse and Dependence Information, Adult Alcohol is a widely available drug. People drink alcohol in different amounts. People who drink alcohol very often and in large amounts often have problems during and after drinking. They may develop what is called an alcohol use disorder. There are two main types of alcohol use disorders:  Alcohol abuse. This is when you use alcohol too much or too often. You may use alcohol to make yourself feel happy or to reduce stress. You may have a hard time setting a limit on the amount you drink.  Alcohol dependence. This is when you use alcohol consistently for a period of time, and your body changes as a result. This can make it hard to stop drinking because you may start to feel sick or feel different when you do not use alcohol. These symptoms are known as withdrawal. How can alcohol abuse and dependence affect me? Alcohol abuse and dependence can have a negative effect on your life. Drinking too much can lead to addiction. You may feel like you need alcohol to function normally. You may drink alcohol before work in the morning, during the day, or as soon as you get home from work in the evening. These actions can result in:  Poor work performance.  Job loss.  Financial problems.  Car crashes or criminal charges from driving after drinking alcohol.  Problems in your relationships with friends and family.  Losing the trust and respect of coworkers, friends, and family. Drinking heavily over a long period of time can permanently damage your body and brain, and can cause lifelong health issues, such as:  Damage to your liver or pancreas.  Heart problems, high blood pressure, or stroke.  Certain cancers.  Decreased ability to fight infections.  Brain or nerve  damage.  Depression.  Early (premature) death. If you are careless or you crave alcohol, it is easy to drink more than your body can handle (overdose). Alcohol overdose is a serious situation that requires hospitalization. It may lead to permanent injuries or death. What can increase my risk?  Having a family history of alcohol abuse.  Having depression or other mental health conditions.  Beginning to drink at an early age.  Binge drinking often.  Experiencing trauma, stress, and an unstable home life during childhood.  Spending time with people who drink often. What actions can I take to prevent or manage alcohol abuse and dependence?  Do not drink alcohol if: ? Your health care provider tells you not to drink. ? You are pregnant, may be pregnant, or are planning to become pregnant.  If you drink alcohol: ? Limit how much you use to:  0-1 drink a day for women.  0-2 drinks a day for men. ? Be aware of how much alcohol is in your drink. In the [place] one drink equals one 12 oz bottle of beer (355 mL), one 5 oz glass of wine (148 mL), or one 1 oz glass of hard liquor (44 mL).  Stop drinking if you have been drinking too much. This can be very hard to do if you are used to abusing alcohol. If you begin to have withdrawal symptoms, talk with your health care provider or a person that you trust. These symptoms may include anxiety, shaky hands,  headache, nausea, sweating, or not being able to sleep.  Choose to drink nonalcoholic beverages in social gatherings and places where there may be alcohol. Activity  Spend more time on activities that you enjoy that do not involve alcohol, like hobbies or exercise.  Find healthy ways to cope with stress, such as exercise, meditation, or spending time with people you care about. General information  Talk to your family, coworkers, and friends about supporting you in your efforts to stop drinking. If they drink, ask them not to drink around  you. Spend more time with people who do not drink alcohol.  If you think that you have an alcohol dependency problem: ? Tell friends or family about your concerns. ? Talk with your health care provider or another health professional about where to get help. ? Work with a Transport planner and a Regulatory affairs officer. ? Consider joining a support group for people who struggle with alcohol abuse and dependence. Where to find support   Your health care provider.  SMART Recovery: www.smartrecovery.org Therapy and support groups  Local treatment centers or chemical dependency counselors.  Local AA groups in your community: NicTax.com.pt Where to find more information  Centers for Disease Control and Prevention: http://www.wolf.info/  National Institute on Alcohol Abuse and Alcoholism: http://www.bradshaw.com/  Alcoholics Anonymous (AA): NicTax.com.pt Contact a health care provider if:  You drank more or for longer than you intended on more than one occasion.  You tried to stop drinking or to cut back on how much you drink, but you were not able to.  You often drink to the point of vomiting or passing out.  You want to drink so badly that you cannot think about anything else.  You have problems in your life due to drinking, but you continue to drink.  You keep drinking even though you feel anxious, depressed, or have experienced memory loss.  You have stopped doing the things you used to enjoy in order to drink.  You have to drink more than you used to in order to get the effect you want.  You experience anxiety, sweating, nausea, shakiness, and trouble sleeping when you try to stop drinking. Get help right away if:  You have thoughts about hurting yourself or others.  You have serious withdrawal symptoms, including: ? Confusion. ? Racing heart. ? High blood pressure. ? Fever. If you ever feel like you may hurt yourself or others, or have thoughts about taking your own life, get help right  away. You can go to your nearest emergency department or call:  Your local emergency services (911 in the [place]  A suicide crisis helpline, such as the [facility] at 714-843-3714. This is open 24 hours a day. Summary  Alcohol abuse and dependence can have a negative effect on your life. Drinking too much or too often can lead to addiction.  If you drink alcohol, limit how much you use.  If you are having trouble keeping your drinking under control, find ways to change your behavior. Hobbies, calming activities, exercise, or support groups can help.  If you feel you need help with changing your drinking habits, talk with your health care provider, a good friend, or a therapist, or go to an [facility] group. This information is not intended to replace advice given to you by your health care provider. Make sure you discuss any questions you have with your health care provider. Document Revised: 10/09/2018 Document Reviewed: 08/28/2018 Elsevier Patient Education  [place].

## 2020-04-19 ENCOUNTER — Encounter: Payer: Self-pay | Admitting: Family Medicine

## 2020-04-19 DIAGNOSIS — F4321 Adjustment disorder with depressed mood: Secondary | ICD-10-CM | POA: Insufficient documentation

## 2020-04-20 NOTE — Telephone Encounter (Signed)
Bw orders pt in and tried to call no answer. Bw orders mailed to pt to do before visit in November.

## 2020-04-20 NOTE — Telephone Encounter (Signed)
Yes, need to recheck his labs, bc they were very abnormal at the ER visit.  Order-cbc,cmp, lipids. Thx. Dr. Lovena Le

## 2020-06-01 ENCOUNTER — Ambulatory Visit: Payer: Medicare Other | Admitting: Family Medicine

## 2020-06-05 DIAGNOSIS — Z79899 Other long term (current) drug therapy: Secondary | ICD-10-CM | POA: Diagnosis not present

## 2020-06-05 DIAGNOSIS — I10 Essential (primary) hypertension: Secondary | ICD-10-CM | POA: Diagnosis not present

## 2020-06-05 DIAGNOSIS — E785 Hyperlipidemia, unspecified: Secondary | ICD-10-CM | POA: Diagnosis not present

## 2020-06-06 LAB — CBC WITH DIFFERENTIAL/PLATELET
Basophils Absolute: 0.1 10*3/uL (ref 0.0–0.2)
Basos: 1 %
EOS (ABSOLUTE): 0.7 10*3/uL — ABNORMAL HIGH (ref 0.0–0.4)
Eos: 8 %
Hematocrit: 43.9 % (ref 37.5–51.0)
Hemoglobin: 14.7 g/dL (ref 13.0–17.7)
Immature Grans (Abs): 0 10*3/uL (ref 0.0–0.1)
Immature Granulocytes: 0 %
Lymphocytes Absolute: 2.8 10*3/uL (ref 0.7–3.1)
Lymphs: 33 %
MCH: 35.8 pg — ABNORMAL HIGH (ref 26.6–33.0)
MCHC: 33.5 g/dL (ref 31.5–35.7)
MCV: 107 fL — ABNORMAL HIGH (ref 79–97)
Monocytes Absolute: 0.7 10*3/uL (ref 0.1–0.9)
Monocytes: 8 %
Neutrophils Absolute: 4.3 10*3/uL (ref 1.4–7.0)
Neutrophils: 50 %
Platelets: 284 10*3/uL (ref 150–450)
RBC: 4.11 x10E6/uL — ABNORMAL LOW (ref 4.14–5.80)
RDW: 11.8 % (ref 11.6–15.4)
WBC: 8.6 10*3/uL (ref 3.4–10.8)

## 2020-06-06 LAB — COMPREHENSIVE METABOLIC PANEL
ALT: 18 IU/L (ref 0–44)
AST: 25 IU/L (ref 0–40)
Albumin/Globulin Ratio: 1.2 (ref 1.2–2.2)
Albumin: 3.8 g/dL (ref 3.8–4.8)
Alkaline Phosphatase: 107 IU/L (ref 44–121)
BUN/Creatinine Ratio: 14 (ref 10–24)
BUN: 11 mg/dL (ref 8–27)
Bilirubin Total: 0.7 mg/dL (ref 0.0–1.2)
CO2: 24 mmol/L (ref 20–29)
Calcium: 9.1 mg/dL (ref 8.6–10.2)
Chloride: 103 mmol/L (ref 96–106)
Creatinine, Ser: 0.81 mg/dL (ref 0.76–1.27)
GFR calc Af Amer: 104 mL/min/{1.73_m2} (ref >59)
GFR calc non Af Amer: 90 mL/min/{1.73_m2} (ref >59)
Globulin, Total: 3.1 g/dL (ref 1.5–4.5)
Glucose: 121 mg/dL — ABNORMAL HIGH (ref 65–99)
Potassium: 4.6 mmol/L (ref 3.5–5.2)
Sodium: 140 mmol/L (ref 134–144)
Total Protein: 6.9 g/dL (ref 6.0–8.5)

## 2020-06-06 LAB — LIPID PANEL
Chol/HDL Ratio: 3.3 ratio (ref 0.0–5.0)
Cholesterol, Total: 178 mg/dL (ref 100–199)
HDL: 54 mg/dL (ref >39)
LDL Chol Calc (NIH): 110 mg/dL — ABNORMAL HIGH (ref 0–99)
Triglycerides: 77 mg/dL (ref 0–149)
VLDL Cholesterol Cal: 14 mg/dL (ref 5–40)

## 2020-06-08 ENCOUNTER — Other Ambulatory Visit: Payer: Self-pay

## 2020-06-08 ENCOUNTER — Encounter: Payer: Self-pay | Admitting: Family Medicine

## 2020-06-08 ENCOUNTER — Ambulatory Visit (INDEPENDENT_AMBULATORY_CARE_PROVIDER_SITE_OTHER): Payer: Medicare Other | Admitting: Family Medicine

## 2020-06-08 VITALS — BP 98/62 | HR 75 | Temp 97.1°F | Ht 70.0 in | Wt 167.0 lb

## 2020-06-08 DIAGNOSIS — Z8673 Personal history of transient ischemic attack (TIA), and cerebral infarction without residual deficits: Secondary | ICD-10-CM

## 2020-06-08 DIAGNOSIS — Z87898 Personal history of other specified conditions: Secondary | ICD-10-CM

## 2020-06-08 DIAGNOSIS — I10 Essential (primary) hypertension: Secondary | ICD-10-CM

## 2020-06-08 DIAGNOSIS — E785 Hyperlipidemia, unspecified: Secondary | ICD-10-CM

## 2020-06-08 DIAGNOSIS — F32A Depression, unspecified: Secondary | ICD-10-CM

## 2020-06-08 DIAGNOSIS — F1010 Alcohol abuse, uncomplicated: Secondary | ICD-10-CM

## 2020-06-08 DIAGNOSIS — R630 Anorexia: Secondary | ICD-10-CM

## 2020-06-08 MED ORDER — LISINOPRIL 10 MG PO TABS: 10.0000 mg | ORAL_TABLET | Freq: Every day | ORAL | 1 refills | Status: DC

## 2020-06-08 MED ORDER — ESCITALOPRAM OXALATE 10 MG PO TABS: 10.0000 mg | ORAL_TABLET | ORAL | 1 refills | Status: DC

## 2020-06-08 MED ORDER — ATORVASTATIN CALCIUM 40 MG PO TABS: 40.0000 mg | ORAL_TABLET | Freq: Every day | ORAL | 1 refills | Status: DC

## 2020-06-08 NOTE — Progress Notes (Signed)
Patient ID: Miguel Solis, male    DOB: 07/25/1950, 69 y.o.   MRN: [id number]   Chief Complaint  Patient presents with   Hypertension    follow up   Subjective:    HPI   F/u htn- Pt seen with daughter today.  Gained 11 lbs. Recently. Pt seen in 10/21 for dec appetite.  Since then appetite returned. Getting out and went hunting. Living with daughter and in law lives with him.  Glucose 121.   Liver enzymes improved.   Cutting back on alcohol intake. Per day having 3 drinks per day. Was at 3-5 drinks per day.  Daughter helping with reducing his alcohol.  She is making sure he eats first before having alcohol.  She is dosing out the alcohol and making sure only 3 drinks per day.  Had prostate surgery, and not having issues with incontinence. About 2 yrs ago.  Pt was given keppra when in hospital.  Gave it to him in case it was seizures related to drinking. Pt hasn't had it since hospitalization in 9/21. Pt stopped on his own taking the keppra.  Medical History Marlee has a past medical history of Colon polyp (2008), Foley catheter in place, Gout, Hyperlipemia, Hyperlipidemia, Hypertension, Impaired fasting glucose, and Urinary retention.   Outpatient Encounter Medications as of 06/08/2020  Medication Sig   aspirin 325 MG tablet Take 1 tablet (325 mg total) by mouth daily.   atorvastatin (LIPITOR) 40 MG tablet Take 1 tablet (40 mg total) by mouth daily.   escitalopram (LEXAPRO) 10 MG tablet Take 1 tablet (10 mg total) by mouth every morning.   lisinopril (ZESTRIL) 10 MG tablet Take 1 tablet (10 mg total) by mouth daily.   valsartan (DIOVAN) 160 MG tablet Take 1 tablet (160 mg total) by mouth daily. (Patient not taking: Reported on 04/17/2020)   [DISCONTINUED] atorvastatin (LIPITOR) 40 MG tablet Take 1 tablet (40 mg total) by mouth daily.   [DISCONTINUED] escitalopram (LEXAPRO) 10 MG tablet Take 1 tablet (10 mg total) by mouth every morning.   [DISCONTINUED]  levETIRAcetam (KEPPRA) 500 MG tablet Take 1 tablet (500 mg total) by mouth 2 (two) times daily. (Patient not taking: Reported on 06/08/2020)   [DISCONTINUED] lisinopril (ZESTRIL) 10 MG tablet Take 10 mg by mouth daily.   No facility-administered encounter medications on file as of 06/08/2020.     Review of Systems  Constitutional: Negative for chills and fever.  HENT: Negative for congestion, rhinorrhea and sore throat.   Respiratory: Negative for cough, shortness of breath and wheezing.   Cardiovascular: Negative for chest pain and leg swelling.  Gastrointestinal: Negative for abdominal pain, diarrhea, nausea and vomiting.  Genitourinary: Negative for dysuria and frequency.  Skin: Negative for rash.  Neurological: Negative for dizziness, seizures, weakness and headaches.     Vitals BP 98/62    Pulse 75    Temp (!) 97.1 F (36.2 C) (Oral)    Ht 5\' 10"  (1.778 m)    Wt 167 lb (75.8 kg)    SpO2 99%    BMI 23.96 kg/m   Objective:   Physical Exam Vitals and nursing note reviewed.  Constitutional:      General: He is not in acute distress.    Appearance: Normal appearance. He is not ill-appearing.  HENT:     Head: Normocephalic.     Nose: Nose normal. No congestion.     Mouth/Throat:     Mouth: Mucous membranes are moist.     Pharynx:  No oropharyngeal exudate.  Eyes:     Extraocular Movements: Extraocular movements intact.     Conjunctiva/sclera: Conjunctivae normal.     Pupils: Pupils are equal, round, and reactive to light.  Cardiovascular:     Rate and Rhythm: Normal rate and regular rhythm.     Pulses: Normal pulses.     Heart sounds: Normal heart sounds. No murmur heard.   Pulmonary:     Effort: Pulmonary effort is normal.     Breath sounds: Normal breath sounds. No wheezing, rhonchi or rales.  Musculoskeletal:        General: Normal range of motion.     Right lower leg: No edema.     Left lower leg: No edema.  Skin:    General: Skin is warm and dry.      Findings: No rash.  Neurological:     General: No focal deficit present.     Mental Status: He is alert and oriented to person, place, and time.     Cranial Nerves: No cranial nerve deficit.  Psychiatric:        Mood and Affect: Mood normal.        Behavior: Behavior normal.        Thought Content: Thought content normal.        Judgment: Judgment normal.      Assessment and Plan   1. Essential hypertension - lisinopril (ZESTRIL) 10 MG tablet; Take 1 tablet (10 mg total) by mouth daily.  Dispense: 90 tablet; Refill: 1  2. History of CVA (cerebrovascular accident)  3. Alcohol abuse  4. History of seizure  5. Loss of appetite  6. Hyperlipidemia, unspecified hyperlipidemia type - atorvastatin (LIPITOR) 40 MG tablet; Take 1 tablet (40 mg total) by mouth daily.  Dispense: 90 tablet; Refill: 1  7. Depression, unspecified depression type - escitalopram (LEXAPRO) 10 MG tablet; Take 1 tablet (10 mg total) by mouth every morning.  Dispense: 90 tablet; Refill: 1   Pt is improving with weight gain and appetite.  Pt is still continuing to drink alcohol, supervised by daughter.  Pt stating he stopped the keppra for seizures.   No further seizure activity. Pt advised to slowly dec alcohol to avoid having DTs or seizures.  Depression/grief- doing well with lexapro.  H/o CVA in 2020- stable. Pt to cont Lipitor and lisinopril.  Pt has stopped taking valsartan. Will cont to monitor bp.  It is on low side today. 98/62.  HLD- cont lipitor.  F/u 90mo or prn.

## 2020-06-21 DIAGNOSIS — Z87898 Personal history of other specified conditions: Secondary | ICD-10-CM | POA: Insufficient documentation

## 2020-08-04 ENCOUNTER — Emergency Department (HOSPITAL_COMMUNITY): Payer: Medicare Other

## 2020-08-04 ENCOUNTER — Encounter (HOSPITAL_COMMUNITY): Payer: Self-pay

## 2020-08-04 ENCOUNTER — Other Ambulatory Visit: Payer: Self-pay

## 2020-08-04 ENCOUNTER — Emergency Department (HOSPITAL_COMMUNITY)
Admission: EM | Admit: 2020-08-04 | Discharge: 2020-08-05 | Disposition: A | Discharge status: Home / Self Care | Payer: Medicare Other | Attending: Emergency Medicine | Admitting: Emergency Medicine

## 2020-08-04 DIAGNOSIS — Y908 Blood alcohol level of 240 mg/100 ml or more: Secondary | ICD-10-CM | POA: Insufficient documentation

## 2020-08-04 DIAGNOSIS — I639 Cerebral infarction, unspecified: Secondary | ICD-10-CM | POA: Diagnosis present

## 2020-08-04 DIAGNOSIS — R404 Transient alteration of awareness: Secondary | ICD-10-CM | POA: Diagnosis not present

## 2020-08-04 DIAGNOSIS — I10 Essential (primary) hypertension: Secondary | ICD-10-CM | POA: Insufficient documentation

## 2020-08-04 DIAGNOSIS — Z79899 Other long term (current) drug therapy: Secondary | ICD-10-CM | POA: Insufficient documentation

## 2020-08-04 DIAGNOSIS — R531 Weakness: Secondary | ICD-10-CM | POA: Insufficient documentation

## 2020-08-04 DIAGNOSIS — R253 Fasciculation: Secondary | ICD-10-CM | POA: Diagnosis not present

## 2020-08-04 DIAGNOSIS — R29810 Facial weakness: Secondary | ICD-10-CM | POA: Diagnosis not present

## 2020-08-04 DIAGNOSIS — R0902 Hypoxemia: Secondary | ICD-10-CM | POA: Diagnosis not present

## 2020-08-04 DIAGNOSIS — I739 Peripheral vascular disease, unspecified: Secondary | ICD-10-CM | POA: Diagnosis not present

## 2020-08-04 DIAGNOSIS — Z7982 Long term (current) use of aspirin: Secondary | ICD-10-CM | POA: Insufficient documentation

## 2020-08-04 DIAGNOSIS — I672 Cerebral atherosclerosis: Secondary | ICD-10-CM | POA: Diagnosis not present

## 2020-08-04 DIAGNOSIS — Z20822 Contact with and (suspected) exposure to covid-19: Secondary | ICD-10-CM | POA: Insufficient documentation

## 2020-08-04 DIAGNOSIS — F10920 Alcohol use, unspecified with intoxication, uncomplicated: Secondary | ICD-10-CM | POA: Insufficient documentation

## 2020-08-04 DIAGNOSIS — R0689 Other abnormalities of breathing: Secondary | ICD-10-CM | POA: Diagnosis not present

## 2020-08-04 DIAGNOSIS — J3489 Other specified disorders of nose and nasal sinuses: Secondary | ICD-10-CM | POA: Diagnosis not present

## 2020-08-04 DIAGNOSIS — F10239 Alcohol dependence with withdrawal, unspecified: Secondary | ICD-10-CM | POA: Diagnosis not present

## 2020-08-04 LAB — CBC
HCT: 47.7 % (ref 39.0–52.0)
Hemoglobin: 16 g/dL (ref 13.0–17.0)
MCH: 35.2 pg — ABNORMAL HIGH (ref 26.0–34.0)
MCHC: 33.5 g/dL (ref 30.0–36.0)
MCV: 105.1 fL — ABNORMAL HIGH (ref 80.0–100.0)
Platelets: 238 10*3/uL (ref 150–400)
RBC: 4.54 MIL/uL (ref 4.22–5.81)
RDW: 12 % (ref 11.5–15.5)
WBC: 11 10*3/uL — ABNORMAL HIGH (ref 4.0–10.5)
nRBC: 0 % (ref 0.0–0.2)

## 2020-08-04 LAB — COMPREHENSIVE METABOLIC PANEL
ALT: 22 U/L (ref 0–44)
AST: 31 U/L (ref 15–41)
Albumin: 4.1 g/dL (ref 3.5–5.0)
Alkaline Phosphatase: 80 U/L (ref 38–126)
Anion gap: 10 (ref 5–15)
BUN: 13 mg/dL (ref 8–23)
CO2: 23 mmol/L (ref 22–32)
Calcium: 9 mg/dL (ref 8.9–10.3)
Chloride: 100 mmol/L (ref 98–111)
Creatinine, Ser: 0.92 mg/dL (ref 0.61–1.24)
GFR, Estimated: >60 mL/min (ref >60)
Glucose, Bld: 200 mg/dL — ABNORMAL HIGH (ref 70–99)
Potassium: 3.3 mmol/L — ABNORMAL LOW (ref 3.5–5.1)
Sodium: 133 mmol/L — ABNORMAL LOW (ref 135–145)
Total Bilirubin: 0.8 mg/dL (ref 0.3–1.2)
Total Protein: 8.3 g/dL — ABNORMAL HIGH (ref 6.5–8.1)

## 2020-08-04 LAB — DIFFERENTIAL
Basophils Absolute: 0.1 10*3/uL (ref 0.0–0.1)
Basophils Relative: 1 %
Eosinophils Absolute: 0.8 10*3/uL — ABNORMAL HIGH (ref 0.0–0.5)
Eosinophils Relative: 7 %
Lymphocytes Relative: 44 %
Lymphs Abs: 4.9 10*3/uL — ABNORMAL HIGH (ref 0.7–4.0)
Monocytes Absolute: 0.7 10*3/uL (ref 0.1–1.0)
Monocytes Relative: 6 %
Neutro Abs: 4.7 10*3/uL (ref 1.7–7.7)
Neutrophils Relative %: 42 %

## 2020-08-04 LAB — PROTIME-INR
INR: 1 (ref 0.8–1.2)
Prothrombin Time: 13.1 seconds (ref 11.4–15.2)

## 2020-08-04 LAB — CBG MONITORING, ED: Glucose-Capillary: 167 mg/dL — ABNORMAL HIGH (ref 70–99)

## 2020-08-04 LAB — APTT: aPTT: 36 seconds (ref 24–36)

## 2020-08-04 LAB — SARS CORONAVIRUS 2 BY RT PCR (HOSPITAL ORDER, PERFORMED IN ~~LOC~~ HOSPITAL LAB): SARS Coronavirus 2: NEGATIVE

## 2020-08-04 LAB — ETHANOL: Alcohol, Ethyl (B): 313 mg/dL (ref <10)

## 2020-08-04 MED ORDER — SODIUM CHLORIDE 0.9 % IV SOLN: INTRAVENOUS | Status: DC

## 2020-08-04 NOTE — Consult Note (Signed)
[facility] Consult Services   Date of Service:   08/04/2020 20:00:04  Diagnosis:     .  G92 - Toxic encephalopathy  Impression: Although left hemisphere stroke is possible I think that this patient is encephalopathic. Possibilities include alcohol intoxication noting findings historically. Also include that he had an unwitnessed seizure and is postictal. It is not clear that he is on seizure medication in this needs to be resolved. I did not feel comfortable treating with alteplase because I didn't think the benefits outweigh the risk. There is nothing to suggest large vessel occlusion.  Metrics: Last Known Well: 08/04/2020 19:30:00 [facility] Notification Time: 08/04/2020 20:00:04 Arrival Time: 08/04/2020 20:20:51 Stamp Time: 08/04/2020 20:00:04 Initial Response Time: 08/04/2020 20:02:38 Symptoms: Unable to speak. NIHSS Start Assessment Time: 08/04/2020 20:11:00 Patient is not a candidate for Thrombolytic. Thrombolytic Medical Decision: 08/04/2020 20:13:00 Patient was not deemed candidate for Thrombolytic because of following reasons: Other Diagnosis suspected.  CT head showed no acute hemorrhage or acute core infarct. CT head was reviewed and results were: Chronic white matter ischemic changes and the corner infarct that are old but no evidence of acute ischemia, hemorrhage, or mass lesion.  Radiologist was not called back for review of advanced imaging. ED Physician notified of diagnostic impression and management plan on 08/04/2020 20:18:00  Advanced Imaging: Advanced Imaging Not Recommended because:  Clinical Presentation is not Suggestive of LVO and NIHSS is <6   Our recommendations are outlined below.  Recommendations:      .  Stroke/Telemetry Floor     .  Neuro Checks     .  Bedside Swallow Eval     .  DVT Prophylaxis     .  IV Fluids, Normal Saline     .  Head of Bed 30 Degrees     .  Euglycemia and Avoid Hyperthermia (PRN  Acetaminophen)     .  Initiate Aspirin 81 MG Daily     .  Blood alcohol level, liver function, PT/INR, watch for signs of withdrawal  Routine Consultation with [facility] for Follow up Care  Sign Out:     .  Discussed with Emergency Department Provider    ------------------------------------------------------------------------------  History of Present Illness: Patient is a 70 year old Male.  Patient was brought by EMS for symptoms of Unable to speak.  This 70 year old man apparently at 63 developed a right facial droop. EMS arrived and front of them he stopped talking. He was always moving all four extremities. By the time I got to see him at the hospital after he had scan done he was difficult to wake up. He would give simple answers to the ER physician prior to my seeing him but then he became a little bit more obtunded. He was always moving his extremities.   Past Medical History:     . Hypertension     . Diabetes Mellitus     . Stroke     . There is NO history of Hyperlipidemia     . There is NO history of Atrial Fibrillation     . There is NO history of Coronary Artery Disease     . There is NO history of Covid-19     . He has a history of seizures and prior stroke supposedly with right-sided weakness. Three hospital visits since February 2020 he had three blood alcohol levels over 360.  Social History:Unable to obtain due to Patient Status  Family History:Unable to obtain due to Patient Status  Anticoagulant use:  Unknown  Antiplatelet use: Unknown  Allergies:  NKDA  NIHSS may not be reliable due to: Patient is encephalopathic or intoxicated  Examination: BP(148/87), Pulse(Not available), Blood Glucose(240) 1A: Level of Consciousness - Requires repeated stimulation to arouse + 2 1B: Ask Month and Age - Could Not Answer Either Question Correctly + 2 1C: Blink Eyes & Squeeze Hands - Performs 0 Tasks + 2 2: Test Horizontal Extraocular Movements - Normal +  0 3: Test Visual Fields - No Visual Loss + 0 4: Test Facial Palsy (Use Grimace if Obtunded) - Normal symmetry + 0 5A: Test Left Arm Motor Drift - No Drift for 10 Seconds + 0 5B: Test Right Arm Motor Drift - No Drift for 10 Seconds + 0 6A: Test Left Leg Motor Drift - No Drift for 5 Seconds + 0 6B: Test Right Leg Motor Drift - No Drift for 5 Seconds + 0 7: Test Limb Ataxia (FNF/Heel-Shin) - No Ataxia + 0 8: Test Sensation - Normal; No sensory loss + 0 9: Test Language/Aphasia - Mute/Global Aphasia: No Usable Speech/Auditory Comprehension + 3 10: Test Dysarthria - Normal + 0 11: Test Extinction/Inattention - No abnormality + 0  NIHSS Score: 9  Pre-Morbid Modified Rankin Scale: Unable to assess   Patient/Family was informed the Neurology Consult would occur via TeleHealth consult by way of interactive audio and video telecommunications and consented to receiving care in this manner.   Patient is being evaluated for possible acute neurologic impairment and high probability of imminent or life-threatening deterioration. I spent total of 28 minutes providing care to this patient, including time for face to face visit via telemedicine, review of medical records, imaging studies and discussion of findings with providers, the patient and/or family.   Dr Cindie Laroche   [facility] [phone]  Case [id number]

## 2020-08-04 NOTE — ED Notes (Signed)
Pt found to have soiled bed linens. With assistance from staff, patient rolled and changed into a clean hospital gown, bed linens, chuck pad and draw sheet. Pt placed on a condom cath for incontinence as needed. Bed is locked in the lowest position, side rails x2, room darkened and door opened for better observation.

## 2020-08-04 NOTE — ED Notes (Signed)
[facility] paged @ 2003

## 2020-08-04 NOTE — ED Provider Notes (Signed)
  Provider Note MRN:  [id number]  Arrival date & time: 08/05/20    ED Course and Medical Decision Making  Assumed care from Dr. Venita Sheffield at shift change.  Initially a code stroke initiation, now thought to be intoxicated, does have a history of stroke.  Will need reevaluation after some metabolization of alcohol.  1:40 AM update: Upon reassessment patient is awake, alert, oriented to name, place, knows the president, knows what year it is, can converse and answer complex questions.  No aphasia, no lateralizing neurological findings.  All consistent with the suspicion that patient was simply intoxicated.  He is requesting discharge, he feels his normal self.  Procedures  Final Clinical Impressions(s) / ED Diagnoses     ICD-10-CM   1. Alcoholic intoxication without complication ([place])  Z99.357     ED Discharge Orders    None        Discharge Instructions     You were evaluated in the Emergency Department and after careful evaluation, we did not find any emergent condition requiring admission or further testing in the hospital.  Your exam/testing today was overall reassuring.  Please return to the Emergency Department if you experience any worsening of your condition.  Thank you for allowing [place] to be a part of your care.      Barth Kirks. [place] Small, [facility] [email or web address]    Maudie Flakes, MD 08/05/20 [phone]

## 2020-08-04 NOTE — ED Notes (Signed)
This RN rounded on patient. Found patient to be laying on right side, asleep and will open eye with continued stimuli. Pt unable to answer questions at this time. Bed is locked in the lowest position, side rails x2, door open and room darkened to promote rest.

## 2020-08-04 NOTE — ED Provider Notes (Signed)
[facility] EMERGENCY DEPARTMENT Provider Note   CSN: [id number] Arrival date & time: 08/04/20  2001     History Chief Complaint  Patient presents with  . Code Stroke    Miguel Solis is a 70 y.o. male.  Patient brought in by EMS.  Arrived as a code stroke.  Apparently patient 15 minutes prior to arrival was normal and family was there.  He started to get a facial droop and then was not able to talk.  Patient upon arrival would move both lower extremities to command and raise his arms to command.  Stick his tongue out.  Did seem to have droop to his right eyelid and the right side of his face.  Nonverbal.  Code stroke was initiated.  Patient taken immediately from bridge to head CT.  And code stroke order sets were placed        Past Medical History:  Diagnosis Date  . Colon polyp 2008  . Foley catheter in place   . Gout   . Hyperlipemia   . Hyperlipidemia   . Hypertension   . Impaired fasting glucose   . Urinary retention     Patient Active Problem List   Diagnosis Date Noted  . History of seizure 06/21/2020  . Unresolved grief 04/19/2020  . DOE (dyspnea on exertion) 01/03/2020  . Acute CVA (cerebrovascular accident) ([place]) 08/08/2018  . Right hemiparesis ([place]) 08/08/2018  . Alcohol withdrawal hallucinosis ([place]) 08/07/2018  . Hypomagnesemia 08/07/2018  . Polycythemia 08/07/2018  . Abnormal EKG 08/07/2018  . 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 BPH with urinary obstruction 05/04/2018  . Alcohol abuse 04/03/2017  . Prostate hypertrophy 01/10/2013  . Elevated PSA 12/05/2012  . Hyperlipemia 11/08/2012  . Essential hypertension 11/08/2012  . Hyperglycemia 11/08/2012    Past Surgical History:  Procedure Laterality Date  . COLONOSCOPY N/A 08/18/2017   Procedure: COLONOSCOPY;  Surgeon: Daneil Dolin, MD;  [facility];  Service: Endoscopy;  Laterality: N/A;  10:30  . KNEE SURGERY Left yrs ago   torn meniscus  . POLYPECTOMY   08/18/2017   Procedure: POLYPECTOMY;  Surgeon: Daneil Dolin, MD;  [facility];  Service: Endoscopy;;  cecal x2 (cold snare)and ascending colon x2 (hot snare)  . TRANSURETHRAL RESECTION OF PROSTATE N/A 05/04/2018   Procedure: TRANSURETHRAL RESECTION OF THE PROSTATE (TURP);  Surgeon: Kathie Rhodes, MD;  Location: [facility];  Service: Urology;  Laterality: N/A;       Family History  Problem Relation Age of Onset  . Cancer Mother     Social History   Tobacco Use  . Smoking status: Never Smoker  . Smokeless tobacco: Never Used  Vaping Use  . Vaping Use: Never used  Substance Use Topics  . Alcohol use: Yes    Comment: per EMS several bottles of wine  . Drug use: No    Home Medications Prior to Admission medications   Medication Sig Start Date End Date Taking? Authorizing Provider  aspirin 325 MG tablet Take 1 tablet (325 mg total) by mouth daily. 10/09/19   [name], MD  atorvastatin (LIPITOR) 40 MG tablet Take 1 tablet (40 mg total) by mouth daily. 06/08/20   Lovena Le, Malena M, DO  escitalopram (LEXAPRO) 10 MG tablet Take 1 tablet (10 mg total) by mouth every morning. 06/08/20   Lovena Le, Malena M, DO  lisinopril (ZESTRIL) 10 MG tablet Take 1 tablet (10 mg total) by mouth daily. 06/08/20  Lovena Le, Malena M, DO  valsartan (DIOVAN) 160 MG tablet Take 1 tablet (160 mg total) by mouth daily. Patient not taking: Reported on 04/17/2020 01/03/20   Tanda Rockers, MD    Allergies    Patient has no known allergies.  Review of Systems   Review of Systems  Unable to perform ROS: Patient nonverbal    Physical Exam Updated Vital Signs BP 90/63 (BP Location: Left Arm)   Pulse 96   Temp 98.8 F (37.1 C) (Axillary)   Resp 18   Ht 1.778 m (5\' 10" )   Wt 82.4 kg   SpO2 99%   BMI 26.07 kg/m   Physical Exam Vitals and nursing note reviewed.  Constitutional:      Appearance: He is well-developed and well-nourished.  HENT:     Head: Normocephalic and  atraumatic.  Eyes:     Extraocular Movements: Extraocular movements intact.     Conjunctiva/sclera: Conjunctivae normal.     Pupils: Pupils are equal, round, and reactive to light.  Cardiovascular:     Rate and Rhythm: Normal rate and regular rhythm.     Heart sounds: No murmur heard.   Pulmonary:     Effort: Pulmonary effort is normal. No respiratory distress.     Breath sounds: Normal breath sounds.  Abdominal:     Palpations: Abdomen is soft.     Tenderness: There is no abdominal tenderness.  Musculoskeletal:        General: No swelling, deformity or edema.     Cervical back: Normal range of motion and neck supple.  Skin:    General: Skin is warm and dry.     Capillary Refill: Capillary refill takes less than 2 seconds.  Neurological:     Mental Status: He is alert.     Cranial Nerves: Cranial nerve deficit present.     Comments: Patient appears to have right facial droop.  And is nonverbal.  Upper extremities lower extremity strength seems to be fairly normal.  Psychiatric:        Mood and Affect: Mood and affect normal.     ED Results / Procedures / Treatments   Labs (all labs ordered are listed, but only abnormal results are displayed) Labs Reviewed  ETHANOL - Abnormal; Notable for the following components:      Result Value   Alcohol, Ethyl (B) 313 (*)    All other components within normal limits  DIFFERENTIAL - Abnormal; Notable for the following components:   Lymphs Abs 4.9 (*)    Eosinophils Absolute 0.8 (*)    All other components within normal limits  COMPREHENSIVE METABOLIC PANEL - Abnormal; Notable for the following components:   Sodium 133 (*)    Potassium 3.3 (*)    Glucose, Bld 200 (*)    Total Protein 8.3 (*)    All other components within normal limits  CBC - Abnormal; Notable for the following components:   WBC 11.0 (*)    MCV 105.1 (*)    MCH 35.2 (*)    All other components within normal limits  CBG MONITORING, ED - Abnormal; Notable for the  following components:   Glucose-Capillary 167 (*)    All other components within normal limits  SARS CORONAVIRUS 2 BY RT PCR (HOSPITAL ORDER, [place])  PROTIME-INR  APTT  RAPID URINE DRUG SCREEN, HOSP PERFORMED  URINALYSIS, ROUTINE W REFLEX MICROSCOPIC    EKG EKG Interpretation  Date/Time:  Tuesday August 04 2020 20:27:47 EST  Ventricular Rate:  96 PR Interval:    QRS Duration: 80 QT Interval:  384 QTC Calculation: 486 R Axis:   75 Text Interpretation: Sinus rhythm Prolonged PR interval Minimal ST elevation, inferior leads Borderline prolonged QT interval No significant change since last tracing Confirmed by Fredia Sorrow [phone]) on 08/04/2020 9:08:09 PM   Radiology CT HEAD CODE STROKE WO CONTRAST  Result Date: 08/04/2020 CLINICAL DATA:  Code stroke.  Right-sided facial droop EXAM: CT HEAD WITHOUT CONTRAST TECHNIQUE: Contiguous axial images were obtained from the base of the skull through the vertex without intravenous contrast. COMPARISON:  03/09/2020 FINDINGS: Brain: No acute intracranial hemorrhage, mass effect, or edema. No new loss of gray-white differentiation. Chronic infarcts of the left gangliocapsular region. Additional patchy and confluent areas of hypoattenuation in the supratentorial white matter nonspecific but probably reflects stable chronic microvascular ischemic changes. Vascular: No hyperdense vessel. There is intracranial atherosclerotic calcification at the skull base. Skull: Unremarkable. Sinuses/Orbits: No acute abnormality Other: Mastoid air cells are clear. ASPECTS ([place] Stroke Program Early CT Score) - Ganglionic level infarction (caudate, lentiform nuclei, internal capsule, insula, M1-M3 cortex): 7 - Supraganglionic infarction (M4-M6 cortex): 3 Total score (0-10 with 10 being normal): 10 IMPRESSION: There is no acute intracranial hemorrhage or evidence of acute infarction. ASPECT score is 10. Stable chronic findings detailed  above. These results were called by telephone at the time of interpretation on 08/04/2020 at 8:28 pm to provider Fredia Sorrow , who verbally acknowledged these results. Electronically Signed   By: Macy Mis M.D.   On: 08/04/2020 20:31    Procedures Procedures   CRITICAL CARE Performed by: Fredia Sorrow Total critical care time: 60 minutes Critical care time was exclusive of separately billable procedures and treating other patients. Critical care was necessary to treat or prevent imminent or life-threatening deterioration. Critical care was time spent personally by me on the following activities: development of treatment plan with patient and/or surrogate as well as nursing, discussions with consultants, evaluation of patient's response to treatment, examination of patient, obtaining history from patient or surrogate, ordering and performing treatments and interventions, ordering and review of laboratory studies, ordering and review of radiographic studies, pulse oximetry and re-evaluation of patient's condition.   Medications Ordered in ED Medications  0.9 %  sodium chloride infusion ( Intravenous New Bag/Given 08/04/20 2150)    ED Course  I have reviewed the triage vital signs and the nursing notes.  Pertinent labs & imaging results that were available during my care of the patient were reviewed by me and considered in my medical decision making (see chart for details).    MDM Rules/Calculators/A&P                          Initial concern was for aphasic stroke.  But had no motor upper or lower extremity weakness.  Head CT showed no acute findings.  Patient brought back from CT to room 9 seen by teleneurologist.  Has chart review in the fall shows problems with alcohol intoxication.  Patient was also more somnolent when he came back from the CT scan.  And would not follow commands as well.  The teleneurologist felt that patient not a good candidate for TPA and that this may very  well be alcohol intoxication.  He also did not see any evidence of a van.  So the plan was to observe.  Patient's alcohol level did come back in the 300s.  Patient receiving some slow  IV fluids.  Patient is waking up a little bit more now.  He will be continued to be observed and then reevaluated once he is back to functioning level.  I would guess that if he still has abnormalities once he gets back to a functioning level either teleneurology could be reconsulted or MRI could be considered which is not available here tonight.  Patient seems to have a history of significant drinking could be some concern for alcohol withdrawal showing no evidence of that currently.     Final Clinical Impression(s) / ED Diagnoses Final diagnoses:  Alcoholic intoxication without complication [facility])    Rx / DC Orders ED Discharge Orders    None       Fredia Sorrow, MD 08/04/20 2322

## 2020-08-04 NOTE — ED Notes (Signed)
Date and time results received: 08/04/20 8:48 PM   Test: Alcohol Critical Value: 313  Name of Provider Notified: Dr. Rogene Houston  Orders Received? Or Actions Taken?: Actions Taken: No new orders at this time.

## 2020-08-04 NOTE — ED Notes (Signed)
To transported to CT by EMS, this RN and Dr. Rogene Houston at bedside.

## 2020-08-04 NOTE — ED Notes (Signed)
Spoke with daughter, Estill Bamberg, and updated her on patient status. She was informed that she would be called if anything changed with patient.

## 2020-08-04 NOTE — ED Notes (Addendum)
Pt arrives to assigned room from CT at this time. Tele-health Neurology on at bedside for assessment. Per Tele-health Neurology, pt not for tPA criteria at this time.

## 2020-08-04 NOTE — Progress Notes (Signed)
1958 beeper time 1957 exam started 1959 exam finished 2006 images sent to [facility] 2009 exam completed in epic 2009 [facility] radiology called

## 2020-08-04 NOTE — ED Triage Notes (Signed)
LATE ENTRY FOR 1955:  Pt arrival by EMS, Dr. Rogene Houston to bedside for assessment.  EMS reports that patient's right sided facial droop started 15 minutes prior to arrival.  EMS reports that family stated on scene patient was normal thoroughout the day, a family member went to ask him a question and found him with a right sided facial droop.  Pt arrives not speaking and able to follow commands.  Per Dr. Rogene Houston, pt to go to CT at this time.

## 2020-08-05 ENCOUNTER — Emergency Department (HOSPITAL_COMMUNITY)
Admission: EM | Admit: 2020-08-05 | Discharge: 2020-08-06 | Disposition: A | Discharge status: Home / Self Care | Payer: Medicare Other | Source: Home / Self Care | Attending: Emergency Medicine | Admitting: Emergency Medicine

## 2020-08-05 ENCOUNTER — Encounter (HOSPITAL_COMMUNITY): Payer: Self-pay

## 2020-08-05 DIAGNOSIS — Z79899 Other long term (current) drug therapy: Secondary | ICD-10-CM | POA: Insufficient documentation

## 2020-08-05 DIAGNOSIS — R253 Fasciculation: Secondary | ICD-10-CM | POA: Insufficient documentation

## 2020-08-05 DIAGNOSIS — R531 Weakness: Secondary | ICD-10-CM | POA: Insufficient documentation

## 2020-08-05 DIAGNOSIS — I10 Essential (primary) hypertension: Secondary | ICD-10-CM | POA: Insufficient documentation

## 2020-08-05 DIAGNOSIS — F10939 Alcohol use, unspecified with withdrawal, unspecified: Secondary | ICD-10-CM

## 2020-08-05 DIAGNOSIS — G514 Facial myokymia: Secondary | ICD-10-CM

## 2020-08-05 DIAGNOSIS — Z7982 Long term (current) use of aspirin: Secondary | ICD-10-CM | POA: Insufficient documentation

## 2020-08-05 DIAGNOSIS — F10239 Alcohol dependence with withdrawal, unspecified: Secondary | ICD-10-CM

## 2020-08-05 MED ORDER — LORAZEPAM 1 MG PO TABS
2.0000 mg | ORAL_TABLET | Freq: Once | ORAL | Status: AC
  Administered 2020-08-06: 2 mg via ORAL
  Filled 2020-08-05: qty 2

## 2020-08-05 NOTE — ED Notes (Signed)
Called daughter, Estill Bamberg, and updated her on patient status. She will be coming to pick up patient and transport him home.

## 2020-08-05 NOTE — Discharge Instructions (Addendum)
You were evaluated in the Emergency Department and after careful evaluation, we did not find any emergent condition requiring admission or further testing in the hospital.  Your exam/testing today was overall reassuring.  Please return to the Emergency Department if you experience any worsening of your condition.  Thank you for allowing us to be a part of your care.  

## 2020-08-05 NOTE — ED Triage Notes (Signed)
Pt to er, pt states that around dinner time he had some twitching in his R face, states that he had no numbness and no weakness, denies facial droop or confusion, states that he then drank a beer and the twitching went away.  Pt states that he feels normal at this time, states that he has a hx of a stroke and when he had this stoke he had some twitching so he wants to get checked out.  Pt has no facial droop or slurred speech at this time.

## 2020-08-05 NOTE — ED Provider Notes (Signed)
[facility] EMERGENCY DEPARTMENT Provider Note   CSN: [id number] Arrival date & time: 08/05/20  2009     History Chief Complaint  Patient presents with   Tremors    Miguel Solis is a 70 y.o. male.  Patient is a 70 year old male with past medical history of prior CVA, polycythemia, hypertension, and chronic alcohol abuse.  Patient presents today for evaluation of facial twitching.  Patient states that the right side of his face has been twitching since this afternoon.  Patient was seen here 24 hours ago with complaints of weakness.  He arrived as a code stroke and was determined not to be having a stroke, but was heavily intoxicated with blood alcohol level over 300.  Patient tells me he has not had anything to drink today, but normally drinks "5 or 6 beers" every day.  He denies any weakness or numbness in his arms or legs.  The history is provided by the patient.       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/05/2018   BPH with urinary obstruction 05/04/2018   Alcohol abuse 04/03/2017   Prostate hypertrophy 01/10/2013   Elevated PSA 12/05/2012   Hyperlipemia 11/08/2012   Essential hypertension 11/08/2012   Hyperglycemia 11/08/2012    Past Surgical History:  Procedure Laterality Date   COLONOSCOPY N/A 08/18/2017   Procedure: COLONOSCOPY;  Surgeon: Daneil Dolin, MD;  [facility];  Service: Endoscopy;  Laterality: N/A;  10:30   KNEE SURGERY Left yrs ago   torn meniscus   POLYPECTOMY  08/18/2017   Procedure: POLYPECTOMY;  Surgeon: Daneil Dolin, MD;  [facility];  Service: Endoscopy;;  cecal x2 (cold snare)and ascending colon x2 (hot snare)   TRANSURETHRAL RESECTION OF PROSTATE N/A 05/04/2018   Procedure: TRANSURETHRAL RESECTION OF THE PROSTATE (TURP);  Surgeon: Kathie Rhodes, MD;  Location: [facility];  Service: Urology;  Laterality: N/A;       Family History  Problem Relation Age of Onset   Cancer Mother     Social History   Tobacco Use   Smoking status: Never Smoker   Smokeless tobacco: Never Used  Vaping Use   Vaping Use: Never used  Substance Use Topics   Alcohol use: Yes    Comment: per EMS several bottles of wine   Drug use: No    Home Medications Prior to Admission medications   Medication Sig Start Date End Date Taking? Authorizing Provider  aspirin 325 MG tablet Take 1 tablet (325 mg total) by mouth daily. 10/09/19   [name], MD  atorvastatin (LIPITOR) 40 MG tablet Take 1 tablet (40 mg total) by mouth daily. 06/08/20   Lovena Le, Malena M, DO  escitalopram (LEXAPRO) 10 MG tablet Take 1 tablet (10 mg total) by mouth every morning. 06/08/20   Erven Colla, DO  lisinopril (ZESTRIL) 10 MG tablet Take 1 tablet (10 mg total) by mouth daily. 06/08/20   Lovena Le, Malena M, DO  valsartan (DIOVAN) 160 MG tablet Take 1 tablet (160 mg total) by mouth daily. Patient not taking: Reported on 04/17/2020 01/03/20   Tanda Rockers, MD    Allergies    Patient has no known allergies.  Review of Systems   Review of Systems  All other systems reviewed and are negative.   Physical Exam Updated Vital Signs BP (!) 140/121    Pulse 86    Temp 98.7 F (37.1 C) (Oral)    Resp 18    Ht 5\' 10"  (1.778 m)    Wt 76.7 kg    SpO2 98%    BMI 24.25 kg/m   Physical Exam Vitals and nursing note  reviewed.  Constitutional:      General: He is not in acute distress.    Appearance: He is well-developed and well-nourished. He is not diaphoretic.  HENT:     Head: Normocephalic and atraumatic.     Mouth/Throat:     Mouth: Oropharynx is clear and moist.  Eyes:     Extraocular Movements: Extraocular movements intact.     Pupils: Pupils are equal, round, and reactive to light.  Cardiovascular:     Rate and Rhythm: Normal rate and regular rhythm.     Heart sounds: No murmur heard. No friction rub.  Pulmonary:     Effort: Pulmonary effort is normal. No respiratory distress.     Breath sounds: Normal breath sounds. No wheezing or rales.  Abdominal:     General: Bowel sounds are normal. There is no distension.     Palpations: Abdomen is soft.     Tenderness: There is no abdominal tenderness.  Musculoskeletal:        General: No edema. Normal range of motion.     Cervical back: Normal range of motion and neck supple.  Skin:    General: Skin is warm and dry.  Neurological:     General: No focal deficit present.     Mental Status: He is alert and oriented to person, place, and time.     Cranial Nerves: No cranial nerve deficit.     Sensory: No sensory deficit.     Motor: No weakness.     Coordination: Coordination normal.     ED Results / Procedures / Treatments   Labs (all labs ordered are listed, but only abnormal results are displayed) Labs Reviewed  BASIC METABOLIC PANEL  ETHANOL  CBC WITH DIFFERENTIAL/PLATELET    EKG None  Radiology CT HEAD CODE STROKE WO CONTRAST  Result Date: 08/04/2020 CLINICAL DATA:  Code stroke.  Right-sided facial droop EXAM: CT HEAD WITHOUT CONTRAST TECHNIQUE: Contiguous axial images were obtained from the base of the skull through the vertex without intravenous contrast. COMPARISON:  03/09/2020 FINDINGS: Brain: No acute intracranial hemorrhage, mass effect, or edema. No new loss of gray-white differentiation. Chronic infarcts of the left  gangliocapsular region. Additional patchy and confluent areas of hypoattenuation in the supratentorial white matter nonspecific but probably reflects stable chronic microvascular ischemic changes. Vascular: No hyperdense vessel. There is intracranial atherosclerotic calcification at the skull base. Skull: Unremarkable. Sinuses/Orbits: No acute abnormality Other: Mastoid air cells are clear. ASPECTS [facility] Stroke Program Early CT Score) - Ganglionic level infarction (caudate, lentiform nuclei, internal capsule, insula, M1-M3 cortex): 7 - Supraganglionic infarction (M4-M6 cortex): 3 Total score (0-10 with 10 being normal): 10 IMPRESSION: There is no acute  intracranial hemorrhage or evidence of acute infarction. ASPECT score is 10. Stable chronic findings detailed above. These results were called by telephone at the time of interpretation on 08/04/2020 at 8:28 pm to provider Fredia Sorrow , who verbally acknowledged these results. Electronically Signed   By: Macy Mis M.D.   On: 08/04/2020 20:31    Procedures Procedures   Medications Ordered in ED Medications  LORazepam (ATIVAN) tablet 2 mg (has no administration in time range)    ED Course  I have reviewed the triage vital signs and the nursing notes.  Pertinent labs & imaging results that were available during my care of the patient were reviewed by me and considered in my medical decision making (see chart for details).    MDM Rules/Calculators/A&P  Patient presenting here with complaints of twitching of his face.  Patient with history of chronic alcoholism.  He was seen here yesterday for possible strokelike symptoms.  He had neurologic consultation and extensive work-up, however no definitive cause was found.  Since returning home, he has not had any alcohol, then began with the twitching he describes this evening.  Laboratory studies today are unremarkable and repeat head CT is negative.  Vitals are stable.  I suspect the twitching  he is describing is related to alcohol withdrawal.  He was given Ativan here in the ER and this seems to have subsided afterward.  Patient is interested in stopping his drinking which he tells me has become worse since his wife died last June 30, 2023.  He is involved in AA classes and tells me he will attend these.  I will prescribe Librium to help him with the withdrawal symptoms until that time.  Final Clinical Impression(s) / ED Diagnoses Final diagnoses:  None    Rx / DC Orders ED Discharge Orders    None       Veryl Speak, MD 08/06/20 [phone]

## 2020-08-05 NOTE — ED Triage Notes (Signed)
Pt vital signs retaken, pt states that the jerking movements have returned to his bilateral arms, but more on the R than the L, pt has no facial droop, no slurred speech, or confusion.

## 2020-08-06 ENCOUNTER — Encounter: Payer: Self-pay | Admitting: Internal Medicine

## 2020-08-06 ENCOUNTER — Emergency Department (HOSPITAL_COMMUNITY): Payer: Medicare Other

## 2020-08-06 DIAGNOSIS — J3489 Other specified disorders of nose and nasal sinuses: Secondary | ICD-10-CM | POA: Diagnosis not present

## 2020-08-06 DIAGNOSIS — I739 Peripheral vascular disease, unspecified: Secondary | ICD-10-CM | POA: Diagnosis not present

## 2020-08-06 DIAGNOSIS — I672 Cerebral atherosclerosis: Secondary | ICD-10-CM | POA: Diagnosis not present

## 2020-08-06 LAB — BASIC METABOLIC PANEL
Anion gap: 12 (ref 5–15)
BUN: 12 mg/dL (ref 8–23)
CO2: 26 mmol/L (ref 22–32)
Calcium: 9.8 mg/dL (ref 8.9–10.3)
Chloride: 99 mmol/L (ref 98–111)
Creatinine, Ser: 0.92 mg/dL (ref 0.61–1.24)
GFR, Estimated: >60 mL/min (ref >60)
Glucose, Bld: 132 mg/dL — ABNORMAL HIGH (ref 70–99)
Potassium: 3.8 mmol/L (ref 3.5–5.1)
Sodium: 137 mmol/L (ref 135–145)

## 2020-08-06 LAB — CBC WITH DIFFERENTIAL/PLATELET
Abs Immature Granulocytes: 0.07 10*3/uL (ref 0.00–0.07)
Basophils Absolute: 0.1 10*3/uL (ref 0.0–0.1)
Basophils Relative: 1 %
Eosinophils Absolute: 0.5 10*3/uL (ref 0.0–0.5)
Eosinophils Relative: 4 %
HCT: 52.2 % — ABNORMAL HIGH (ref 39.0–52.0)
Hemoglobin: 17.2 g/dL — ABNORMAL HIGH (ref 13.0–17.0)
Immature Granulocytes: 1 %
Lymphocytes Relative: 19 %
Lymphs Abs: 2.6 10*3/uL (ref 0.7–4.0)
MCH: 34.3 pg — ABNORMAL HIGH (ref 26.0–34.0)
MCHC: 33 g/dL (ref 30.0–36.0)
MCV: 104 fL — ABNORMAL HIGH (ref 80.0–100.0)
Monocytes Absolute: 1 10*3/uL (ref 0.1–1.0)
Monocytes Relative: 7 %
Neutro Abs: 9.7 10*3/uL — ABNORMAL HIGH (ref 1.7–7.7)
Neutrophils Relative %: 68 %
Platelets: 293 10*3/uL (ref 150–400)
RBC: 5.02 MIL/uL (ref 4.22–5.81)
RDW: 12.3 % (ref 11.5–15.5)
WBC: 13.9 10*3/uL — ABNORMAL HIGH (ref 4.0–10.5)
nRBC: 0 % (ref 0.0–0.2)

## 2020-08-06 LAB — ETHANOL: Alcohol, Ethyl (B): <10 mg/dL (ref <10)

## 2020-08-06 MED ORDER — CHLORDIAZEPOXIDE HCL 25 MG PO CAPS: ORAL_CAPSULE | ORAL | 0 refills | Status: DC

## 2020-08-06 NOTE — ED Notes (Signed)
Patient transported to CT 

## 2020-08-06 NOTE — Discharge Instructions (Signed)
Begin taking Librium as prescribed.  Follow-up with alcohol treatment as so desired.  The resource guide for local facilities has been provided in this discharge summary for you to call and make these arrangements.

## 2020-11-16 ENCOUNTER — Other Ambulatory Visit: Payer: Self-pay | Admitting: Family Medicine

## 2020-11-16 ENCOUNTER — Telehealth: Payer: Self-pay | Admitting: Family Medicine

## 2020-11-16 DIAGNOSIS — I10 Essential (primary) hypertension: Secondary | ICD-10-CM

## 2020-11-16 DIAGNOSIS — Z79899 Other long term (current) drug therapy: Secondary | ICD-10-CM

## 2020-11-16 DIAGNOSIS — F32A Depression, unspecified: Secondary | ICD-10-CM

## 2020-11-16 DIAGNOSIS — R972 Elevated prostate specific antigen [PSA]: Secondary | ICD-10-CM

## 2020-11-16 DIAGNOSIS — E785 Hyperlipidemia, unspecified: Secondary | ICD-10-CM

## 2020-11-16 NOTE — Telephone Encounter (Signed)
Last labs 2/22:  Ethanol, INR, APTT, CBC, CMET

## 2020-11-16 NOTE — Telephone Encounter (Signed)
Pt wants to know if he needs blood work for appt

## 2020-11-16 NOTE — Telephone Encounter (Signed)
Patient is requesting refill on escitalpram 10 mg 90 pills and las t seen 12//6/21 and last filled 06/08/20

## 2020-11-17 MED ORDER — ESCITALOPRAM OXALATE 10 MG PO TABS: 10.0000 mg | ORAL_TABLET | ORAL | 0 refills | Status: DC

## 2020-11-17 NOTE — Telephone Encounter (Signed)
Pls order cbc, cmp, lipids, and psa. Thx. Dr. Harlow Carrizales  

## 2020-11-17 NOTE — Telephone Encounter (Signed)
Lab orders placed and pt is aware 

## 2020-11-24 DIAGNOSIS — Z79899 Other long term (current) drug therapy: Secondary | ICD-10-CM | POA: Diagnosis not present

## 2020-11-24 DIAGNOSIS — E785 Hyperlipidemia, unspecified: Secondary | ICD-10-CM | POA: Diagnosis not present

## 2020-11-24 DIAGNOSIS — R972 Elevated prostate specific antigen [PSA]: Secondary | ICD-10-CM | POA: Diagnosis not present

## 2020-11-25 LAB — CBC WITH DIFFERENTIAL/PLATELET
Basophils Absolute: 0.1 10*3/uL (ref 0.0–0.2)
Basos: 1 %
EOS (ABSOLUTE): 2.1 10*3/uL — ABNORMAL HIGH (ref 0.0–0.4)
Eos: 16 %
Hematocrit: 45 % (ref 37.5–51.0)
Hemoglobin: 15.3 g/dL (ref 13.0–17.7)
Immature Grans (Abs): 0.1 10*3/uL (ref 0.0–0.1)
Immature Granulocytes: 1 %
Lymphocytes Absolute: 3.5 10*3/uL — ABNORMAL HIGH (ref 0.7–3.1)
Lymphs: 27 %
MCH: 33.2 pg — ABNORMAL HIGH (ref 26.6–33.0)
MCHC: 34 g/dL (ref 31.5–35.7)
MCV: 98 fL — ABNORMAL HIGH (ref 79–97)
Monocytes Absolute: 0.8 10*3/uL (ref 0.1–0.9)
Monocytes: 6 %
Neutrophils Absolute: 6.5 10*3/uL (ref 1.4–7.0)
Neutrophils: 49 %
Platelets: 389 10*3/uL (ref 150–450)
RBC: 4.61 x10E6/uL (ref 4.14–5.80)
RDW: 11.8 % (ref 11.6–15.4)
WBC: 13.1 10*3/uL — ABNORMAL HIGH (ref 3.4–10.8)

## 2020-11-25 LAB — COMPREHENSIVE METABOLIC PANEL
ALT: 9 IU/L (ref 0–44)
AST: 14 IU/L (ref 0–40)
Albumin/Globulin Ratio: 1.1 — ABNORMAL LOW (ref 1.2–2.2)
Albumin: 4.1 g/dL (ref 3.8–4.8)
Alkaline Phosphatase: 129 IU/L — ABNORMAL HIGH (ref 44–121)
BUN/Creatinine Ratio: 10 (ref 10–24)
BUN: 9 mg/dL (ref 8–27)
Bilirubin Total: 0.7 mg/dL (ref 0.0–1.2)
CO2: 24 mmol/L (ref 20–29)
Calcium: 9.7 mg/dL (ref 8.6–10.2)
Chloride: 98 mmol/L (ref 96–106)
Creatinine, Ser: 0.87 mg/dL (ref 0.76–1.27)
Globulin, Total: 3.9 g/dL (ref 1.5–4.5)
Glucose: 131 mg/dL — ABNORMAL HIGH (ref 65–99)
Potassium: 4.3 mmol/L (ref 3.5–5.2)
Sodium: 138 mmol/L (ref 134–144)
Total Protein: 8 g/dL (ref 6.0–8.5)
eGFR: 93 mL/min/{1.73_m2} (ref >59)

## 2020-11-25 LAB — LIPID PANEL
Chol/HDL Ratio: 3.2 ratio (ref 0.0–5.0)
Cholesterol, Total: 160 mg/dL (ref 100–199)
HDL: 50 mg/dL (ref >39)
LDL Chol Calc (NIH): 92 mg/dL (ref 0–99)
Triglycerides: 97 mg/dL (ref 0–149)
VLDL Cholesterol Cal: 18 mg/dL (ref 5–40)

## 2020-11-25 LAB — PSA: Prostate Specific Ag, Serum: 1.5 ng/mL (ref 0.0–4.0)

## 2020-12-02 ENCOUNTER — Encounter: Payer: Self-pay | Admitting: Family Medicine

## 2020-12-02 ENCOUNTER — Ambulatory Visit (INDEPENDENT_AMBULATORY_CARE_PROVIDER_SITE_OTHER): Payer: Medicare Other | Admitting: Family Medicine

## 2020-12-02 ENCOUNTER — Other Ambulatory Visit: Payer: Self-pay

## 2020-12-02 VITALS — BP 91/62 | HR 103 | Temp 98.2°F | Ht 70.0 in | Wt 184.4 lb

## 2020-12-02 DIAGNOSIS — E785 Hyperlipidemia, unspecified: Secondary | ICD-10-CM

## 2020-12-02 DIAGNOSIS — Z8673 Personal history of transient ischemic attack (TIA), and cerebral infarction without residual deficits: Secondary | ICD-10-CM

## 2020-12-02 DIAGNOSIS — I10 Essential (primary) hypertension: Secondary | ICD-10-CM

## 2020-12-02 DIAGNOSIS — F32A Depression, unspecified: Secondary | ICD-10-CM

## 2020-12-02 DIAGNOSIS — K029 Dental caries, unspecified: Secondary | ICD-10-CM | POA: Diagnosis not present

## 2020-12-02 DIAGNOSIS — D72829 Elevated white blood cell count, unspecified: Secondary | ICD-10-CM

## 2020-12-02 MED ORDER — ESCITALOPRAM OXALATE 10 MG PO TABS: 10.0000 mg | ORAL_TABLET | ORAL | 7 refills | Status: DC

## 2020-12-02 MED ORDER — LISINOPRIL 10 MG PO TABS
1.0000 | ORAL_TABLET | Freq: Every day | ORAL | 2 refills | Status: AC
Start: 2020-12-02 — End: ?

## 2020-12-02 MED ORDER — ATORVASTATIN CALCIUM 40 MG PO TABS
1.0000 | ORAL_TABLET | Freq: Every day | ORAL | 2 refills | Status: AC
Start: 2020-12-02 — End: ?

## 2020-12-02 NOTE — Progress Notes (Signed)
Patient ID: Miguel Solis, male    DOB: February 16, 1951, 70 y.o.   MRN: [id number]   Chief Complaint  Patient presents with   Hypertension   Subjective:  Doing well with no problems or concerns.  Seen today with daughter.  HPI F/u htn-  H/o Alcohol abuse- Getting 42 oz per day of beer per the daughter.  She's working on decreasing his alcohol intake.   Depression- no new concerns.  Feel medication is helping. Still taking lexapro daily.  Pt has slight elevated wbc on review of labs, not ill recently.  Has some dental caries.  Has a dentist.  No fevers or feeling ill.   Pt had CVA in 2/22.  Pt also went to ER for alcohol withdrawal and was trying to quit drinking, took Librium, but then has restarted back on alcohol.  Daughter stating giving him 1 large beer per day to keep him from withdrawing.  Medical History Miguel Solis has a past medical history of Colon polyp (2008), Foley catheter in place, Gout, Hyperlipemia, Hyperlipidemia, Hypertension, Impaired fasting glucose, and Urinary retention.   Outpatient Encounter Medications as of 12/02/2020  Medication Sig   aspirin 325 MG tablet Take 1 tablet (325 mg total) by mouth daily.   atorvastatin (LIPITOR) 40 MG tablet Take 1 tablet (40 mg total) by mouth daily.   escitalopram (LEXAPRO) 10 MG tablet Take 1 tablet (10 mg total) by mouth every morning.   lisinopril (ZESTRIL) 10 MG tablet Take 1 tablet (10 mg total) by mouth daily.   [DISCONTINUED] atorvastatin (LIPITOR) 40 MG tablet TAKE 1 TABLET BY MOUTH EVERY DAY   [DISCONTINUED] chlordiazePOXIDE (LIBRIUM) 25 MG capsule 50mg  PO TID x 1D, then 25-50mg  PO BID X 1D, then 25-50mg  PO QD X 1D   [DISCONTINUED] escitalopram (LEXAPRO) 10 MG tablet Take 1 tablet (10 mg total) by mouth every morning.   [DISCONTINUED] lisinopril (ZESTRIL) 10 MG tablet TAKE 1 TABLET BY MOUTH EVERY DAY   [DISCONTINUED] valsartan (DIOVAN) 160 MG tablet Take 1 tablet (160 mg total) by mouth daily. (Patient not taking:  Reported on 04/17/2020)   No facility-administered encounter medications on file as of 12/02/2020.     Review of Systems  Constitutional:  Negative for chills and fever.  HENT:  Negative for congestion, rhinorrhea and sore throat.   Respiratory:  Negative for cough, shortness of breath and wheezing.   Cardiovascular:  Negative for chest pain and leg swelling.  Gastrointestinal:  Negative for abdominal pain, diarrhea, nausea and vomiting.  Genitourinary:  Negative for dysuria and frequency.  Skin:  Negative for rash.  Neurological:  Negative for dizziness, weakness and headaches.    Vitals BP 91/62   Pulse (!) 103   Temp 98.2 F (36.8 C) (Oral)   Ht 5\' 10"  (1.778 m)   Wt 184 lb 6.4 oz (83.6 kg)   SpO2 96%   BMI 26.46 kg/m   Objective:   Physical Exam Vitals and nursing note reviewed.  Constitutional:      General: He is not in acute distress.    Appearance: Normal appearance. He is not ill-appearing.  HENT:     Head: Normocephalic.  Eyes:     Extraocular Movements: Extraocular movements intact.     Conjunctiva/sclera: Conjunctivae normal.     Pupils: Pupils are equal, round, and reactive to light.  Cardiovascular:     Rate and Rhythm: Normal rate and regular rhythm.     Pulses: Normal pulses.     Heart sounds: Normal heart  sounds. No murmur heard. Pulmonary:     Effort: Pulmonary effort is normal.     Breath sounds: Normal breath sounds. No wheezing, rhonchi or rales.  Musculoskeletal:        General: Normal range of motion.     Right lower leg: No edema.     Left lower leg: No edema.  Skin:    General: Skin is warm and dry.     Findings: No rash.  Neurological:     General: No focal deficit present.     Mental Status: He is alert and oriented to person, place, and time.     Cranial Nerves: No cranial nerve deficit.  Psychiatric:        Mood and Affect: Mood normal.        Behavior: Behavior normal.        Thought Content: Thought content normal.         Judgment: Judgment normal.     Assessment and Plan   1. Essential hypertension - lisinopril (ZESTRIL) 10 MG tablet; Take 1 tablet (10 mg total) by mouth daily.  Dispense: 90 tablet; Refill: 2  2. Hyperlipidemia, unspecified hyperlipidemia type - atorvastatin (LIPITOR) 40 MG tablet; Take 1 tablet (40 mg total) by mouth daily.  Dispense: 90 tablet; Refill: 2  3. Depression, unspecified depression type - escitalopram (LEXAPRO) 10 MG tablet; Take 1 tablet (10 mg total) by mouth every morning.  Dispense: 30 tablet; Refill: 7  4. Dental caries  5. Leukocytosis, unspecified type  6. History of CVA (cerebrovascular accident)   Htn- stable. Cont meds.  Alcohol abuse- cont to monitor and cont to have daughter monitor the intake if pt is unable to fully quit drinking.  Depression- stable. Cont meds.  Dental caries- may be related to the elevated wbc, advising f/u with dentist to see about removal of decayed teeth.  Hld- stable. Cont meds  H/o cva- cont lisinopril, atorvastatin and asa.  F/u 88mo.  Return in about 6 months (around 06/03/2021) for f/u htn, hld, depression.

## 2020-12-11 ENCOUNTER — Telehealth: Payer: Self-pay | Admitting: Family Medicine

## 2020-12-11 NOTE — Telephone Encounter (Signed)
I attempted to leave message for patient to call back and schedule Medicare Annual Wellness Visit (AWV), but mailbox is full.   If patient returns my call, please offer to do virtually or by telephone.   Due for AWVI  Please schedule at anytime with Nurse Health Advisor.

## 2020-12-19 DIAGNOSIS — F32A Depression, unspecified: Secondary | ICD-10-CM | POA: Insufficient documentation

## 2021-01-22 ENCOUNTER — Ambulatory Visit: Payer: Medicare Other | Admitting: Family Medicine

## 2021-01-22 ENCOUNTER — Encounter: Payer: Self-pay | Admitting: Family Medicine

## 2021-02-03 ENCOUNTER — Ambulatory Visit: Payer: Medicare Other | Admitting: Family Medicine

## 2021-06-12 ENCOUNTER — Other Ambulatory Visit: Payer: Self-pay | Admitting: Family Medicine

## 2021-06-12 DIAGNOSIS — F32A Depression, unspecified: Secondary | ICD-10-CM

## 2021-06-17 ENCOUNTER — Other Ambulatory Visit: Payer: Self-pay

## 2021-06-17 ENCOUNTER — Ambulatory Visit (INDEPENDENT_AMBULATORY_CARE_PROVIDER_SITE_OTHER): Payer: Medicare Other | Admitting: Family Medicine

## 2021-06-17 VITALS — BP 107/71 | HR 78 | Ht 70.0 in | Wt 200.2 lb

## 2021-06-17 DIAGNOSIS — R739 Hyperglycemia, unspecified: Secondary | ICD-10-CM

## 2021-06-17 DIAGNOSIS — M545 Low back pain, unspecified: Secondary | ICD-10-CM

## 2021-06-17 DIAGNOSIS — Z862 Personal history of diseases of the blood and blood-forming organs and certain disorders involving the immune mechanism: Secondary | ICD-10-CM

## 2021-06-17 DIAGNOSIS — E785 Hyperlipidemia, unspecified: Secondary | ICD-10-CM

## 2021-06-17 DIAGNOSIS — I10 Essential (primary) hypertension: Secondary | ICD-10-CM | POA: Diagnosis not present

## 2021-06-17 DIAGNOSIS — F1010 Alcohol abuse, uncomplicated: Secondary | ICD-10-CM

## 2021-06-17 NOTE — Patient Instructions (Signed)
Labs once you've fasted.  Xray at your convenience.  We will call with lab results.  Follow up in six months.  Dr. Lacinda Axon

## 2021-06-18 DIAGNOSIS — M545 Low back pain, unspecified: Secondary | ICD-10-CM | POA: Insufficient documentation

## 2021-06-18 NOTE — Assessment & Plan Note (Signed)
X-ray for further valuation.

## 2021-06-18 NOTE — Assessment & Plan Note (Signed)
Lipid panel ordered.  Continue atorvastatin 40 mg daily at this time.  May need increase in therapy.

## 2021-06-18 NOTE — Assessment & Plan Note (Signed)
At goal on lisinopril. Continue. 

## 2021-06-18 NOTE — Progress Notes (Signed)
Subjective:  Patient ID: Miguel Solis, male    DOB: 06/26/1951  Age: 70 y.o. MRN: [id number]  CC: Chief Complaint  Patient presents with   Hypertension    Follow up- due labs Establish care No problems or concerns    HPI:  70 year old male with hypertension, history of CVA, alcohol abuse, depression, hyperlipidemia presents for follow-up.  Patient reports that he has had low back pain for the past 2 weeks.  He states that he has had issues in the past and was seen by a neurosurgeon given an injection in his spine.  No radicular symptoms.  No relieving factors.  Patient has a history of alcohol abuse.  He has now cut back to 1 drink a day.  He is doing well at this time.  Patient's hyperlipidemia has been fairly well controlled as of most recent lipid panel.  I would prefer his LDL to be under 70 given his prior history of stroke.  He is taking Lipitor 40 mg daily without any difficulty.  Patient's hypertension hypertension is well controlled on lisinopril.  No reports of adverse side effects.  Patient Active Problem List   Diagnosis Date Noted   Acute low back pain without sciatica 06/18/2021   Hyperlipidemia 06/17/2021   Depression 12/19/2020   History of seizure 06/21/2020   History of cardioembolic cerebrovascular accident (CVA) 08/08/2018   Right hemiparesis ([place]) 08/08/2018   BPH with obstruction/lower urinary tract symptoms 05/05/2018   Alcohol abuse 04/03/2017   Elevated PSA 12/05/2012   Essential hypertension 11/08/2012    Social Hx   Social History   Socioeconomic History   Marital status: Married    Spouse name: Not on file   Number of children: Not on file   Years of education: Not on file   Highest education level: Not on file  Occupational History   Not on file  Tobacco Use   Smoking status: Never   Smokeless tobacco: Never  Vaping Use   Vaping Use: Never used  Substance and Sexual Activity   Alcohol use: Yes    Comment: per EMS several bottles  of wine   Drug use: No   Sexual activity: Not on file  Other Topics Concern   Not on file  Social History Narrative   Not on file   Social Determinants of Health   Financial Resource Strain: Not on file  Food Insecurity: Not on file  Transportation Needs: Not on file  Physical Activity: Not on file  Stress: Not on file  Social Connections: Not on file    Review of Systems  Constitutional: Negative.   Musculoskeletal:  Positive for back pain.    Objective:  BP 107/71    Pulse 78    Ht 5' 10" (1.778 m)    Wt 200 lb 3.2 oz (90.8 kg)    BMI 28.73 kg/m   BP/Weight 06/17/2021 02/07/5642 09/02/9516  Systolic BP 841 91 660  Diastolic BP 71 62 82  Wt. (Lbs) 200.2 184.4 -  BMI 28.73 26.46 -  Some encounter information is confidential and restricted. Go to Review Flowsheets activity to see all data.    Physical Exam Constitutional:      General: He is not in acute distress.    Appearance: Normal appearance. He is not ill-appearing.  HENT:     Head: Normocephalic and atraumatic.  Eyes:     General:        Right eye: No discharge.  Left eye: No discharge.     Conjunctiva/sclera: Conjunctivae normal.  Cardiovascular:     Rate and Rhythm: Normal rate and regular rhythm.     Heart sounds: No murmur heard. Pulmonary:     Effort: Pulmonary effort is normal.     Breath sounds: Normal breath sounds. No wheezing, rhonchi or rales.  Musculoskeletal:     Comments: No tenderness of the lumbar spine.  Neurological:     Mental Status: He is alert.  Psychiatric:        Mood and Affect: Mood normal.        Behavior: Behavior normal.    Lab Results  Component Value Date   WBC 13.1 (H) 11/24/2020   HGB 15.3 11/24/2020   HCT 45.0 11/24/2020   PLT 389 11/24/2020   GLUCOSE 131 (H) 11/24/2020   CHOL 160 11/24/2020   TRIG 97 11/24/2020   HDL 50 11/24/2020   LDLCALC 92 11/24/2020   ALT 9 11/24/2020   AST 14 11/24/2020   NA 138 11/24/2020   K 4.3 11/24/2020   CL 98 11/24/2020    CREATININE 0.87 11/24/2020   BUN 9 11/24/2020   CO2 24 11/24/2020   PSA 4.26 (H) 11/09/2012   INR 1.0 08/04/2020   HGBA1C 6.7 (H) 08/09/2018     Assessment & Plan:   Problem List Items Addressed This Visit       Cardiovascular and Mediastinum   Essential hypertension    At goal on lisinopril.  Continue.      Relevant Orders   CMP14+EGFR     Other   Alcohol abuse    Patient is down to 1 beer a day.  Congratulated.       Hyperlipidemia    Lipid panel ordered.  Continue atorvastatin 40 mg daily at this time.  May need increase in therapy.      Relevant Orders   Lipid panel   Acute low back pain without sciatica - Primary    X-ray for further valuation.      Relevant Orders   DG Lumbar Spine Complete   Other Visit Diagnoses     History of polycythemia       Relevant Orders   CBC   Blood glucose elevated       Relevant Orders   Hemoglobin A1c      Follow-up:  Return in about 6 months (around 12/16/2021).  [place]

## 2021-06-18 NOTE — Assessment & Plan Note (Signed)
Patient is down to 1 beer a day.  Congratulated.

## 2021-07-06 ENCOUNTER — Other Ambulatory Visit: Payer: Self-pay

## 2021-07-06 ENCOUNTER — Ambulatory Visit
Admission: EM | Admit: 2021-07-06 | Discharge: 2021-07-06 | Disposition: A | Discharge status: Home / Self Care | Payer: Medicare Other | Attending: Urgent Care | Admitting: Urgent Care

## 2021-07-06 DIAGNOSIS — J3489 Other specified disorders of nose and nasal sinuses: Secondary | ICD-10-CM

## 2021-07-06 DIAGNOSIS — R052 Subacute cough: Secondary | ICD-10-CM

## 2021-07-06 DIAGNOSIS — R051 Acute cough: Secondary | ICD-10-CM | POA: Diagnosis not present

## 2021-07-06 DIAGNOSIS — B349 Viral infection, unspecified: Secondary | ICD-10-CM | POA: Diagnosis not present

## 2021-07-06 MED ORDER — PSEUDOEPHEDRINE HCL 30 MG PO TABS: 30.0000 mg | ORAL_TABLET | Freq: Three times a day (TID) | ORAL | 0 refills | Status: DC | PRN

## 2021-07-06 MED ORDER — PROMETHAZINE-DM 6.25-15 MG/5ML PO SYRP: 5.0000 mL | ORAL_SOLUTION | Freq: Every evening | ORAL | 0 refills | Status: DC | PRN

## 2021-07-06 MED ORDER — CETIRIZINE HCL 10 MG PO TABS: 10.0000 mg | ORAL_TABLET | Freq: Every day | ORAL | 0 refills | Status: AC

## 2021-07-06 MED ORDER — BENZONATATE 100 MG PO CAPS: 100.0000 mg | ORAL_CAPSULE | Freq: Three times a day (TID) | ORAL | 0 refills | Status: DC | PRN

## 2021-07-06 NOTE — ED Triage Notes (Signed)
Pt reports cough, runny nose, nasal congestion x 2 days.

## 2021-07-06 NOTE — Discharge Instructions (Addendum)
We will notify you of your test results as they arrive and may take between 48-72 hours.  I encourage you to sign up for MyChart if you have not already done so as this can be the easiest way for [place] to communicate results to you online or through a phone app.  Generally, we only contact you if it is a positive test result.  In the meantime, if you develop worsening symptoms including fever, chest pain, shortness of breath despite our current treatment plan then please report to the emergency room as this may be a sign of worsening status from possible viral infection.  Otherwise, we will manage this as a viral syndrome. For sore throat or cough try using a honey-based tea. Use 3 teaspoons of honey with juice squeezed from half lemon. Place shaved pieces of ginger into 1/2-1 cup of water and warm over stove top. Then mix the ingredients and repeat every 4 hours as needed. Please take Tylenol 500mg -650mg  every 6 hours for aches and pains, fevers. Hydrate very well with at least 2 liters of water. Eat light meals such as soups to replenish electrolytes and soft fruits, veggies. Start an antihistamine like Zyrtec for postnasal drainage, sinus congestion.  You can take this together with pseudoephedrine (Sudafed) at a dose of 30 mg 2-3 times a day as needed for the same kind of congestion.  Use the cough medications as you need them.

## 2021-07-06 NOTE — ED Provider Notes (Signed)
[facility]   MRN: [id number] DOB: 07-16-1950  Subjective:   Miguel Solis is a 71 y.o. male presenting for 2-day history of acute onset runny and stuffy nose, coughing, slight difficulty with his breathing.  No history of pulmonary embolism, heart disease, MI.  No chest pain, wheezing, body aches.  Patient would like to be checked for COVID and flu.  No history of CKD.  He is not opposed to having blood work drawn for this.  No current facility-administered medications for this encounter.  Current Outpatient Medications:    atorvastatin (LIPITOR) 40 MG tablet, Take 1 tablet (40 mg total) by mouth daily., Disp: 90 tablet, Rfl: 2   escitalopram (LEXAPRO) 10 MG tablet, TAKE 1 TABLET BY MOUTH EVERY DAY IN THE MORNING, Disp: 30 tablet, Rfl: 0   lisinopril (ZESTRIL) 10 MG tablet, Take 1 tablet (10 mg total) by mouth daily., Disp: 90 tablet, Rfl: 2   No Known Allergies  Past Medical History:  Diagnosis Date   Colon polyp 2008   Foley catheter in place    Gout    Hyperlipemia    Hyperlipidemia    Hypertension    Impaired fasting glucose    Urinary retention      Past Surgical History:  Procedure Laterality Date   COLONOSCOPY N/A 08/18/2017   Procedure: COLONOSCOPY;  Surgeon: Daneil Dolin, MD;  Location: [facility];  Service: Endoscopy;  Laterality: N/A;  10:30   KNEE SURGERY Left yrs ago   torn meniscus   POLYPECTOMY  08/18/2017   Procedure: POLYPECTOMY;  Surgeon: Daneil Dolin, MD;  Location: [facility];  Service: Endoscopy;;  cecal x2 (cold snare)and ascending colon x2 (hot snare)   TRANSURETHRAL RESECTION OF PROSTATE N/A 05/04/2018   Procedure: TRANSURETHRAL RESECTION OF THE PROSTATE (TURP);  Surgeon: Kathie Rhodes, MD;  Location[facility];  Service: Urology;  Laterality: N/A;    Family History  Problem Relation Age of Onset   Cancer Mother     Social History   Tobacco Use   Smoking status: Never   Smokeless tobacco: Never   Vaping Use   Vaping Use: Never used  Substance Use Topics   Alcohol use: Yes    Comment: per EMS several bottles of wine   Drug use: No    ROS   Objective:   Vitals: BP 130/81 (BP Location: Right Arm)    Pulse 83    Temp 98.8 F (37.1 C) (Oral)    Resp 19    SpO2 96%   Physical Exam Constitutional:      General: He is not in acute distress.    Appearance: Normal appearance. He is well-developed and normal weight. He is not ill-appearing, toxic-appearing or diaphoretic.  HENT:     Head: Normocephalic and atraumatic.     Right Ear: Tympanic membrane, ear canal and external ear normal. There is no impacted cerumen.     Left Ear: Tympanic membrane, ear canal and external ear normal. There is no impacted cerumen.     Nose: Congestion present. No rhinorrhea.     Mouth/Throat:     Mouth: Mucous membranes are moist.     Pharynx: No oropharyngeal exudate or posterior oropharyngeal erythema.     Comments: Postnasal drainage overlying pharynx. Eyes:     General: No scleral icterus.       Right eye: No discharge.        Left eye: No discharge.     Extraocular Movements:  Extraocular movements intact.     Conjunctiva/sclera: Conjunctivae normal.     Pupils: Pupils are equal, round, and reactive to light.  Cardiovascular:     Rate and Rhythm: Normal rate and regular rhythm.     Heart sounds: Normal heart sounds. No murmur heard.   No friction rub. No gallop.  Pulmonary:     Effort: Pulmonary effort is normal. No respiratory distress.     Breath sounds: Normal breath sounds. No stridor. No wheezing, rhonchi or rales.  Musculoskeletal:     Cervical back: Normal range of motion and neck supple. No rigidity. No muscular tenderness.  Neurological:     General: No focal deficit present.     Mental Status: He is alert and oriented to person, place, and time.  Psychiatric:        Mood and Affect: Mood normal.        Behavior: Behavior normal.        Thought Content: Thought content  normal.    Assessment and Plan :   PDMP not reviewed this encounter.  1. Acute viral syndrome   2. Acute cough   3. Subacute cough   4. Stuffy and runny nose    Deferred imaging given clear cardiopulmonary exam, hemodynamically stable vital signs.  Will obtain basic metabolic panel for his kidney function.  Should his COVID test be positive, patient would like COVID antivirals and therefore this would be useful.  Use supportive care otherwise.  Testing pending. Counseled patient on potential for adverse effects with medications prescribed/recommended today, ER and return-to-clinic precautions discussed, patient verbalized understanding.    Jaynee Eagles, [place] 07/06/21 1840

## 2021-07-07 LAB — BASIC METABOLIC PANEL
BUN/Creatinine Ratio: 14 (ref 10–24)
BUN: 11 mg/dL (ref 8–27)
CO2: 16 mmol/L — ABNORMAL LOW (ref 20–29)
Calcium: 8.7 mg/dL (ref 8.6–10.2)
Chloride: 104 mmol/L (ref 96–106)
Creatinine, Ser: 0.77 mg/dL (ref 0.76–1.27)
Glucose: 148 mg/dL — ABNORMAL HIGH (ref 70–99)
Potassium: 4 mmol/L (ref 3.5–5.2)
Sodium: 138 mmol/L (ref 134–144)
eGFR: 96 mL/min/{1.73_m2} (ref >59)

## 2021-07-07 LAB — COVID-19, FLU A+B NAA
Influenza A, NAA: NOT DETECTED
Influenza B, NAA: NOT DETECTED
SARS-CoV-2, NAA: NOT DETECTED

## 2021-07-13 ENCOUNTER — Ambulatory Visit: Payer: Medicare Other | Admitting: Family Medicine

## 2021-07-13 ENCOUNTER — Encounter: Payer: Self-pay | Admitting: Family Medicine

## 2021-09-08 ENCOUNTER — Telehealth: Payer: Self-pay | Admitting: Family Medicine

## 2021-09-08 DIAGNOSIS — F32A Depression, unspecified: Secondary | ICD-10-CM

## 2021-09-08 MED ORDER — ESCITALOPRAM OXALATE 10 MG PO TABS: ORAL_TABLET | ORAL | 1 refills | Status: DC

## 2021-09-08 NOTE — Telephone Encounter (Signed)
Daughter called requesting a refill on escitalopram (LEXAPRO) 10 MG tablet [[id number]]  ?  Order Details ?Dose, Route, Frequency: As Directed  ?Dispense Quantity: 30 tablet Refills: 0   ?     ?Sig: TAKE 1 TABLET BY MOUTH EVERY DAY IN THE MORNING  ?Patient is completely out. He uses [facility] in [place] ? ?CB# [phone]  ?

## 2021-09-08 NOTE — Telephone Encounter (Addendum)
Prescription sent electronically to pharmacy. Notified ?

## 2021-09-11 ENCOUNTER — Encounter (HOSPITAL_COMMUNITY): Payer: Self-pay | Admitting: Emergency Medicine

## 2021-09-11 ENCOUNTER — Other Ambulatory Visit: Payer: Self-pay

## 2021-09-11 ENCOUNTER — Emergency Department (HOSPITAL_COMMUNITY)
Admission: EM | Admit: 2021-09-11 | Discharge: 2021-09-11 | Disposition: A | Discharge status: Home / Self Care | Payer: Medicare Other | Attending: Emergency Medicine | Admitting: Emergency Medicine

## 2021-09-11 DIAGNOSIS — S025XXA Fracture of tooth (traumatic), initial encounter for closed fracture: Secondary | ICD-10-CM | POA: Insufficient documentation

## 2021-09-11 DIAGNOSIS — R220 Localized swelling, mass and lump, head: Secondary | ICD-10-CM

## 2021-09-11 DIAGNOSIS — K0889 Other specified disorders of teeth and supporting structures: Secondary | ICD-10-CM

## 2021-09-11 DIAGNOSIS — X58XXXA Exposure to other specified factors, initial encounter: Secondary | ICD-10-CM | POA: Insufficient documentation

## 2021-09-11 MED ORDER — AMOXICILLIN-POT CLAVULANATE 875-125 MG PO TABS: 1.0000 | ORAL_TABLET | Freq: Two times a day (BID) | ORAL | 0 refills | Status: DC

## 2021-09-11 MED ORDER — IBUPROFEN 100 MG/5ML PO SUSP
800.0000 mg | Freq: Once | ORAL | Status: AC
  Administered 2021-09-11: 800 mg via ORAL
  Filled 2021-09-11: qty 40

## 2021-09-11 MED ORDER — AMOXICILLIN-POT CLAVULANATE 875-125 MG PO TABS
1.0000 | ORAL_TABLET | Freq: Once | ORAL | Status: AC
  Administered 2021-09-11: 1 via ORAL
  Filled 2021-09-11: qty 1

## 2021-09-11 NOTE — ED Provider Notes (Signed)
?[facility] ?Provider Note ? ? ?CSN: [id number] ?Arrival date & time: 09/11/21  1505 ? ?  ? ?History ?Chief Complaint  ?Patient presents with  ? Facial Swelling  ? ? ?Miguel Solis is a 71 y.o. male who presents to the emergency department with right sided facial swelling and an upper tooth ache that started approximately 4 days ago.  Patient denies any drainage, eye pain, changes in vision, fever, chills, sinus congestion. ? ?HPI ? ?  ? ?Home Medications ?Prior to Admission medications   ?Medication Sig Start Date End Date Taking? Authorizing Provider  ?amoxicillin-clavulanate (AUGMENTIN) 875-125 MG tablet Take 1 tablet by mouth every 12 (twelve) hours. 09/11/21  Yes Raul Del, Taygen Newsome M, PA-C  ?atorvastatin (LIPITOR) 40 MG tablet Take 1 tablet (40 mg total) by mouth daily. 12/02/20   Erven Colla, DO  ?benzonatate (TESSALON) 100 MG capsule Take 1-2 capsules (100-200 mg total) by mouth 3 (three) times daily as needed for cough. 07/06/21   Jaynee Eagles, PA-C  ?cetirizine (ZYRTEC ALLERGY) 10 MG tablet Take 1 tablet (10 mg total) by mouth daily. 07/06/21   Jaynee Eagles, PA-C  ?escitalopram (LEXAPRO) 10 MG tablet TAKE 1 TABLET BY MOUTH EVERY DAY IN THE MORNING 09/08/21   Coral Spikes, DO  ?lisinopril (ZESTRIL) 10 MG tablet Take 1 tablet (10 mg total) by mouth daily. 12/02/20   Erven Colla, DO  ?promethazine-dextromethorphan (PROMETHAZINE-DM) 6.25-15 MG/5ML syrup Take 5 mLs by mouth at bedtime as needed for cough. 07/06/21   Jaynee Eagles, PA-C  ?pseudoephedrine (SUDAFED) 30 MG tablet Take 1 tablet (30 mg total) by mouth every 8 (eight) hours as needed for congestion. 07/06/21   Jaynee Eagles, PA-C  ?   ? ?Allergies    ?Patient has no known allergies.   ? ?Review of Systems   ?Review of Systems  ?All other systems reviewed and are negative. ? ?Physical Exam ?Updated Vital Signs ?BP 119/79 (BP Location: Right Arm)   Pulse 78   Temp 99.2 ?F (37.3 ?C) (Oral)   Resp 18   Ht '5\' 10"'$  (1.778 m)   Wt 90.7 kg   SpO2  99%   BMI 28.70 kg/m?  ?Physical Exam ?Vitals and nursing note reviewed.  ?Constitutional:   ?   Appearance: Normal appearance.  ?HENT:  ?   Head: Normocephalic and atraumatic.  ?   Comments: There is mild swelling over the right maxilla facial area. ?   Mouth/Throat:  ?   Comments: Poor dentition throughout. There is evidence of a broken tooth in the right upper gumline. ?Eyes:  ?   General:     ?   Right eye: No discharge.     ?   Left eye: No discharge.  ?   Conjunctiva/sclera: Conjunctivae normal.  ?   Comments: Extraocular movements are intact and nonpainful.  No ocular entrapment.  Conjunctive a is normal bilaterally  ?Pulmonary:  ?   Effort: Pulmonary effort is normal.  ?Skin: ?   General: Skin is warm and dry.  ?   Findings: No rash.  ?Neurological:  ?   General: No focal deficit present.  ?   Mental Status: He is alert.  ?Psychiatric:     ?   Mood and Affect: Mood normal.     ?   Behavior: Behavior normal.  ? ? ?ED Results / Procedures / Treatments   ?Labs ?(all labs ordered are listed, but only abnormal results are displayed) ?Labs Reviewed - No data to  display ? ?EKG ?None ? ?Radiology ?No results found. ? ?Procedures ?Procedures  ? ? ?Medications Ordered in ED ?Medications  ?ibuprofen (ADVIL) 100 MG/5ML suspension 800 mg (800 mg Oral Given 09/11/21 1617)  ?amoxicillin-clavulanate (AUGMENTIN) 875-125 MG per tablet 1 tablet (1 tablet Oral Given 09/11/21 1616)  ? ? ?ED Course/ Medical Decision Making/ A&P ?Clinical Course as of 09/11/21 1631  ?Sat Sep 11, 2021  ?1619 I discussed this case with my attending physician who cosigned this note including patient's presenting symptoms, physical exam, and planned diagnostics and interventions. Attending physician stated agreement with plan or made changes to plan which were implemented.  ? ?Attending physician assessed patient at bedside. ? ? [CF]  ?  ?Clinical Course User Index ?[CF] Myna Bright M, PA-C  ? ?                        ?Medical Decision  Making ?Risk ?Prescription drug management. ? ? ?Miguel Solis is a 71 y.o. male who presents to the ED with facial swelling and a toothache.  Differential diagnosis includes dental abscess.  Have low suspicion for Ludwig's angina. I doubt PTA or RPA. I have a low suspicion for orbital cellulitis or preseptal cellulitis. Likely a developing dental abscess which is causing his pain and swelling. Will give Ibuprofen and Augmentin in the department with a script for Augmentin and have him follow up with his dentist. Script return precautions were given. He is safe for discharge.  ? ?Final Clinical Impression(s) / ED Diagnoses ?Final diagnoses:  ?Pain, dental  ?Facial swelling  ? ? ?Rx / DC Orders ?ED Discharge Orders   ? ?      Ordered  ?  amoxicillin-clavulanate (AUGMENTIN) 875-125 MG tablet  Every 12 hours       ? 09/11/21 1631  ? ?  ?  ? ?  ? ? ?  ?Hendricks Limes, PA-C ?09/11/21 1631 ? ?  ?Dorie Rank, MD ?09/12/21 1505 ? ?

## 2021-09-11 NOTE — Discharge Instructions (Signed)
Please take antibiotics as prescribed.  Please call your dentist on Monday for further evaluation.  You can return to the emergency department anytime for worsening symptoms.  You can take Tylenol or ibuprofen for pain and swelling. ?

## 2021-09-11 NOTE — ED Triage Notes (Signed)
Patient c/o right orbital swelling that started yesterday. Per patient slight pain. Denies any drainage or changes in vision. ? ?

## 2021-09-12 ENCOUNTER — Other Ambulatory Visit: Payer: Self-pay

## 2021-09-12 ENCOUNTER — Emergency Department (HOSPITAL_COMMUNITY): Payer: Medicare Other

## 2021-09-12 ENCOUNTER — Emergency Department (HOSPITAL_COMMUNITY)
Admission: EM | Admit: 2021-09-12 | Discharge: 2021-09-12 | Disposition: A | Discharge status: Home / Self Care | Payer: Medicare Other | Attending: Emergency Medicine | Admitting: Emergency Medicine

## 2021-09-12 ENCOUNTER — Encounter (HOSPITAL_COMMUNITY): Payer: Self-pay | Admitting: Emergency Medicine

## 2021-09-12 DIAGNOSIS — R7309 Other abnormal glucose: Secondary | ICD-10-CM | POA: Insufficient documentation

## 2021-09-12 DIAGNOSIS — R17 Unspecified jaundice: Secondary | ICD-10-CM | POA: Diagnosis not present

## 2021-09-12 DIAGNOSIS — Z8673 Personal history of transient ischemic attack (TIA), and cerebral infarction without residual deficits: Secondary | ICD-10-CM | POA: Diagnosis not present

## 2021-09-12 DIAGNOSIS — R253 Fasciculation: Secondary | ICD-10-CM | POA: Insufficient documentation

## 2021-09-12 DIAGNOSIS — D72829 Elevated white blood cell count, unspecified: Secondary | ICD-10-CM | POA: Insufficient documentation

## 2021-09-12 DIAGNOSIS — I10 Essential (primary) hypertension: Secondary | ICD-10-CM | POA: Diagnosis not present

## 2021-09-12 DIAGNOSIS — Y900 Blood alcohol level of less than 20 mg/100 ml: Secondary | ICD-10-CM | POA: Insufficient documentation

## 2021-09-12 DIAGNOSIS — Z79899 Other long term (current) drug therapy: Secondary | ICD-10-CM | POA: Diagnosis not present

## 2021-09-12 DIAGNOSIS — R251 Tremor, unspecified: Secondary | ICD-10-CM | POA: Diagnosis not present

## 2021-09-12 DIAGNOSIS — R7989 Other specified abnormal findings of blood chemistry: Secondary | ICD-10-CM | POA: Diagnosis not present

## 2021-09-12 LAB — URINALYSIS, ROUTINE W REFLEX MICROSCOPIC
Bacteria, UA: NONE SEEN
Bilirubin Urine: NEGATIVE
Glucose, UA: NEGATIVE mg/dL
Hgb urine dipstick: NEGATIVE
Ketones, ur: 5 mg/dL — AB
Leukocytes,Ua: NEGATIVE
Nitrite: NEGATIVE
Protein, ur: 100 mg/dL — AB
Specific Gravity, Urine: 1.03 (ref 1.005–1.030)
pH: 5 (ref 5.0–8.0)

## 2021-09-12 LAB — COMPREHENSIVE METABOLIC PANEL
ALT: 17 U/L (ref 0–44)
AST: 18 U/L (ref 15–41)
Albumin: 4.3 g/dL (ref 3.5–5.0)
Alkaline Phosphatase: 104 U/L (ref 38–126)
Anion gap: 13 (ref 5–15)
BUN: 13 mg/dL (ref 8–23)
CO2: 25 mmol/L (ref 22–32)
Calcium: 9.4 mg/dL (ref 8.9–10.3)
Chloride: 97 mmol/L — ABNORMAL LOW (ref 98–111)
Creatinine, Ser: 1.04 mg/dL (ref 0.61–1.24)
GFR, Estimated: >60 mL/min (ref >60)
Glucose, Bld: 161 mg/dL — ABNORMAL HIGH (ref 70–99)
Potassium: 4.1 mmol/L (ref 3.5–5.1)
Sodium: 135 mmol/L (ref 135–145)
Total Bilirubin: 1.8 mg/dL — ABNORMAL HIGH (ref 0.3–1.2)
Total Protein: 8.8 g/dL — ABNORMAL HIGH (ref 6.5–8.1)

## 2021-09-12 LAB — CBC WITH DIFFERENTIAL/PLATELET
Abs Immature Granulocytes: 0.06 10*3/uL (ref 0.00–0.07)
Basophils Absolute: 0.1 10*3/uL (ref 0.0–0.1)
Basophils Relative: 1 %
Eosinophils Absolute: 0.4 10*3/uL (ref 0.0–0.5)
Eosinophils Relative: 3 %
HCT: 51.5 % (ref 39.0–52.0)
Hemoglobin: 17 g/dL (ref 13.0–17.0)
Immature Granulocytes: 0 %
Lymphocytes Relative: 24 %
Lymphs Abs: 3.6 10*3/uL (ref 0.7–4.0)
MCH: 32.8 pg (ref 26.0–34.0)
MCHC: 33 g/dL (ref 30.0–36.0)
MCV: 99.4 fL (ref 80.0–100.0)
Monocytes Absolute: 0.8 10*3/uL (ref 0.1–1.0)
Monocytes Relative: 6 %
Neutro Abs: 9.9 10*3/uL — ABNORMAL HIGH (ref 1.7–7.7)
Neutrophils Relative %: 66 %
Platelets: 273 10*3/uL (ref 150–400)
RBC: 5.18 MIL/uL (ref 4.22–5.81)
RDW: 12.8 % (ref 11.5–15.5)
WBC: 14.9 10*3/uL — ABNORMAL HIGH (ref 4.0–10.5)
nRBC: 0 % (ref 0.0–0.2)

## 2021-09-12 LAB — MAGNESIUM: Magnesium: 2.2 mg/dL (ref 1.7–2.4)

## 2021-09-12 LAB — ETHANOL: Alcohol, Ethyl (B): <10 mg/dL (ref <10)

## 2021-09-12 NOTE — Discharge Instructions (Signed)
You were seen in the emergency department for twitching and spasm of your muscles.  Your lab work and CAT scan of your head did not show any obvious explanation of your symptoms.  Please stay well-hydrated and follow-up with your primary care doctor.  Return to the emergency department if any worsening or concerning symptoms. ?

## 2021-09-12 NOTE — ED Provider Notes (Signed)
?[facility] ?Provider Note ? ? ?CSN: [id number] ?Arrival date & time: 09/12/21  1733 ? ?  ? ?History ? ?Chief Complaint  ?Patient presents with  ? Shaking  ? ? ?Miguel Solis is a 71 y.o. male.  He has a prior history of stroke hypertension chronic alcohol use.  He was here yesterday for some facial swelling and given antibiotics for possible tooth infection.  Today he said he woke up with some twitching of his body.  It seems to happen every minute or so.  He denies any blurry vision double vision weakness numbness difficulty ambulating.  No headache.  He said he had similar type symptoms when he was found to have a stroke a few years ago.  He denies any current alcohol use. ? ?The history is provided by the patient.  ? ?  ? ?Home Medications ?Prior to Admission medications   ?Medication Sig Start Date End Date Taking? Authorizing Provider  ?amoxicillin-clavulanate (AUGMENTIN) 875-125 MG tablet Take 1 tablet by mouth every 12 (twelve) hours. 09/11/21   Hendricks Limes, PA-C  ?atorvastatin (LIPITOR) 40 MG tablet Take 1 tablet (40 mg total) by mouth daily. 12/02/20   Erven Colla, DO  ?benzonatate (TESSALON) 100 MG capsule Take 1-2 capsules (100-200 mg total) by mouth 3 (three) times daily as needed for cough. 07/06/21   Jaynee Eagles, PA-C  ?cetirizine (ZYRTEC ALLERGY) 10 MG tablet Take 1 tablet (10 mg total) by mouth daily. 07/06/21   Jaynee Eagles, PA-C  ?escitalopram (LEXAPRO) 10 MG tablet TAKE 1 TABLET BY MOUTH EVERY DAY IN THE MORNING 09/08/21   Coral Spikes, DO  ?lisinopril (ZESTRIL) 10 MG tablet Take 1 tablet (10 mg total) by mouth daily. 12/02/20   Erven Colla, DO  ?promethazine-dextromethorphan (PROMETHAZINE-DM) 6.25-15 MG/5ML syrup Take 5 mLs by mouth at bedtime as needed for cough. 07/06/21   Jaynee Eagles, PA-C  ?pseudoephedrine (SUDAFED) 30 MG tablet Take 1 tablet (30 mg total) by mouth every 8 (eight) hours as needed for congestion. 07/06/21   Jaynee Eagles, PA-C  ?   ? ?Allergies    ?Patient  has no known allergies.   ? ?Review of Systems   ?Review of Systems  ?Constitutional:  Negative for fever.  ?HENT:  Negative for sore throat.   ?Eyes:  Negative for visual disturbance.  ?Respiratory:  Negative for shortness of breath.   ?Cardiovascular:  Negative for chest pain.  ?Gastrointestinal:  Negative for abdominal pain.  ?Genitourinary:  Negative for dysuria.  ?Musculoskeletal:  Negative for gait problem.  ?Skin:  Negative for rash.  ?Neurological:  Positive for tremors. Negative for speech difficulty, weakness, numbness and headaches.  ? ?Physical Exam ?Updated Vital Signs ?BP 111/77   Pulse 81   Temp 98.1 ?F (36.7 ?C) (Oral)   Resp (!) 25   Ht '5\' 10"'$  (1.778 m)   Wt 90.7 kg   SpO2 96%   BMI 28.70 kg/m?  ?Physical Exam ?Vitals and nursing note reviewed.  ?Constitutional:   ?   General: He is not in acute distress. ?   Appearance: Normal appearance. He is well-developed.  ?HENT:  ?   Head: Normocephalic and atraumatic.  ?Eyes:  ?   Conjunctiva/sclera: Conjunctivae normal.  ?Cardiovascular:  ?   Rate and Rhythm: Normal rate and regular rhythm.  ?   Heart sounds: No murmur heard. ?Pulmonary:  ?   Effort: Pulmonary effort is normal. No respiratory distress.  ?   Breath sounds: Normal breath sounds.  ?  Abdominal:  ?   Palpations: Abdomen is soft.  ?   Tenderness: There is no abdominal tenderness.  ?Musculoskeletal:     ?   General: No swelling. Normal range of motion.  ?   Cervical back: Neck supple.  ?Skin: ?   General: Skin is warm and dry.  ?   Capillary Refill: Capillary refill takes less than 2 seconds.  ?Neurological:  ?   General: No focal deficit present.  ?   Mental Status: He is alert.  ?   Cranial Nerves: No cranial nerve deficit.  ?   Sensory: No sensory deficit.  ?   Motor: No weakness.  ?   Gait: Gait normal.  ? ? ?ED Results / Procedures / Treatments   ?Labs ?(all labs ordered are listed, but only abnormal results are displayed) ?Labs Reviewed  ?CBC WITH DIFFERENTIAL/PLATELET - Abnormal;  Notable for the following components:  ?    Result Value  ? WBC 14.9 (*)   ? Neutro Abs 9.9 (*)   ? All other components within normal limits  ?COMPREHENSIVE METABOLIC PANEL - Abnormal; Notable for the following components:  ? Chloride 97 (*)   ? Glucose, Bld 161 (*)   ? Total Protein 8.8 (*)   ? Total Bilirubin 1.8 (*)   ? All other components within normal limits  ?URINALYSIS, ROUTINE W REFLEX MICROSCOPIC - Abnormal; Notable for the following components:  ? Color, Urine AMBER (*)   ? APPearance HAZY (*)   ? Ketones, ur 5 (*)   ? Protein, ur 100 (*)   ? All other components within normal limits  ?MAGNESIUM  ?ETHANOL  ? ? ?EKG ?None ? ?Radiology ?CT Head Wo Contrast ? ?Result Date: 09/12/2021 ?CLINICAL DATA:  Shakiness, stroke suspected, history of stroke EXAM: CT HEAD WITHOUT CONTRAST TECHNIQUE: Contiguous axial images were obtained from the base of the skull through the vertex without intravenous contrast. RADIATION DOSE REDUCTION: This exam was performed according to the departmental dose-optimization program which includes automated exposure control, adjustment of the mA and/or kV according to patient size and/or use of iterative reconstruction technique. COMPARISON:  08/06/2020 FINDINGS: Brain: No evidence of acute infarction, hemorrhage, hydrocephalus, extra-axial collection or mass lesion/mass effect. Vascular: No hyperdense vessel or unexpected calcification. Skull: Normal. Negative for fracture or focal lesion. Sinuses/Orbits: No acute finding. Other: None. IMPRESSION: No acute intracranial pathology. No noncontrast CT evidence of acute stroke or hemorrhage. Electronically Signed   By: Delanna Ahmadi M.D.   On: 09/12/2021 20:48   ? ?Procedures ?Procedures  ? ? ?Medications Ordered in ED ?Medications - No data to display ? ?ED Course/ Medical Decision Making/ A&P ?Clinical Course as of 09/13/21 1053  ?Sun Sep 12, 2021  ?2257 Patient is tolerating p.o. here without any difficulty.  Does not have any focal neuro  findings.  Unclear why he is having this twitching activity but metabolically do not see any significant derangements.  Recommended close follow-up with his PCP.  Return instructions discussed [MB]  ?  ?Clinical Course User Index ?[MB] Hayden Rasmussen, MD  ? ?                        ?Medical Decision Making ?Amount and/or Complexity of Data Reviewed ?Labs: ordered. ?Radiology: ordered. ? ?This patient complains of twitching in his face; this involves an extensive number of treatment ?Options and is a complaint that carries with it a high risk of complications and ?morbidity. The differential includes  stroke, seizures, metabolic derangement, intoxication ? ?I ordered, reviewed and interpreted labs, which included CBC with elevated white count normal hemoglobin, chemistries fairly normal other than low chloride elevated glucose elevated bilirubin, urinalysis with possible signs of infection although no bacteria and no symptoms, alcohol level negative ?I ordered imaging studies which included CT head and I independently ?   visualized and interpreted imaging which showed no acute findings ?Previous records obtained and reviewed including prior ED visits ?I ?Cardiac monitoring reviewed, patient in normal sinus rhythm ?Social determinants considered, no significant barriers ?Critical Interventions: None ? ?After the interventions stated above, I reevaluated the patient and found patient to have improved symptoms. ?Admission and further testing considered, no indications for admission at this time.  Does not have any focal neurologic signs.  No clear etiology for his muscle twitching or spasm.  Electrolytes fairly unremarkable.  Recommended hydration and follow-up with his treating providers.  Return instructions discussed ? ? ? ? ? ? ? ? ? ?Final Clinical Impression(s) / ED Diagnoses ?Final diagnoses:  ?Muscle twitching  ? ? ?Rx / DC Orders ?ED Discharge Orders   ? ? None  ? ?  ? ? ?  ?Hayden Rasmussen, MD ?09/13/21  1056 ? ?

## 2021-09-12 NOTE — ED Triage Notes (Signed)
Pt states he woke up this morning around 10am and was shaking, pt states he still feels shaky now, states this happened previously when he had a stroke apprx 3 years. Denies any weakness, vision changes, headache, chest pain, SHOB.  ?

## 2021-10-07 ENCOUNTER — Other Ambulatory Visit: Payer: Self-pay | Admitting: *Deleted

## 2021-10-07 DIAGNOSIS — F32A Depression, unspecified: Secondary | ICD-10-CM

## 2021-10-07 MED ORDER — ESCITALOPRAM OXALATE 10 MG PO TABS: ORAL_TABLET | ORAL | 0 refills | Status: AC

## 2021-12-16 ENCOUNTER — Ambulatory Visit: Payer: Self-pay | Admitting: Family Medicine

## 2021-12-17 ENCOUNTER — Ambulatory Visit (INDEPENDENT_AMBULATORY_CARE_PROVIDER_SITE_OTHER): Payer: Medicare Other | Admitting: Family Medicine

## 2021-12-17 VITALS — BP 113/71 | Ht 70.0 in | Wt 189.8 lb

## 2021-12-17 DIAGNOSIS — D72829 Elevated white blood cell count, unspecified: Secondary | ICD-10-CM | POA: Diagnosis not present

## 2021-12-17 DIAGNOSIS — E785 Hyperlipidemia, unspecified: Secondary | ICD-10-CM

## 2021-12-17 DIAGNOSIS — Z8673 Personal history of transient ischemic attack (TIA), and cerebral infarction without residual deficits: Secondary | ICD-10-CM

## 2021-12-17 DIAGNOSIS — Z1159 Encounter for screening for other viral diseases: Secondary | ICD-10-CM | POA: Diagnosis not present

## 2021-12-17 DIAGNOSIS — E118 Type 2 diabetes mellitus with unspecified complications: Secondary | ICD-10-CM | POA: Diagnosis not present

## 2021-12-17 DIAGNOSIS — I10 Essential (primary) hypertension: Secondary | ICD-10-CM

## 2021-12-17 MED ORDER — ASPIRIN 81 MG PO TBEC: 81.0000 mg | DELAYED_RELEASE_TABLET | Freq: Every day | ORAL | 3 refills | Status: AC

## 2021-12-17 NOTE — Patient Instructions (Signed)
Labs.  I have added aspirin.  Continue current medications.  Follow up in 6 months.  Take care  Dr. Lacinda Axon

## 2021-12-20 NOTE — Assessment & Plan Note (Signed)
Stable.  Continue lisinopril.  Labs ordered.

## 2021-12-20 NOTE — Assessment & Plan Note (Signed)
Advised aspirin daily.  Rx sent.

## 2021-12-20 NOTE — Progress Notes (Signed)
 Subjective:  Patient ID: Miguel Solis, male    DOB: 11/30/1950  Age: 71 y.o. MRN: [id number]  CC: Chief Complaint  Patient presents with   Hypertension    Follow up- no problems or concerns per patient    HPI:  71-year-old male with a history of alcohol abuse, depression, history of stroke, history of seizure, hyperlipidemia, type 2 diabetes, hypertension presents for follow-up.  Patient reports that he is doing well.  He has no particular complaints or concerns at this time.  His hypertension is well controlled on lisinopril 10 mg daily.  Patient is in need of labs.  He is not currently taking aspirin regarding stroke prophylaxis and I am unsure why.  We will discuss this today.  His mood has been stable on Lexapro.  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   Alcohol abuse 04/03/2017   Elevated PSA 12/05/2012   Essential hypertension 11/08/2012    Social Hx   Social History   Socioeconomic History   Marital status: Married    Spouse name: Not on file   Number of children: Not on file   Years of education: Not on file   Highest education level: Not on file  Occupational History   Not on file  Tobacco Use   Smoking status: Never   Smokeless tobacco: Never  Vaping Use   Vaping Use: Never used  Substance and Sexual Activity   Alcohol use: Yes    Comment: per EMS several bottles of wine   Drug use: No   Sexual activity: Not on file  Other Topics Concern   Not on file  Social History Narrative   Not on file   Social Determinants of Health   Financial Resource Strain: Not on file  Food Insecurity: Not on file  Transportation Needs: Not on file  Physical Activity: Not on file  Stress: Not on file  Social  Connections: Not on file    Review of Systems  Constitutional: Negative.   Respiratory: Negative.    Cardiovascular: Negative.    Objective:  BP 113/71   Ht 5' 10" (1.778 m)   Wt 189 lb 12.8 oz (86.1 kg)   BMI 27.23 kg/m      12/17/2021    2:44 PM 09/12/2021   11:11 PM 09/12/2021   10:30 PM  BP/Weight  Systolic BP 113 122 137  Diastolic BP 71 73 118  Wt. (Lbs) 189.8    BMI 27.23 kg/m2     Physical Exam Vitals and nursing note reviewed.  Constitutional:      General: He is not in acute distress.    Appearance: Normal appearance. He is not ill-appearing.  HENT:     Head: Normocephalic and atraumatic.  Eyes:     General:        Right eye: No discharge.        Left eye: No discharge.     Conjunctiva/sclera: Conjunctivae normal.  Cardiovascular:     Rate and Rhythm: Normal rate and regular rhythm.  Pulmonary:     Effort: Pulmonary effort is normal.     Breath sounds: Normal breath sounds. No wheezing, rhonchi or rales.  Abdominal:     General: There is no distension.       Palpations: Abdomen is soft.     Tenderness: There is no abdominal tenderness.  Neurological:     Mental Status: He is alert.  Psychiatric:        Mood and Affect: Mood normal.        Behavior: Behavior normal.      Assessment & Plan:   Problem List Items Addressed This Visit       Cardiovascular and Mediastinum   Essential hypertension    Stable.  Continue lisinopril.  Labs ordered.      Relevant Medications   aspirin EC 81 MG tablet     Endocrine   Type 2 diabetes mellitus with complications (HCC)    A1c today to assess.  No pharmacotherapy at this time.      Relevant Medications   aspirin EC 81 MG tablet   Other Relevant Orders   CMP14+EGFR   Hemoglobin A1c     Other   History of stroke    Advised aspirin daily.  Rx sent.      Hyperlipidemia - Primary    Lipid panel ordered for reassessment.  Continue Lipitor.      Relevant Medications   aspirin EC 81 MG tablet    Other Relevant Orders   Lipid panel   Other Visit Diagnoses     Leukocytosis, unspecified type       Relevant Orders   CBC   Need for hepatitis C screening test       Relevant Orders   Hepatitis C Antibody       Meds ordered this encounter  Medications   aspirin EC 81 MG tablet    Sig: Take 1 tablet (81 mg total) by mouth daily. Swallow whole.    Dispense:  90 tablet    Refill:  3    Follow-up:  Return in about 6 months (around 06/18/2022).    DO [facility]  

## 2021-12-20 NOTE — Assessment & Plan Note (Signed)
A1c today to assess.  No pharmacotherapy at this time.

## 2021-12-20 NOTE — Assessment & Plan Note (Signed)
Lipid panel ordered for reassessment.  Continue Lipitor.

## 2023-03-29 DIAGNOSIS — I10 Essential (primary) hypertension: Secondary | ICD-10-CM | POA: Diagnosis not present
